# Patient Record
Sex: Female | Born: 1950 | Race: White | Hispanic: No | Smoking: Former smoker
Health system: Southern US, Community
[De-identification: ages and names within clinical notes are randomized; demographics above are authoritative.]

## PROBLEM LIST (undated history)

## (undated) DIAGNOSIS — H509 Unspecified strabismus: Secondary | ICD-10-CM

## (undated) DIAGNOSIS — M1990 Unspecified osteoarthritis, unspecified site: Secondary | ICD-10-CM

## (undated) DIAGNOSIS — N059 Unspecified nephritic syndrome with unspecified morphologic changes: Secondary | ICD-10-CM

## (undated) DIAGNOSIS — Z8719 Personal history of other diseases of the digestive system: Secondary | ICD-10-CM

## (undated) DIAGNOSIS — F32A Depression, unspecified: Secondary | ICD-10-CM

## (undated) DIAGNOSIS — F419 Anxiety disorder, unspecified: Secondary | ICD-10-CM

## (undated) DIAGNOSIS — Z973 Presence of spectacles and contact lenses: Secondary | ICD-10-CM

## (undated) DIAGNOSIS — Z803 Family history of malignant neoplasm of breast: Secondary | ICD-10-CM

## (undated) DIAGNOSIS — M545 Low back pain, unspecified: Secondary | ICD-10-CM

## (undated) DIAGNOSIS — K219 Gastro-esophageal reflux disease without esophagitis: Secondary | ICD-10-CM

## (undated) DIAGNOSIS — B009 Herpesviral infection, unspecified: Secondary | ICD-10-CM

## (undated) DIAGNOSIS — N390 Urinary tract infection, site not specified: Secondary | ICD-10-CM

## (undated) DIAGNOSIS — F329 Major depressive disorder, single episode, unspecified: Secondary | ICD-10-CM

## (undated) DIAGNOSIS — C4492 Squamous cell carcinoma of skin, unspecified: Secondary | ICD-10-CM

## (undated) DIAGNOSIS — G8929 Other chronic pain: Secondary | ICD-10-CM

## (undated) DIAGNOSIS — M810 Age-related osteoporosis without current pathological fracture: Secondary | ICD-10-CM

## (undated) DIAGNOSIS — H3530 Unspecified macular degeneration: Secondary | ICD-10-CM

## (undated) DIAGNOSIS — C50911 Malignant neoplasm of unspecified site of right female breast: Secondary | ICD-10-CM

## (undated) HISTORY — DX: Family history of malignant neoplasm of breast: Z80.3

## (undated) HISTORY — PX: DILATION AND CURETTAGE OF UTERUS: SHX78

## (undated) HISTORY — DX: Major depressive disorder, single episode, unspecified: F32.9

## (undated) HISTORY — DX: Unspecified macular degeneration: H35.30

## (undated) HISTORY — DX: Age-related osteoporosis without current pathological fracture: M81.0

## (undated) HISTORY — DX: Gastro-esophageal reflux disease without esophagitis: K21.9

## (undated) HISTORY — DX: Unspecified strabismus: H50.9

## (undated) HISTORY — PX: TONSILLECTOMY AND ADENOIDECTOMY: SUR1326

## (undated) HISTORY — DX: Anxiety disorder, unspecified: F41.9

## (undated) HISTORY — PX: UPPER GI ENDOSCOPY: SHX6162

## (undated) HISTORY — DX: Unspecified osteoarthritis, unspecified site: M19.90

## (undated) HISTORY — PX: SQUAMOUS CELL CARCINOMA EXCISION: SHX2433

## (undated) HISTORY — PX: CATARACT EXTRACTION W/ INTRAOCULAR LENS  IMPLANT, BILATERAL: SHX1307

## (undated) HISTORY — PX: WISDOM TOOTH EXTRACTION: SHX21

## (undated) HISTORY — PX: COLONOSCOPY: SHX174

## (undated) HISTORY — DX: Depression, unspecified: F32.A

## (undated) HISTORY — PX: EYE SURGERY: SHX253

---

## 1998-01-05 ENCOUNTER — Other Ambulatory Visit: Admission: RE | Admit: 1998-01-05 | Discharge: 1998-01-05 | Payer: Self-pay | Admitting: Obstetrics and Gynecology

## 1999-09-08 ENCOUNTER — Other Ambulatory Visit: Admission: RE | Admit: 1999-09-08 | Discharge: 1999-09-08 | Payer: Self-pay | Admitting: Obstetrics and Gynecology

## 2000-12-25 ENCOUNTER — Other Ambulatory Visit: Admission: RE | Admit: 2000-12-25 | Discharge: 2000-12-25 | Payer: Self-pay | Admitting: Obstetrics and Gynecology

## 2000-12-27 ENCOUNTER — Encounter: Payer: Self-pay | Admitting: Obstetrics and Gynecology

## 2000-12-27 ENCOUNTER — Encounter: Admission: RE | Admit: 2000-12-27 | Discharge: 2000-12-27 | Payer: Self-pay | Admitting: Obstetrics and Gynecology

## 2005-01-15 ENCOUNTER — Other Ambulatory Visit: Admission: RE | Admit: 2005-01-15 | Discharge: 2005-01-15 | Payer: Self-pay | Admitting: Obstetrics and Gynecology

## 2005-02-16 ENCOUNTER — Encounter: Admission: RE | Admit: 2005-02-16 | Discharge: 2005-02-16 | Payer: Self-pay | Admitting: Obstetrics and Gynecology

## 2005-02-26 ENCOUNTER — Encounter: Admission: RE | Admit: 2005-02-26 | Discharge: 2005-02-26 | Payer: Self-pay | Admitting: Obstetrics and Gynecology

## 2007-02-05 ENCOUNTER — Encounter (INDEPENDENT_AMBULATORY_CARE_PROVIDER_SITE_OTHER): Payer: Self-pay | Admitting: Gastroenterology

## 2007-02-05 ENCOUNTER — Ambulatory Visit (HOSPITAL_COMMUNITY): Admission: RE | Admit: 2007-02-05 | Discharge: 2007-02-05 | Payer: Self-pay | Admitting: Gastroenterology

## 2010-03-08 ENCOUNTER — Encounter: Admission: RE | Admit: 2010-03-08 | Discharge: 2010-03-08 | Payer: Self-pay | Admitting: Obstetrics and Gynecology

## 2010-08-06 ENCOUNTER — Encounter: Payer: Self-pay | Admitting: Obstetrics and Gynecology

## 2010-11-28 NOTE — Op Note (Signed)
NAMEANALLELY, ROSELL            ACCOUNT NO.:  [id number]   MEDICAL RECORD NO.:  [id number]          PATIENT TYPE:  AMB   LOCATION:  ENDO                         FACILITY:  [facility]   PHYSICIAN:  Anselmo Rod, M.D.  DATE OF BIRTH:  02-05-1951   DATE OF PROCEDURE:  02/05/2007  DATE OF DISCHARGE:                               OPERATIVE REPORT   PROCEDURE PERFORMED:  Colonoscopy with snare polypectomy x 2.   ENDOSCOPIST:  Anselmo Rod.   INSTRUMENT USED:  Pentax video colonoscope.   INDICATIONS FOR PROCEDURE:  A 60 year old female undergoing a screening  colonoscopy to rule out colonic polyps, masses, etc.   PREPROCEDURE PREPARATION:  Informed consent was procured from the  patient. The patient fasted for 8 hours prior to the procedure and  prepped with a bottle of magnesium citrate and gallon of NuLYTELY the  night prior to the procedure.  Risks and benefits of the procedure  including a 10% miss rate of cancer and polyps were discussed with the  patient as well.   PREPROCEDURE PHYSICAL:  The patient had stable vital signs.  NECK:  Supple.  CHEST:  Clear to auscultation.  S1/S2 regular.  ABDOMEN:  Soft with normal bowel sounds.   DESCRIPTION OF PROCEDURE:  The patient was placed in the left lateral  decubitus position and sedated with 100 mcg of Fentanyl and 8 mg of  Versed given intravenously in slow incremental doses. Once the patient  was adequately sedated and maintained on low-flow oxygen and continuous  cardiac monitoring, the Pentax video colonoscope was advanced from the  rectum to the cecum.  Two small sessile polyps were removed by hot snare  and one by cold biopsy from the rectum and rectosigmoid colon.  Small  internal hemorrhoids were seen.  The rest exam was unremarkable. The  left colon, transverse colon, right colon, cecum and terminal ileum  appeared normal.   IMPRESSION:  1. Three small sessile polyps removed by hot snare from the rectum,      one  polyp removed by cold biopsies x 2.  2. Small internal hemorrhoids.  3. Otherwise normal exam to the terminal ileum.   RECOMMENDATIONS:  1. Await pathology results.  2. Avoid all nonsteroidals including aspirin for the next 2 weeks.  3. Continue using Colace 200 mg at bedtime for constipation along with      MiraLax 17 grams mixed in water 1-2 times per day if the Colace      does not help.  4. Align 4 mg per day is advised on a daily basis.  5. Outpatient follow-up as need arises in the future.  Repeat      colonoscopy will be planned depending on pathology results.      Anselmo Rod, M.D.  Electronically Signed     JNM/MEDQ  D:  02/05/2007  T:  02/06/2007  Job:  [id number]   cc:   Marcelino Duster L. Vincente Poli, M.D.  Fax: [phone]   Tammy R. Collins [place], M.D.  Fax: [phone]

## 2012-05-09 ENCOUNTER — Other Ambulatory Visit: Payer: Self-pay | Admitting: Obstetrics and Gynecology

## 2013-02-16 ENCOUNTER — Encounter (INDEPENDENT_AMBULATORY_CARE_PROVIDER_SITE_OTHER): Payer: Self-pay | Admitting: Ophthalmology

## 2013-03-24 ENCOUNTER — Encounter (INDEPENDENT_AMBULATORY_CARE_PROVIDER_SITE_OTHER): Payer: BC Managed Care – PPO | Admitting: Ophthalmology

## 2013-03-24 DIAGNOSIS — H43819 Vitreous degeneration, unspecified eye: Secondary | ICD-10-CM

## 2013-03-24 DIAGNOSIS — H3530 Unspecified macular degeneration: Secondary | ICD-10-CM

## 2013-03-24 DIAGNOSIS — H35439 Paving stone degeneration of retina, unspecified eye: Secondary | ICD-10-CM

## 2013-04-28 ENCOUNTER — Other Ambulatory Visit: Payer: Self-pay | Admitting: Gastroenterology

## 2013-04-28 DIAGNOSIS — R1310 Dysphagia, unspecified: Secondary | ICD-10-CM

## 2013-05-13 ENCOUNTER — Ambulatory Visit
Admission: RE | Admit: 2013-05-13 | Discharge: 2013-05-13 | Disposition: A | Discharge status: Home / Self Care | Payer: BC Managed Care – PPO | Source: Ambulatory Visit | Attending: Gastroenterology | Admitting: Gastroenterology

## 2013-05-13 DIAGNOSIS — R1310 Dysphagia, unspecified: Secondary | ICD-10-CM

## 2013-05-25 ENCOUNTER — Other Ambulatory Visit: Payer: Self-pay | Admitting: Obstetrics and Gynecology

## 2013-12-22 ENCOUNTER — Ambulatory Visit (INDEPENDENT_AMBULATORY_CARE_PROVIDER_SITE_OTHER): Payer: BC Managed Care – PPO | Admitting: Surgery

## 2013-12-22 ENCOUNTER — Encounter (INDEPENDENT_AMBULATORY_CARE_PROVIDER_SITE_OTHER): Payer: Self-pay | Admitting: Surgery

## 2013-12-22 VITALS — BP 130/76 | HR 84 | Temp 97.8°F | Ht 64.0 in | Wt 120.0 lb

## 2013-12-22 DIAGNOSIS — L723 Sebaceous cyst: Secondary | ICD-10-CM

## 2013-12-22 DIAGNOSIS — L089 Local infection of the skin and subcutaneous tissue, unspecified: Secondary | ICD-10-CM | POA: Insufficient documentation

## 2013-12-22 NOTE — Progress Notes (Signed)
Chief Complaint:  Infected sebaceous cyst of the left anterior neck  History of Present Illness:  Lisa Yoder is an 63 y.o. female who has had a visible palpable sebaceous cyst of the anterior neck.  This is then attempted to be removed by dermatology on 2 occasions. It is not fixed to the underlying tissue. It is somewhat scarred and required an ellipse. I will probably do this under MAC with local.  Past Medical History  Diagnosis Date  . GERD (gastroesophageal reflux disease)   . Osteoporosis     Past Surgical History  Procedure Laterality Date  . Cesarean section    . Tonsillectomy and adenoidectomy      Current Outpatient Prescriptions  Medication Sig Dispense Refill  . PARoxetine (PAXIL) 10 MG tablet       . sulfamethoxazole-trimethoprim (BACTRIM DS) 800-160 MG per tablet       . temazepam (RESTORIL) 30 MG capsule        No current facility-administered medications for this visit.   Review of patient's allergies indicates no known allergies. Family History  Problem Relation Age of Onset  . Cancer Mother     breast   Social History:   reports that she quit smoking about 38 years ago. Her smoking use included Cigarettes. She smoked 0.00 packs per day. She does not have any smokeless tobacco history on file. She reports that she drinks alcohol. She reports that she does not use illicit drugs.   REVIEW OF SYSTEMS - PERTINENT POSITIVES ONLY: noncontributory  Physical Exam:   Blood pressure 130/76, pulse 84, temperature 97.8 F (36.6 C), height 5\' 4"  (1.626 m), weight 120 lb (54.432 kg). Body mass index is 20.59 kg/(m^2).  Gen:  WDWN white female NAD  Neurological: Alert and oriented to person, place, and time. Motor and sensory function is grossly intact  Head: Normocephalic and atraumatic.  Eyes: Conjunctivae are normal. Pupils are equal, round, and reactive to light. No scleral icterus.  Neck: Normal range of motion. Neck supple. No tracheal deviation or  thyromegaly present. there is a 1 cm nodule in the left neck along the crease that appears to be a partially drained chronically inflamed sebaceous cyst. Skin: Skin is warm and dry. No rash noted. No diaphoresis. No erythema. No pallor. Pscyh: Normal mood and affect. Behavior is normal. Judgment and thought content normal.   LABORATORY RESULTS: No results found for this or any previous visit (from the past 48 hour(s)).  RADIOLOGY RESULTS: No results found.  Problem List: Patient Active Problem List   Diagnosis Date Noted  . 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 Patient currently taking Bactrim. We'll schedule a cone day surgery under local MAC excision of sebaceous cyst.    Matt B. Hassell Done, MD, [facility] [phone] beeper [phone]  12/22/2013 3:26 PM

## 2013-12-22 NOTE — Patient Instructions (Signed)

## 2013-12-28 ENCOUNTER — Inpatient Hospital Stay (HOSPITAL_COMMUNITY)
Admission: EM | Admit: 2013-12-28 | Discharge: 2013-12-31 | DRG: 607 | Disposition: A | Discharge status: Home / Self Care | Payer: BC Managed Care – PPO | Attending: Internal Medicine | Admitting: Internal Medicine

## 2013-12-28 ENCOUNTER — Encounter (HOSPITAL_COMMUNITY): Payer: Self-pay | Admitting: Emergency Medicine

## 2013-12-28 DIAGNOSIS — R300 Dysuria: Secondary | ICD-10-CM | POA: Diagnosis present

## 2013-12-28 DIAGNOSIS — Z79899 Other long term (current) drug therapy: Secondary | ICD-10-CM | POA: Diagnosis not present

## 2013-12-28 DIAGNOSIS — R21 Rash and other nonspecific skin eruption: Secondary | ICD-10-CM | POA: Diagnosis present

## 2013-12-28 DIAGNOSIS — N179 Acute kidney failure, unspecified: Secondary | ICD-10-CM | POA: Diagnosis present

## 2013-12-28 DIAGNOSIS — K219 Gastro-esophageal reflux disease without esophagitis: Secondary | ICD-10-CM | POA: Diagnosis present

## 2013-12-28 DIAGNOSIS — Z882 Allergy status to sulfonamides status: Secondary | ICD-10-CM | POA: Diagnosis not present

## 2013-12-28 DIAGNOSIS — Z87891 Personal history of nicotine dependence: Secondary | ICD-10-CM | POA: Diagnosis not present

## 2013-12-28 DIAGNOSIS — M810 Age-related osteoporosis without current pathological fracture: Secondary | ICD-10-CM | POA: Diagnosis present

## 2013-12-28 DIAGNOSIS — Y92009 Unspecified place in unspecified non-institutional (private) residence as the place of occurrence of the external cause: Secondary | ICD-10-CM | POA: Diagnosis not present

## 2013-12-28 DIAGNOSIS — L519 Erythema multiforme, unspecified: Secondary | ICD-10-CM

## 2013-12-28 DIAGNOSIS — L270 Generalized skin eruption due to drugs and medicaments taken internally: Secondary | ICD-10-CM | POA: Diagnosis present

## 2013-12-28 DIAGNOSIS — R350 Frequency of micturition: Secondary | ICD-10-CM | POA: Diagnosis present

## 2013-12-28 DIAGNOSIS — L0211 Cutaneous abscess of neck: Secondary | ICD-10-CM | POA: Diagnosis present

## 2013-12-28 DIAGNOSIS — L723 Sebaceous cyst: Secondary | ICD-10-CM

## 2013-12-28 DIAGNOSIS — L03221 Cellulitis of neck: Secondary | ICD-10-CM

## 2013-12-28 DIAGNOSIS — T370X5A Adverse effect of sulfonamides, initial encounter: Secondary | ICD-10-CM | POA: Diagnosis present

## 2013-12-28 DIAGNOSIS — L089 Local infection of the skin and subcutaneous tissue, unspecified: Secondary | ICD-10-CM

## 2013-12-28 LAB — CBC WITH DIFFERENTIAL/PLATELET
BASOS PCT: 0 % (ref 0–1)
Basophils Absolute: 0 10*3/uL (ref 0.0–0.1)
Eosinophils Absolute: 0.2 10*3/uL (ref 0.0–0.7)
Eosinophils Relative: 2 % (ref 0–5)
HCT: 43.8 % (ref 36.0–46.0)
Hemoglobin: 14.6 g/dL (ref 12.0–15.0)
LYMPHS ABS: 0.7 10*3/uL (ref 0.7–4.0)
LYMPHS PCT: 6 % — AB (ref 12–46)
MCH: 29.8 pg (ref 26.0–34.0)
MCHC: 33.3 g/dL (ref 30.0–36.0)
MCV: 89.4 fL (ref 78.0–100.0)
Monocytes Absolute: 0.2 10*3/uL (ref 0.1–1.0)
Monocytes Relative: 2 % — ABNORMAL LOW (ref 3–12)
NEUTROS PCT: 90 % — AB (ref 43–77)
Neutro Abs: 11.4 10*3/uL — ABNORMAL HIGH (ref 1.7–7.7)
PLATELETS: 170 10*3/uL (ref 150–400)
RBC: 4.9 MIL/uL (ref 3.87–5.11)
RDW: 12.8 % (ref 11.5–15.5)
WBC: 12.6 10*3/uL — ABNORMAL HIGH (ref 4.0–10.5)

## 2013-12-28 LAB — COMPREHENSIVE METABOLIC PANEL
ALBUMIN: 3.7 g/dL (ref 3.5–5.2)
ALT: 31 U/L (ref 0–35)
AST: 32 U/L (ref 0–37)
Alkaline Phosphatase: 76 U/L (ref 39–117)
BUN: 26 mg/dL — ABNORMAL HIGH (ref 6–23)
CO2: 26 mEq/L (ref 19–32)
Calcium: 9.4 mg/dL (ref 8.4–10.5)
Chloride: 95 mEq/L — ABNORMAL LOW (ref 96–112)
Creatinine, Ser: 1.72 mg/dL — ABNORMAL HIGH (ref 0.50–1.10)
GFR calc Af Amer: 35 mL/min — ABNORMAL LOW (ref >90)
GFR calc non Af Amer: 30 mL/min — ABNORMAL LOW (ref >90)
GLUCOSE: 146 mg/dL — AB (ref 70–99)
Potassium: 4.1 mEq/L (ref 3.7–5.3)
Sodium: 135 mEq/L — ABNORMAL LOW (ref 137–147)
Total Bilirubin: 0.5 mg/dL (ref 0.3–1.2)
Total Protein: 7.6 g/dL (ref 6.0–8.3)

## 2013-12-28 LAB — APTT: aPTT: 38 seconds — ABNORMAL HIGH (ref 24–37)

## 2013-12-28 LAB — I-STAT CG4 LACTIC ACID, ED: LACTIC ACID, VENOUS: 2.69 mmol/L — AB (ref 0.5–2.2)

## 2013-12-28 LAB — PROTIME-INR
INR: 1.27 (ref 0.00–1.49)
Prothrombin Time: 15.6 seconds — ABNORMAL HIGH (ref 11.6–15.2)

## 2013-12-28 MED ORDER — ACETAMINOPHEN 325 MG PO TABS
650.0000 mg | ORAL_TABLET | Freq: Once | ORAL | Status: AC
  Administered 2013-12-28: 650 mg via ORAL
  Filled 2013-12-28: qty 2

## 2013-12-28 MED ORDER — HYDROMORPHONE HCL PF 1 MG/ML IJ SOLN: 1.0000 mg | INTRAMUSCULAR | Status: DC | PRN

## 2013-12-28 MED ORDER — FAMOTIDINE IN NACL 20-0.9 MG/50ML-% IV SOLN
20.0000 mg | Freq: Once | INTRAVENOUS | Status: AC
  Administered 2013-12-28: 20 mg via INTRAVENOUS
  Filled 2013-12-28: qty 50

## 2013-12-28 MED ORDER — OXYCODONE HCL 5 MG PO TABS
5.0000 mg | ORAL_TABLET | ORAL | Status: DC | PRN
  Administered 2013-12-28: 5 mg via ORAL
  Filled 2013-12-28: qty 1

## 2013-12-28 MED ORDER — FAMOTIDINE IN NACL 20-0.9 MG/50ML-% IV SOLN
20.0000 mg | Freq: Two times a day (BID) | INTRAVENOUS | Status: DC
  Administered 2013-12-28 – 2013-12-30 (×6): 20 mg via INTRAVENOUS
  Filled 2013-12-28 (×9): qty 50

## 2013-12-28 MED ORDER — EPINEPHRINE 0.3 MG/0.3ML IJ SOAJ
0.3000 mg | Freq: Once | INTRAMUSCULAR | Status: AC
  Administered 2013-12-28: 0.3 mg via INTRAMUSCULAR

## 2013-12-28 MED ORDER — SODIUM CHLORIDE 0.9 % IV BOLUS (SEPSIS)
1000.0000 mL | Freq: Once | INTRAVENOUS | Status: AC
  Administered 2013-12-28: 1000 mL via INTRAVENOUS

## 2013-12-28 MED ORDER — BIOTENE DRY MOUTH MT LIQD
15.0000 mL | Freq: Two times a day (BID) | OROMUCOSAL | Status: DC
  Administered 2013-12-28 – 2013-12-30 (×5): 15 mL via OROMUCOSAL

## 2013-12-28 MED ORDER — CHLORHEXIDINE GLUCONATE 0.12 % MT SOLN
15.0000 mL | Freq: Two times a day (BID) | OROMUCOSAL | Status: DC
Start: 2013-12-28 — End: 2013-12-31
  Administered 2013-12-28 – 2013-12-30 (×5): 15 mL via OROMUCOSAL
  Filled 2013-12-28 (×8): qty 15

## 2013-12-28 MED ORDER — METHYLPREDNISOLONE SODIUM SUCC 125 MG IJ SOLR
125.0000 mg | Freq: Once | INTRAMUSCULAR | Status: AC
  Administered 2013-12-28: 125 mg via INTRAVENOUS
  Filled 2013-12-28: qty 2

## 2013-12-28 MED ORDER — SODIUM CHLORIDE 0.9 % IV SOLN
Freq: Once | INTRAVENOUS | Status: AC
  Administered 2013-12-28: 11:00:00 via INTRAVENOUS

## 2013-12-28 MED ORDER — ONDANSETRON HCL 4 MG/2ML IJ SOLN: 4.0000 mg | Freq: Four times a day (QID) | INTRAMUSCULAR | Status: DC | PRN

## 2013-12-28 MED ORDER — METHYLPREDNISOLONE SODIUM SUCC 125 MG IJ SOLR: 60.0000 mg | Freq: Two times a day (BID) | INTRAMUSCULAR | Status: DC

## 2013-12-28 MED ORDER — ONDANSETRON HCL 4 MG PO TABS: 4.0000 mg | ORAL_TABLET | Freq: Four times a day (QID) | ORAL | Status: DC | PRN

## 2013-12-28 MED ORDER — SODIUM CHLORIDE 0.9 % IV SOLN
INTRAVENOUS | Status: DC
  Administered 2013-12-28: 13:00:00 via INTRAVENOUS
  Administered 2013-12-29: 1000 mL via INTRAVENOUS
  Administered 2013-12-29 (×2): via INTRAVENOUS

## 2013-12-28 MED ORDER — DIPHENHYDRAMINE HCL 50 MG/ML IJ SOLN
25.0000 mg | Freq: Once | INTRAMUSCULAR | Status: AC
  Administered 2013-12-28: 25 mg via INTRAVENOUS
  Filled 2013-12-28: qty 1

## 2013-12-28 MED ORDER — ENOXAPARIN SODIUM 30 MG/0.3ML ~~LOC~~ SOLN
30.0000 mg | SUBCUTANEOUS | Status: DC
Start: 2013-12-28 — End: 2013-12-29
  Administered 2013-12-28: 30 mg via SUBCUTANEOUS
  Filled 2013-12-28 (×2): qty 0.3

## 2013-12-28 MED ORDER — VANCOMYCIN HCL 500 MG IV SOLR
500.0000 mg | INTRAVENOUS | Status: DC
  Administered 2013-12-28: 500 mg via INTRAVENOUS
  Filled 2013-12-28: qty 500

## 2013-12-28 MED ORDER — DIPHENHYDRAMINE HCL 50 MG/ML IJ SOLN
25.0000 mg | Freq: Four times a day (QID) | INTRAMUSCULAR | Status: DC | PRN
  Administered 2013-12-28 (×2): 25 mg via INTRAVENOUS
  Filled 2013-12-28 (×2): qty 1

## 2013-12-28 MED ORDER — METHYLPREDNISOLONE SODIUM SUCC 125 MG IJ SOLR
80.0000 mg | Freq: Four times a day (QID) | INTRAMUSCULAR | Status: DC
  Administered 2013-12-28 – 2013-12-29 (×5): 80 mg via INTRAVENOUS
  Filled 2013-12-28 (×8): qty 1.28

## 2013-12-28 NOTE — Progress Notes (Signed)
ANTIBIOTIC CONSULT NOTE - INITIAL  Pharmacy Consult for vancomycin Indication: abscess/wound infection  No Known Allergies  Patient Measurements: Height: 5\' 4"  (162.6 cm) Weight: 119 lb 14.9 oz (54.4 kg) IBW/kg (Calculated) : 54.7  Vital Signs: Temp: 98.4 F (36.9 C) (06/15 1225) Temp src: Oral (06/15 1225) BP: 105/95 mmHg (06/15 1206) Pulse Rate: 92 (06/15 1206) Intake/Output from previous day:   Intake/Output from this shift: Total I/O In: 1000 [I.V.:1000] Out: -   Labs:  Recent Labs  12/28/13 1022  WBC 12.6*  HGB 14.6  PLT 170  CREATININE 1.72*   Estimated Creatinine Clearance: 28 ml/min (by C-G formula based on Cr of 1.72). No results found for this basename: VANCOTROUGH, VANCOPEAK, VANCORANDOM, GENTTROUGH, GENTPEAK, GENTRANDOM, TOBRATROUGH, TOBRAPEAK, TOBRARND, AMIKACINPEAK, AMIKACINTROU, AMIKACIN,  in the last 72 hours   Microbiology: No results found for this or any previous visit (from the past 720 hour(s)).  Medical History: Past Medical History  Diagnosis Date  . GERD (gastroesophageal reflux disease)   . Osteoporosis     Medications:  Scheduled:  . antiseptic oral rinse  15 mL Mouth Rinse q12n4p  . chlorhexidine  15 mL Mouth Rinse BID  . enoxaparin (LOVENOX) injection  30 mg Subcutaneous Q24H  . famotidine (PEPCID) IV  20 mg Intravenous Q12H  . methylPREDNISolone (SOLU-MEDROL) injection  80 mg Intravenous Q6H   Infusions:  . sodium chloride     Assessment: 63 yo with recent Bactrim (last dose 6/14 AM) use due to abscess on neck but developed rash yesterday that has worsened overnight despite Benadryl/HC cream. Patient has maculopapular rash in upper extremities and rash with target lesions on lower extremities with areas of petechiae - concern for TEN/erythema multiform or early Stevens-Johnson syndrome  Tmax 100.3  WBC 12.6  SCr 1.72 with estimated CrCl of 28 ml/min  Goal of Therapy:  Vancomycin trough level 10-15 mcg/ml  Plan:  1)  Vancomycin 500mg  IV q24 2) Will check vanc trough at steady state as necessary  Adrian Saran, PharmD, BCPS Pager [phone] 12/28/2013 1:11 PM

## 2013-12-28 NOTE — H&P (Addendum)
[facility] History and Physical  Lisa Yoder RFX:[id number] DOB: 10/09/1950 DOA: 12/28/2013  Referring physician: ED physician PCP: Florina Ou, MD   Chief Complaint: diffuse rash   HPI:  Pt is 63 yo female who presented to [facility] ED with main concern of one day duration of progressively worsening diffuse body rash, associated with itching, hives, fever, discomfort, restlessness. Pt explains she was treated with ABX Bactrim for neck abscess 4 days prior to this admission and has actually felt better but suddenly developed uncomfortable rash, which now involves face, neck, entire body. Pt denies any known allergies, no similar events in the past. She took last ABX yesterday AM.  In ED, pt noted to have severe rash with target like lesions. Physical exam findings worrisome for acute drug reaction adn [facility] asked to admit for further evaluation.   Assessment and Plan: Active Problems: Acute drug reaction, skin rash - admit to medical floor - start with providing supportive care with IVF, analgesia, antihistamines, steroids - also ensure documented allergy to bactrim, sulfa - keep meds IV for now Neck abscess - site looks good, clean with mild surrounding erythema - place on Vancomycin for now  Acute renal failure - likely pre renal - provide IVF and repeat BMP in AM Leukocytosis - from acute illness - repeat CBC in AM  Radiological Exams on Admission: No results found.  Code Status: Full Family Communication: Pt and husband at bedside Disposition Plan: Admit for further evaluation     Review of Systems:  Constitutional:  Negative for diaphoresis.  HENT: Negative for hearing loss, ear pain, nosebleeds, congestion, sore throat, neck pain, tinnitus and ear discharge.   Eyes: Negative for blurred vision, double vision, photophobia, pain, discharge and redness.  Respiratory: Negative for cough, hemoptysis, sputum production, shortness of breath, wheezing and stridor.    Cardiovascular: Negative for chest pain, palpitations, orthopnea, claudication and leg swelling.  Gastrointestinal:  Negative for heartburn, constipation, blood in stool and melena.  Genitourinary: Negative for dysuria, urgency, frequency, hematuria and flank pain.  Musculoskeletal: Negative for myalgias, back pain, joint pain and falls.  Skin: Per HPI  Neurological: Negative for sensory change, speech change, focal weakness, loss of consciousness and headaches.  Endo/Heme/Allergies: Negative for environmental allergies and polydipsia. Does not bruise/bleed easily.  Psychiatric/Behavioral: Negative for suicidal ideas. The patient is not nervous/anxious.      Past Medical History  Diagnosis Date  . GERD (gastroesophageal reflux disease)   . Osteoporosis     Past Surgical History  Procedure Laterality Date  . Cesarean section    . Tonsillectomy and adenoidectomy      Social History:  reports that she quit smoking about 38 years ago. Her smoking use included Cigarettes. She smoked 0.00 packs per day. She has never used smokeless tobacco. She reports that she drinks alcohol. She reports that she does not use illicit drugs.  No Known Allergies  Family History  Problem Relation Age of Onset  . Cancer Mother     breast    Prior to Admission medications   Medication Sig Start Date End Date Taking? Authorizing Provider  Calcium Carbonate-Vitamin D (CALCIUM PLUS VITAMIN D PO) Take by mouth.   Yes Historical Provider, MD  Cholecalciferol (VITAMIN D PO) Take by mouth.   Yes Historical Provider, MD  Digestive Enzymes (DIGESTIVE ENZYME PO) Take by mouth.   Yes Historical Provider, MD  Multiple Vitamins-Minerals (PRESERVISION AREDS) CAPS Take 1 capsule by mouth daily.   Yes Historical Provider, MD  Omega-3 Fatty Acids (OMEGA 3 PO) Take by mouth.   Yes Historical Provider, MD  OVER THE COUNTER MEDICATION "Progesterone--OTC."   Yes Historical Provider, MD  PARoxetine (PAXIL) 10 MG tablet  Take 10 mg by mouth daily.  10/14/13  Yes Historical Provider, MD  sulfamethoxazole-trimethoprim (BACTRIM DS) 800-160 MG per tablet Take 1 tablet by mouth 2 (two) times daily. For 7 days. 12/21/13  Yes Historical Provider, MD  temazepam (RESTORIL) 30 MG capsule Take 30 mg by mouth at bedtime as needed for sleep.  12/21/13  Yes Historical Provider, MD    Physical Exam: Filed Vitals:   12/28/13 1030 12/28/13 1045 12/28/13 1100 12/28/13 1115  BP: 115/89 115/66 117/85 116/66  Pulse:   96 97  Temp:      TempSrc:      Resp: 13 14 14 23   SpO2:  100% 97% 99%    Physical Exam  Constitutional: Appears well-developed and well-nourished. No distress.  HENT: Normocephalic. External right and left ear normal. Dry MM Eyes: Conjunctivae and EOM are normal. PERRLA, no scleral icterus.  Neck: Normal ROM. Neck supple. No JVD. No tracheal deviation. No thyromegaly.  CVS: RRR, S1/S2 +, no murmurs, no gallops, no carotid bruit.  Pulmonary: Effort and breath sounds normal, no stridor, rhonchi, wheezes, rales.  Abdominal: Soft. BS +,  no distension, tenderness, rebound or guarding.  Musculoskeletal: Normal range of motion. No edema and no tenderness.  Lymphadenopathy: No lymphadenopathy noted, cervical, inguinal. Neuro: Alert. Normal reflexes, muscle tone coordination. No cranial nerve deficit. Skin: Diffuse macular rash, with target like lesions in the abdominal area, no blisters, no discharge noted Psychiatric: Normal mood and affect. Behavior, judgment, thought content normal.   Labs on Admission:  Basic Metabolic Panel:  Recent Labs Lab 12/28/13 1022  NA 135*  K 4.1  CL 95*  CO2 26  GLUCOSE 146*  BUN 26*  CREATININE 1.72*  CALCIUM 9.4   Liver Function Tests:  Recent Labs Lab 12/28/13 1022  AST 32  ALT 31  ALKPHOS 76  BILITOT 0.5  PROT 7.6  ALBUMIN 3.7   CBC:  Recent Labs Lab 12/28/13 1022  WBC 12.6*  NEUTROABS 11.4*  HGB 14.6  HCT 43.8  MCV 89.4  PLT 170   EKG: Normal sinus  rhythm, no ST/T wave changes  Faye Ramsay, MD  [facility] Pager [phone]  If 7PM-7AM, please contact night-coverage [email or web address] Password [facility] 12/28/2013, 11:41 AM

## 2013-12-28 NOTE — ED Provider Notes (Addendum)
CSN: [id number]     Arrival date & time 12/28/13  1008 History   First MD Initiated Contact with Patient 12/28/13 1017     Chief Complaint  Patient presents with  . Allergic Reaction     (Consider location/radiation/quality/duration/timing/severity/associated sxs/prior Treatment) Patient is a 63 y.o. female presenting with rash. The history is provided by the patient.  Rash Location:  Full body Quality: itchiness, redness and swelling   Severity:  Severe Onset quality:  Gradual Duration:  1 day Timing:  Constant Progression:  Worsening Chronicity:  New Context comment:  On bactrim for the last 4 days for neck abscess Relieved by:  Antihistamines, anti-itch cream and topical steroids Worsened by:  Nothing tried Ineffective treatments:  Topical steroids, antihistamines and anti-itch cream Associated symptoms: fever, headaches and periorbital edema   Associated symptoms: no hoarse voice, no shortness of breath, no tongue swelling and not wheezing   Associated symptoms comment:  Nausea. Feels like a lump in the throat   Past Medical History  Diagnosis Date  . GERD (gastroesophageal reflux disease)   . Osteoporosis    Past Surgical History  Procedure Laterality Date  . Cesarean section    . Tonsillectomy and adenoidectomy     Family History  Problem Relation Age of Onset  . Cancer Mother     breast   History  Substance Use Topics  . Smoking status: Former Smoker    Types: Cigarettes    Quit date: 12/23/1975  . Smokeless tobacco: Not on file  . Alcohol Use: Yes   OB History   Grav Para Term Preterm Abortions TAB SAB Ect Mult Living                 Review of Systems  Constitutional: Positive for fever.  HENT: Negative for hoarse voice.   Respiratory: Negative for shortness of breath and wheezing.   Skin: Positive for rash.  Neurological: Positive for headaches.  All other systems reviewed and are negative.     Allergies  Review of patient's allergies  indicates no known allergies.  Home Medications   Prior to Admission medications   Medication Sig Start Date End Date Taking? Authorizing Provider  PARoxetine (PAXIL) 10 MG tablet  10/14/13   Historical Provider, MD  sulfamethoxazole-trimethoprim (BACTRIM DS) 800-160 MG per tablet  12/21/13   Historical Provider, MD  temazepam (RESTORIL) 30 MG capsule  12/21/13   Historical Provider, MD   BP 110/65  Pulse 99  Temp(Src) 100.3 F (37.9 C) (Rectal)  Resp 15  SpO2 99% Physical Exam  Nursing note and vitals reviewed. Constitutional: She is oriented to person, place, and time. She appears well-developed and well-nourished. No distress.  HENT:  Head: Normocephalic and atraumatic.  Mouth/Throat: Oropharynx is clear and moist. Mucous membranes are dry.  No lip peeling, sloughing or rashes.  No oral lesions  Eyes: EOM are normal. Pupils are equal, round, and reactive to light. Right eye exhibits no discharge. Left eye exhibits no discharge. Right conjunctiva is injected. Left conjunctiva is injected.  Mild swelling of bilateral upper eyelids  Neck: Normal range of motion. Neck supple.  Cardiovascular: Normal rate, regular rhythm and intact distal pulses.   No murmur heard. Pulmonary/Chest: Effort normal and breath sounds normal. No respiratory distress. She has no wheezes. She has no rales.  Abdominal: Soft. She exhibits no distension. There is no tenderness. There is no rebound and no guarding.  Musculoskeletal: Normal range of motion. She exhibits no edema and no tenderness.  Neurological: She is alert and oriented to person, place, and time.  Skin: Skin is warm and dry. Rash noted. Rash is maculopapular. No erythema.  Pt has maculopapular rash that blanches in the neck and upper trunk which transitions to a rash on the abd and lower legs that looks like target lesions with petechia  Psychiatric: She has a normal mood and affect. Her behavior is normal.    ED Course  Procedures (including  critical care time) Labs Review Labs Reviewed  CBC WITH DIFFERENTIAL - Abnormal; Notable for the following:    WBC 12.6 (*)    Neutrophils Relative % 90 (*)    Neutro Abs 11.4 (*)    Lymphocytes Relative 6 (*)    Monocytes Relative 2 (*)    All other components within normal limits  COMPREHENSIVE METABOLIC PANEL - Abnormal; Notable for the following:    Sodium 135 (*)    Chloride 95 (*)    Glucose, Bld 146 (*)    BUN 26 (*)    Creatinine, Ser 1.72 (*)    GFR calc non Af Amer 30 (*)    GFR calc Af Amer 35 (*)    All other components within normal limits  PROTIME-INR - Abnormal; Notable for the following:    Prothrombin Time 15.6 (*)    All other components within normal limits  APTT - Abnormal; Notable for the following:    aPTT 38 (*)    All other components within normal limits  I-STAT CG4 LACTIC ACID, ED - Abnormal; Notable for the following:    Lactic Acid, Venous 2.69 (*)    All other components within normal limits    Imaging Review No results found.   EKG Interpretation None      MDM   Final diagnoses:  Erythema multiforme   Pt with hx of recent bactrim use due to abscess on the neck.  Yesterday developed rash that has worsened overnight despite benadryl and topical hydrocortisone cream.  Has had nausea, itching and headache.  Today sx worsening and call PCP and told to come here.  Pt given epi 50min prior to me seeing the pt.  She feels jittery but does not see difference in sx.  Pt has a maculopapular rash in the upper extremities and a rash that looks more like target lesions the lower extremities with areas of petechiae.  Patient has a low-grade fever and conjunctivitis.  Concern for TEN/Erythemamultiform or early SJS.  Pt given benadryl/steroids/pepcid.  Given IVF.  CBC, lactate, CMP, coags pending.  11:18 AM Pt with mild elevated lactate, new AKI and mild elevation of PT.  Feel pt needs admission for close observation adn treatment.  Blanchie Dessert,  MD 12/28/13 [place], MD 12/28/13 1119

## 2013-12-28 NOTE — ED Notes (Addendum)
Pt reports taking sulfameth for cyst on neck since 6/08. Pt reports being in the sun Saturday and noticing some itching. Pt reports generalized rash Sunday but was going to wait to see her PCP today. Pt came here due to difficulty swallowing this am.

## 2013-12-29 DIAGNOSIS — R21 Rash and other nonspecific skin eruption: Secondary | ICD-10-CM

## 2013-12-29 LAB — BASIC METABOLIC PANEL
BUN: 21 mg/dL (ref 6–23)
CALCIUM: 8.2 mg/dL — AB (ref 8.4–10.5)
CHLORIDE: 109 meq/L (ref 96–112)
CO2: 20 meq/L (ref 19–32)
Creatinine, Ser: 0.98 mg/dL (ref 0.50–1.10)
GFR calc Af Amer: 69 mL/min — ABNORMAL LOW (ref >90)
GFR calc non Af Amer: 59 mL/min — ABNORMAL LOW (ref >90)
GLUCOSE: 152 mg/dL — AB (ref 70–99)
Potassium: 3.9 mEq/L (ref 3.7–5.3)
SODIUM: 140 meq/L (ref 137–147)

## 2013-12-29 LAB — URINALYSIS, ROUTINE W REFLEX MICROSCOPIC
BILIRUBIN URINE: NEGATIVE
Glucose, UA: NEGATIVE mg/dL
Hgb urine dipstick: NEGATIVE
Ketones, ur: NEGATIVE mg/dL
LEUKOCYTES UA: NEGATIVE
NITRITE: NEGATIVE
PH: 6.5 (ref 5.0–8.0)
Protein, ur: NEGATIVE mg/dL
SPECIFIC GRAVITY, URINE: 1.007 (ref 1.005–1.030)
Urobilinogen, UA: 0.2 mg/dL (ref 0.0–1.0)

## 2013-12-29 LAB — PROTIME-INR
INR: 1.29 (ref 0.00–1.49)
Prothrombin Time: 15.8 seconds — ABNORMAL HIGH (ref 11.6–15.2)

## 2013-12-29 LAB — CBC
HEMATOCRIT: 31.5 % — AB (ref 36.0–46.0)
Hemoglobin: 10.4 g/dL — ABNORMAL LOW (ref 12.0–15.0)
MCH: 29.9 pg (ref 26.0–34.0)
MCHC: 33 g/dL (ref 30.0–36.0)
MCV: 90.5 fL (ref 78.0–100.0)
Platelets: 121 10*3/uL — ABNORMAL LOW (ref 150–400)
RBC: 3.48 MIL/uL — ABNORMAL LOW (ref 3.87–5.11)
RDW: 13.3 % (ref 11.5–15.5)
WBC: 8.6 10*3/uL (ref 4.0–10.5)

## 2013-12-29 LAB — APTT: aPTT: 46 seconds — ABNORMAL HIGH (ref 24–37)

## 2013-12-29 MED ORDER — CYCLOSPORINE 0.05 % OP EMUL
1.0000 [drp] | Freq: Three times a day (TID) | OPHTHALMIC | Status: DC
  Administered 2013-12-29 – 2013-12-30 (×6): 1 [drp] via OPHTHALMIC
  Filled 2013-12-29 (×9): qty 1

## 2013-12-29 MED ORDER — ENOXAPARIN SODIUM 40 MG/0.4ML ~~LOC~~ SOLN
40.0000 mg | SUBCUTANEOUS | Status: DC
  Administered 2013-12-29 – 2013-12-30 (×2): 40 mg via SUBCUTANEOUS
  Filled 2013-12-29 (×3): qty 0.4

## 2013-12-29 MED ORDER — TEMAZEPAM 15 MG PO CAPS
30.0000 mg | ORAL_CAPSULE | Freq: Every evening | ORAL | Status: DC | PRN
  Administered 2013-12-29 – 2013-12-30 (×3): 30 mg via ORAL
  Filled 2013-12-29 (×3): qty 2

## 2013-12-29 MED ORDER — VANCOMYCIN HCL IN DEXTROSE 1-5 GM/200ML-% IV SOLN
1000.0000 mg | INTRAVENOUS | Status: DC
  Administered 2013-12-29 – 2013-12-30 (×2): 1000 mg via INTRAVENOUS
  Filled 2013-12-29 (×3): qty 200

## 2013-12-29 MED ORDER — PHENOL 1.4 % MT LIQD
1.0000 | OROMUCOSAL | Status: DC | PRN
  Filled 2013-12-29: qty 177

## 2013-12-29 MED ORDER — METHYLPREDNISOLONE SODIUM SUCC 40 MG IJ SOLR
40.0000 mg | Freq: Two times a day (BID) | INTRAMUSCULAR | Status: DC
  Administered 2013-12-30: 40 mg via INTRAVENOUS
  Filled 2013-12-29 (×3): qty 1

## 2013-12-29 MED ORDER — DIPHENHYDRAMINE-ZINC ACETATE 2-0.1 % EX CREA
TOPICAL_CREAM | Freq: Every day | CUTANEOUS | Status: DC | PRN
  Filled 2013-12-29: qty 28

## 2013-12-29 NOTE — Progress Notes (Signed)
Patient ID: Lisa Yoder, female   DOB: [birth year], 63 y.o.   MRN: [id number]  [facility] PROGRESS NOTE  Lisa Yoder NUU:[id number] DOB: 1950/08/15 DOA: 12/28/2013 PCP: Florina Ou, MD  Brief narrative: Pt is 63 yo female who presented to [facility] ED with main concern of one day duration of progressively worsening diffuse body rash, associated with itching, hives, fever, discomfort, restlessness. Pt explains she was treated with ABX Bactrim for neck abscess 4 days prior to this admission and has actually felt better but suddenly developed uncomfortable rash, which now involves face, neck, entire body. Pt denies any known allergies, no similar events in the past. She took last ABX yesterday AM.   In ED, pt noted to have severe rash with target like lesions. Physical exam findings worrisome for acute drug reaction adn [facility] asked to admit for further evaluation.   Assessment and Plan:  Active Problems:  Acute drug reaction, skin rash  - continue providing supportive care with IVF, analgesia, antihistamines, steroids as pt is responding well  - also ensure documented allergy to bactrim, sulfa  - keep meds IV for now and transition to PO in AM Urinary frequency and dysuria - since this AM - will ask for UA and urine culture  - WBC trending down and WNL this AM  Neck abscess  - site looks good, clean with mild surrounding erythema  - placed on Vancomycin for now  - possibly transition to oral clinda in am  Acute renal failure  - likely pre renal  - provided IVF and Cr is now WNL Leukocytosis  - from acute illness  - resolved    Consultants:  None  Procedures/Studies:  None  Antibiotics:  Vancomycin 6/16 -->  Code Status: Full Family Communication: Pt at bedside Disposition Plan: Home when medically stable  HPI/Subjective: No events overnight.   Objective: Filed Vitals:   12/28/13 1256 12/28/13 1305 12/28/13 2132 12/29/13 0521  BP:  101/46 100/43 95/46  Pulse:   88 76 66  Temp:  99.1 F (37.3 C) 98.6 F (37 C) 97.8 F (36.6 C)  TempSrc:  Oral Oral Oral  Resp:  16 18 14   Height: 5\' 4"  (1.626 m) 5\' 4"  (1.626 m)    Weight: 54.4 kg (119 lb 14.9 oz) 53.978 kg (119 lb)    SpO2:  99% 97% 96%    Intake/Output Summary (Last 24 hours) at 12/29/13 1419 Last data filed at 12/29/13 0900  Gross per 24 hour  Intake 2421.67 ml  Output      0 ml  Net 2421.67 ml    Exam:   General:  Pt is alert, follows commands appropriately, not in acute distress  Cardiovascular: Regular rate and rhythm, S1/S2, no murmurs, no rubs, no gallops  Respiratory: Clear to auscultation bilaterally, no wheezing, no crackles, no rhonchi  Abdomen: Soft, non tender, non distended, bowel sounds present, no guarding  Extremities: No edema, pulses DP and PT palpable bilaterally  Skin: Diffuse macular rash involving the entire body, better today  Data Reviewed: Basic Metabolic Panel:  Recent Labs Lab 12/28/13 1022 12/29/13 0335  NA 135* 140  K 4.1 3.9  CL 95* 109  CO2 26 20  GLUCOSE 146* 152*  BUN 26* 21  CREATININE 1.72* 0.98  CALCIUM 9.4 8.2*   Liver Function Tests:  Recent Labs Lab 12/28/13 1022  AST 32  ALT 31  ALKPHOS 76  BILITOT 0.5  PROT 7.6  ALBUMIN 3.7   CBC:  Recent Labs  Lab 12/28/13 1022 12/29/13 0335  WBC 12.6* 8.6  NEUTROABS 11.4*  --   HGB 14.6 10.4*  HCT 43.8 31.5*  MCV 89.4 90.5  PLT 170 121*    Scheduled Meds: . antiseptic oral rinse  15 mL Mouth Rinse q12n4p  . chlorhexidine  15 mL Mouth Rinse BID  . cycloSPORINE  1 drop Both Eyes TID  . enoxaparin (LOVENOX) injection  40 mg Subcutaneous Q24H  . famotidine (PEPCID) IV  20 mg Intravenous Q12H  . methylPREDNISolone (SOLU-MEDROL) injection  80 mg Intravenous Q6H  . vancomycin  1,000 mg Intravenous Q24H   Continuous Infusions: . sodium chloride 1,000 mL (12/29/13 1148)    Faye Ramsay, MD  [facility] Pager [phone]  If 7PM-7AM, please contact  night-coverage [email or web address] Password [email or web address] 12/29/2013, 2:19 PM   LOS: 1 day

## 2013-12-29 NOTE — Progress Notes (Signed)
ANTIBIOTIC CONSULT NOTE - Follow Up  Pharmacy Consult for vancomycin Indication: abscess/wound infection  Allergies  Allergen Reactions  . Bactrim [Sulfamethoxazole-Tmp Ds] Hives and Rash    Patient Measurements: Height: 5\' 4"  (162.6 cm) Weight: 119 lb (53.978 kg) IBW/kg (Calculated) : 54.7  Vital Signs: Temp: 97.8 F (36.6 C) (06/16 0521) Temp src: Oral (06/16 0521) BP: 95/46 mmHg (06/16 0521) Pulse Rate: 66 (06/16 0521) Intake/Output from previous day: 06/15 0701 - 06/16 0700 In: 3421.7 [P.O.:600; I.V.:2671.7; IV Piggyback:150] Out: -  Intake/Output from this shift:    Labs:  Recent Labs  12/28/13 1022 12/29/13 0335  WBC 12.6* 8.6  HGB 14.6 10.4*  PLT 170 121*  CREATININE 1.72* 0.98   Estimated Creatinine Clearance: 48.8 ml/min (by C-G formula based on Cr of 0.98). No results found for this basename: VANCOTROUGH, VANCOPEAK, VANCORANDOM, GENTTROUGH, GENTPEAK, GENTRANDOM, TOBRATROUGH, TOBRAPEAK, TOBRARND, AMIKACINPEAK, AMIKACINTROU, AMIKACIN,  in the last 72 hours   Microbiology: No results found for this or any previous visit (from the past 720 hour(s)).  Medical History: Past Medical History  Diagnosis Date  . GERD (gastroesophageal reflux disease)   . Osteoporosis     Medications:  Scheduled:  . antiseptic oral rinse  15 mL Mouth Rinse q12n4p  . chlorhexidine  15 mL Mouth Rinse BID  . enoxaparin (LOVENOX) injection  30 mg Subcutaneous Q24H  . famotidine (PEPCID) IV  20 mg Intravenous Q12H  . methylPREDNISolone (SOLU-MEDROL) injection  80 mg Intravenous Q6H  . vancomycin  500 mg Intravenous Q24H   Infusions:  . sodium chloride 100 mL/hr at 12/29/13 0058   Assessment: 63 yo with recent Bactrim (last dose 6/14 AM) use due to abscess on neck but developed rash yesterday that has worsened overnight despite Benadryl/HC cream. Patient has maculopapular rash in upper extremities and rash with target lesions on lower extremities with areas of petechiae -  concern for TEN/erythema multiform or early Stevens-Johnson syndrome  6/15 >> vancomycin >>   Tmax: afebrile since initial 100.3 in ER  WBC 8.6, improved  SCr 0.98, improved, with estimated CrCl now of 49 ml/min  Goal of Therapy:  Vancomycin trough level 10-15 mcg/ml  Plan:  1) Due to improvement in SCr, will change vancomycin from 500mg  IV q24 to 1g IV q24 2) Will check vanc trough at steady state as necessary  Adrian Saran, PharmD, BCPS Pager [phone] 12/29/2013 8:52 AM

## 2013-12-30 ENCOUNTER — Other Ambulatory Visit: Payer: Self-pay

## 2013-12-30 DIAGNOSIS — L519 Erythema multiforme, unspecified: Secondary | ICD-10-CM

## 2013-12-30 LAB — TSH: TSH: 1.71 u[IU]/mL (ref 0.350–4.500)

## 2013-12-30 LAB — CBC
HEMATOCRIT: 31.9 % — AB (ref 36.0–46.0)
HEMOGLOBIN: 10.5 g/dL — AB (ref 12.0–15.0)
MCH: 29.8 pg (ref 26.0–34.0)
MCHC: 32.9 g/dL (ref 30.0–36.0)
MCV: 90.6 fL (ref 78.0–100.0)
Platelets: 163 10*3/uL (ref 150–400)
RBC: 3.52 MIL/uL — ABNORMAL LOW (ref 3.87–5.11)
RDW: 13.7 % (ref 11.5–15.5)
WBC: 9.7 10*3/uL (ref 4.0–10.5)

## 2013-12-30 MED ORDER — PREDNISONE 50 MG PO TABS
50.0000 mg | ORAL_TABLET | Freq: Every day | ORAL | Status: DC
  Administered 2013-12-30: 50 mg via ORAL
  Filled 2013-12-30 (×3): qty 1

## 2013-12-30 NOTE — Progress Notes (Signed)
Reassessed pt's VS HR 52, BP 102/57, RR 20, 02 sat on RA. Pt shows no signs of cardiovascular distress. Radial pulse is strong and regular. Baltazar Najjar NP notified of updated VS. Noreene Larsson RN, BSN

## 2013-12-30 NOTE — Progress Notes (Signed)
At 2335 pt's HR was 47. Pt's HR has been trending down from the 80s at admission to 50 this afternoon. This RN assessed pt's radial pulse of 48. Baltazar Najjar NP, was notified and a STAT EKG was obtained showing ventricular rate of 47 and sinus bradycardia with sinus arrhythmia. Pt not currently on telemetry. Order received to recheck vitals signs in 1 hour and notify Baltazar Najjar of the results. This RN to continue to monitor. Noreene Larsson RN, BSN

## 2013-12-30 NOTE — Progress Notes (Signed)
12/30/13 1400  Clinical Encounter Type  Visited With Patient and family together (father Rush Landmark, daughter Denny Peon)  Visit Type Spiritual support;Social support  Referral From Family  Spiritual Encounters  Spiritual Needs Emotional   Made visit for spiritual and emotional support, providing space for Salisha to process her story of and feelings about her allergic reaction, with an eye toward helping her away from guilt and "should haves" into more productive reflection.  She appreciated affirmation and encouragement.    Jerome, [place]

## 2013-12-30 NOTE — Progress Notes (Signed)
PROGRESS NOTE  Lisa Yoder:[id number] DOB: Apr 02, 1951 DOA: 12/28/2013 PCP: Florina Ou, MD  HPI: Pt is 63 yo female who presented to [facility] ED with main concern of one day duration of progressively worsening diffuse body rash, associated with itching, hives, fever, discomfort, restlessness. Pt explains she was treated with ABX Bactrim for neck abscess 4 days prior to this admission and has actually felt better but suddenly developed uncomfortable rash, which now involves face, neck, entire body. Pt denies any known allergies, no similar events in the past. She took last ABX yesterday AM. In ED, pt noted to have severe rash with target like lesions. Physical exam findings worrisome for acute drug reaction adn [facility] asked to admit for further evaluation.   Assessment/Plan: Acute drug reaction, skin rash  - continue providing supportive care with IVF, analgesia, antihistamines, steroids as pt is responding well  - also ensure documented allergy to bactrim, sulfa  - keep meds IV for now and transition to PO in AM   Urinary frequency and dysuria  - UA without evidence of infection  Neck abscess  - site looks good, clean with mild surrounding erythema  - placed on Vancomycin for now  - possibly transition to oral clinda in am   Acute renal failure  - likely pre renal and use of Bactrim - provided IVF and Cr is now WNL   Leukocytosis  - from acute illness  - resolved   Asymptomatic bradycardia - when sleeping, patient very active exercising 5 days a week, suspect physiologic baseline low heart rate.   Diet: regular Fluids: none DVT Prophylaxis: lovenox  Code Status: Full Family Communication: d/w patient  Disposition Plan: home when ready   Consultants:  None   Procedures:  None    Antibiotics Vancomycin 6/16 >>  HPI/Subjective: No complaints, denies chest pain  Objective: Filed Vitals:   12/29/13 2235 12/29/13 2338 12/30/13 0154 12/30/13 0603  BP: 100/51   102/57 115/58  Pulse: 46 48 52 53  Temp: 96.2 F (35.7 C)   97.6 F (36.4 C)  TempSrc: Axillary   Oral  Resp: 16  20 20   Height:      Weight:      SpO2: 98%  98% 93%    Intake/Output Summary (Last 24 hours) at 12/30/13 1238 Last data filed at 12/30/13 0929  Gross per 24 hour  Intake   2930 ml  Output      0 ml  Net   2930 ml   Filed Weights   12/28/13 1256 12/28/13 1305  Weight: 54.4 kg (119 lb 14.9 oz) 53.978 kg (119 lb)   Exam:  General:  NAD  Cardiovascular: regular rate and rhythm, without MRG  Respiratory: good air movement, clear to auscultation throughout, no wheezing, ronchi or rales  Abdomen: soft, not tender to palpation, positive bowel sounds  MSK: no peripheral edema  Skin: diffuse macular rash on legs, abdomen,   Neuro: non focal  Data Reviewed: Basic Metabolic Panel:  Recent Labs Lab 12/28/13 1022 12/29/13 0335  NA 135* 140  K 4.1 3.9  CL 95* 109  CO2 26 20  GLUCOSE 146* 152*  BUN 26* 21  CREATININE 1.72* 0.98  CALCIUM 9.4 8.2*   Liver Function Tests:  Recent Labs Lab 12/28/13 1022  AST 32  ALT 31  ALKPHOS 76  BILITOT 0.5  PROT 7.6  ALBUMIN 3.7   CBC:  Recent Labs Lab 12/28/13 1022 12/29/13 0335 12/30/13 0807  WBC 12.6* 8.6 9.7  NEUTROABS 11.4*  --   --   HGB 14.6 10.4* 10.5*  HCT 43.8 31.5* 31.9*  MCV 89.4 90.5 90.6  PLT 170 121* 163   Studies: No results found.  Scheduled Meds: . antiseptic oral rinse  15 mL Mouth Rinse q12n4p  . chlorhexidine  15 mL Mouth Rinse BID  . cycloSPORINE  1 drop Both Eyes TID  . enoxaparin (LOVENOX) injection  40 mg Subcutaneous Q24H  . famotidine (PEPCID) IV  20 mg Intravenous Q12H  . predniSONE  50 mg Oral Q breakfast  . vancomycin  1,000 mg Intravenous Q24H   Continuous Infusions:   Active Problems:   Rash  Time spent: 25  This note has been created with Surveyor, quantity. Any transcriptional errors are unintentional.    Marzetta Board, MD [facility] Pager [phone]. If 7 PM - 7 AM, please contact night-coverage at [email or web address], password [facility] 12/30/2013, 12:38 PM  LOS: 2 days

## 2013-12-31 LAB — URINE CULTURE
COLONY COUNT: NO GROWTH
CULTURE: NO GROWTH

## 2013-12-31 MED ORDER — PREDNISONE 20 MG PO TABS: 40.0000 mg | ORAL_TABLET | Freq: Every day | ORAL | Status: DC

## 2013-12-31 MED ORDER — CYCLOSPORINE 0.05 % OP EMUL
1.0000 [drp] | Freq: Three times a day (TID) | OPHTHALMIC | Status: DC
Start: 2013-12-31 — End: 2014-02-15

## 2013-12-31 MED ORDER — DIPHENHYDRAMINE-ZINC ACETATE 2-0.1 % EX CREA: TOPICAL_CREAM | Freq: Every day | CUTANEOUS | Status: DC | PRN

## 2013-12-31 NOTE — Discharge Instructions (Signed)

## 2013-12-31 NOTE — Progress Notes (Signed)
Pt. Was discharged home she was given her discharge instructions, prescriptions, and all questions were answered. She is to be transported home by her husband.

## 2013-12-31 NOTE — Discharge Summary (Signed)
Physician Discharge Summary  Lisa Yoder:[id number] DOB: 05/26/1951 DOA: 12/28/2013  PCP: Florina Ou, MD  Admit date: 12/28/2013 Discharge date: 12/31/2013  Time spent: 35 minutes  Recommendations for Outpatient Follow-up:  1. Follow up with PCP in 1 week 2. Follow up with Dermatology in 1 week. Patient already has a dermatologist that she is seeing.  Recommendations for primary care physician for things to follow:  Repeat CBC, CMP  Discharge Diagnoses:  Active Problems:   Infected sebaceous cyst of skin   Rash  Discharge Condition: stable  Diet recommendation: regular  Filed Weights   12/28/13 1256 12/28/13 1305  Weight: 54.4 kg (119 lb 14.9 oz) 53.978 kg (119 lb)   History of present illness:  Pt is 63 yo female who presented to [facility] ED with main concern of one day duration of progressively worsening diffuse body rash, associated with itching, hives, fever, discomfort, restlessness. Pt explains she was treated with ABX Bactrim for neck abscess 4 days prior to this admission and has actually felt better but suddenly developed uncomfortable rash, which now involves face, neck, entire body. Pt denies any known allergies, no similar events in the past. She took last ABX yesterday AM. In ED, pt noted to have severe rash with target like lesions. Physical exam findings worrisome for acute drug reaction adn [facility] asked to admit for further evaluation.   Hospital Course:  Acute drug reaction, skin rash - continue providing supportive care with IVF, analgesia, antihistamines, steroids as pt is responding well, rash improving, still present on discharge but clearing up. Patient has a dermatologist that she is seeing on a regular basis, will call today to schedule an appointment. Advised her to avoid sun exposure. She will be discharged on a prednisone taper. We documented allergy to bactrim, sulfa  Urinary frequency and dysuria - UA without evidence of infection  Neck abscess - site  looks good, clean without surrounding erythema, she has completed a total of 7 days of antibiotics, 4 of Bactrim and 3 of vancomycin, will not continue antibiotics on discharge.  Acute renal failure - likely pre renal and use of Bactrim, provided IVF and Cr is now WNL  Leukocytosis - from acute illness, resolved  Asymptomatic bradycardia - when sleeping, patient very active exercising 5 days a week, suspect physiologic baseline low heart rate.   Procedures:  None    Consultations:  None   Discharge Exam: Filed Vitals:   12/30/13 0603 12/30/13 1358 12/30/13 2207 12/31/13 0528  BP: 115/58 157/77 125/61 123/75  Pulse: 53 60 45 51  Temp: 97.6 F (36.4 C) 97.5 F (36.4 C) 97.3 F (36.3 C) 97.8 F (36.6 C)  TempSrc: Oral Oral  Oral  Resp: 20 18 16 16   Height:      Weight:      SpO2: 93% 98% 98% 94%   General: NAD Cardiovascular: RRR Respiratory: CTA biL  Discharge Instructions     Medication List    STOP taking these medications       sulfamethoxazole-trimethoprim 800-160 MG per tablet  Commonly known as:  BACTRIM DS      TAKE these medications       CALCIUM PLUS VITAMIN D PO  Take by mouth.     cycloSPORINE 0.05 % ophthalmic emulsion  Commonly known as:  RESTASIS  Place 1 drop into both eyes 3 (three) times daily.     DIGESTIVE ENZYME PO  Take by mouth.     diphenhydrAMINE-zinc acetate cream  Commonly  known as:  BENADRYL  Apply topically daily as needed for itching.     OMEGA 3 PO  Take by mouth.     OVER THE COUNTER MEDICATION  "Progesterone--OTC."     PARoxetine 10 MG tablet  Commonly known as:  PAXIL  Take 10 mg by mouth daily.     predniSONE 20 MG tablet  Commonly known as:  DELTASONE  Take 2 tablets (40 mg total) by mouth daily with breakfast. 2 tablets daily for 3 days then 1 tablet daily for 3 days then 0.5 tablets daily for 4 days then stop.     PRESERVISION AREDS Caps  Take 1 capsule by mouth daily.     temazepam 30 MG capsule    Commonly known as:  RESTORIL  Take 30 mg by mouth at bedtime as needed for sleep.     VITAMIN D PO  Take by mouth.           Follow-up Information   Follow up with Florina Ou, MD. Schedule an appointment as soon as possible for a visit in 1 week.   Specialty:  Family Medicine   Contact information:   [address] [place] [address] [phone]       Follow up with Dermatology . Schedule an appointment as soon as possible for a visit in 1 week.      The results of significant diagnostics from this hospitalization (including imaging, microbiology, ancillary and laboratory) are listed below for reference.    Significant Diagnostic Studies: No results found.  Microbiology: Recent Results (from the past 240 hour(s))  URINE CULTURE     Status: None   Collection Time    12/29/13  3:28 PM      Result Value Ref Range Status   Specimen Description URINE, CLEAN CATCH   Final   Special Requests NONE   Final   Culture  Setup Time     Final   Value: 12/30/2013 00:29     Performed at [facility]     Final   Value: NO GROWTH     Performed at [facility]   Culture     Final   Value: NO GROWTH     Performed at [facility]   Report Status 12/31/2013 FINAL   Final     Labs: Basic Metabolic Panel:  Recent Labs Lab 12/28/13 1022 12/29/13 0335  NA 135* 140  K 4.1 3.9  CL 95* 109  CO2 26 20  GLUCOSE 146* 152*  BUN 26* 21  CREATININE 1.72* 0.98  CALCIUM 9.4 8.2*   Liver Function Tests:  Recent Labs Lab 12/28/13 1022  AST 32  ALT 31  ALKPHOS 76  BILITOT 0.5  PROT 7.6  ALBUMIN 3.7   CBC:  Recent Labs Lab 12/28/13 1022 12/29/13 0335 12/30/13 0807  WBC 12.6* 8.6 9.7  NEUTROABS 11.4*  --   --   HGB 14.6 10.4* 10.5*  HCT 43.8 31.5* 31.9*  MCV 89.4 90.5 90.6  PLT 170 121* 163   Signed:  GHERGHE, COSTIN  [facility] 12/31/2013, 3:10 PM

## 2014-02-13 DIAGNOSIS — N390 Urinary tract infection, site not specified: Secondary | ICD-10-CM

## 2014-02-13 HISTORY — DX: Urinary tract infection, site not specified: N39.0

## 2014-02-15 ENCOUNTER — Emergency Department (HOSPITAL_COMMUNITY): Payer: BC Managed Care – PPO

## 2014-02-15 ENCOUNTER — Encounter (HOSPITAL_COMMUNITY): Payer: Self-pay | Admitting: Emergency Medicine

## 2014-02-15 ENCOUNTER — Emergency Department (HOSPITAL_COMMUNITY)
Admission: EM | Admit: 2014-02-15 | Discharge: 2014-02-15 | Disposition: A | Discharge status: Home / Self Care | Payer: BC Managed Care – PPO | Attending: Emergency Medicine | Admitting: Emergency Medicine

## 2014-02-15 DIAGNOSIS — M810 Age-related osteoporosis without current pathological fracture: Secondary | ICD-10-CM | POA: Insufficient documentation

## 2014-02-15 DIAGNOSIS — R339 Retention of urine, unspecified: Secondary | ICD-10-CM

## 2014-02-15 DIAGNOSIS — K219 Gastro-esophageal reflux disease without esophagitis: Secondary | ICD-10-CM | POA: Insufficient documentation

## 2014-02-15 DIAGNOSIS — M5431 Sciatica, right side: Secondary | ICD-10-CM

## 2014-02-15 DIAGNOSIS — Z79899 Other long term (current) drug therapy: Secondary | ICD-10-CM | POA: Insufficient documentation

## 2014-02-15 DIAGNOSIS — M5430 Sciatica, unspecified side: Secondary | ICD-10-CM | POA: Insufficient documentation

## 2014-02-15 DIAGNOSIS — Z87891 Personal history of nicotine dependence: Secondary | ICD-10-CM | POA: Insufficient documentation

## 2014-02-15 LAB — CBC
HCT: 41.7 % (ref 36.0–46.0)
Hemoglobin: 13.9 g/dL (ref 12.0–15.0)
MCH: 29.7 pg (ref 26.0–34.0)
MCHC: 33.3 g/dL (ref 30.0–36.0)
MCV: 89.1 fL (ref 78.0–100.0)
Platelets: 251 10*3/uL (ref 150–400)
RBC: 4.68 MIL/uL (ref 3.87–5.11)
RDW: 13.9 % (ref 11.5–15.5)
WBC: 6.2 10*3/uL (ref 4.0–10.5)

## 2014-02-15 LAB — BASIC METABOLIC PANEL
ANION GAP: 16 — AB (ref 5–15)
BUN: 14 mg/dL (ref 6–23)
CO2: 23 mEq/L (ref 19–32)
CREATININE: 0.83 mg/dL (ref 0.50–1.10)
Calcium: 10.2 mg/dL (ref 8.4–10.5)
Chloride: 102 mEq/L (ref 96–112)
GFR calc non Af Amer: 72 mL/min — ABNORMAL LOW (ref >90)
GFR, EST AFRICAN AMERICAN: 84 mL/min — AB (ref >90)
Glucose, Bld: 94 mg/dL (ref 70–99)
Potassium: 3.6 mEq/L — ABNORMAL LOW (ref 3.7–5.3)
Sodium: 141 mEq/L (ref 137–147)

## 2014-02-15 LAB — URINALYSIS, ROUTINE W REFLEX MICROSCOPIC
BILIRUBIN URINE: NEGATIVE
Glucose, UA: NEGATIVE mg/dL
HGB URINE DIPSTICK: NEGATIVE
KETONES UR: NEGATIVE mg/dL
Leukocytes, UA: NEGATIVE
NITRITE: NEGATIVE
Protein, ur: NEGATIVE mg/dL
Specific Gravity, Urine: 1.011 (ref 1.005–1.030)
UROBILINOGEN UA: 0.2 mg/dL (ref 0.0–1.0)
pH: 7.5 (ref 5.0–8.0)

## 2014-02-15 MED ORDER — MORPHINE SULFATE 4 MG/ML IJ SOLN: 4.0000 mg | Freq: Once | INTRAMUSCULAR | Status: DC

## 2014-02-15 MED ORDER — DIAZEPAM 5 MG PO TABS
5.0000 mg | ORAL_TABLET | Freq: Once | ORAL | Status: AC
  Administered 2014-02-15: 5 mg via ORAL
  Filled 2014-02-15: qty 1

## 2014-02-15 MED ORDER — DIAZEPAM 5 MG PO TABS: 5.0000 mg | ORAL_TABLET | Freq: Three times a day (TID) | ORAL | Status: DC | PRN

## 2014-02-15 MED ORDER — MORPHINE SULFATE 4 MG/ML IJ SOLN
4.0000 mg | Freq: Once | INTRAMUSCULAR | Status: AC
  Administered 2014-02-15: 4 mg via INTRAVENOUS
  Filled 2014-02-15: qty 1

## 2014-02-15 MED ORDER — OXYCODONE-ACETAMINOPHEN 5-325 MG PO TABS: 1.0000 | ORAL_TABLET | Freq: Four times a day (QID) | ORAL | Status: DC | PRN

## 2014-02-15 MED ORDER — HYDROMORPHONE HCL PF 1 MG/ML IJ SOLN
1.0000 mg | Freq: Once | INTRAMUSCULAR | Status: AC
  Administered 2014-02-15: 1 mg via INTRAVENOUS
  Filled 2014-02-15: qty 1

## 2014-02-15 MED ORDER — OXYCODONE-ACETAMINOPHEN 5-325 MG PO TABS
2.0000 | ORAL_TABLET | Freq: Once | ORAL | Status: AC
  Administered 2014-02-15: 2 via ORAL
  Filled 2014-02-15: qty 2

## 2014-02-15 NOTE — ED Notes (Signed)
Pt to MRI

## 2014-02-15 NOTE — Discharge Instructions (Signed)
Sciatica °Sciatica is pain, weakness, numbness, or tingling along the path of the sciatic nerve. The nerve starts in the lower back and runs down the back of each leg. The nerve controls the muscles in the lower leg and in the back of the knee, while also providing sensation to the back of the thigh, lower leg, and the sole of your foot. Sciatica is a symptom of another medical condition. For instance, nerve damage or certain conditions, such as a herniated disk or bone spur on the spine, pinch or put pressure on the sciatic nerve. This causes the pain, weakness, or other sensations normally associated with sciatica. Generally, sciatica only affects one side of the body. °CAUSES  °· Herniated or slipped disc. °· Degenerative disk disease. °· A pain disorder involving the narrow muscle in the buttocks (piriformis syndrome). °· Pelvic injury or fracture. °· Pregnancy. °· Tumor (rare). °SYMPTOMS  °Symptoms can vary from mild to very severe. The symptoms usually travel from the low back to the buttocks and down the back of the leg. Symptoms can include: °· Mild tingling or dull aches in the lower back, leg, or hip. °· Numbness in the back of the calf or sole of the foot. °· Burning sensations in the lower back, leg, or hip. °· Sharp pains in the lower back, leg, or hip. °· Leg weakness. °· Severe back pain inhibiting movement. °These symptoms may get worse with coughing, sneezing, laughing, or prolonged sitting or standing. Also, being overweight may worsen symptoms. °DIAGNOSIS  °Your caregiver will perform a physical exam to look for common symptoms of sciatica. He or she may ask you to do certain movements or activities that would trigger sciatic nerve pain. Other tests may be performed to find the cause of the sciatica. These may include: °· Blood tests. °· X-rays. °· Imaging tests, such as an MRI or CT scan. °TREATMENT  °Treatment is directed at the cause of the sciatic pain. Sometimes, treatment is not necessary  and the pain and discomfort goes away on its own. If treatment is needed, your caregiver may suggest: °· Over-the-counter medicines to relieve pain. °· Prescription medicines, such as anti-inflammatory medicine, muscle relaxants, or narcotics. °· Applying heat or ice to the painful area. °· Steroid injections to lessen pain, irritation, and inflammation around the nerve. °· Reducing activity during periods of pain. °· Exercising and stretching to strengthen your abdomen and improve flexibility of your spine. Your caregiver may suggest losing weight if the extra weight makes the back pain worse. °· Physical therapy. °· Surgery to eliminate what is pressing or pinching the nerve, such as a bone spur or part of a herniated disk. °HOME CARE INSTRUCTIONS  °· Only take over-the-counter or prescription medicines for pain or discomfort as directed by your caregiver. °· Apply ice to the affected area for 20 minutes, 3-4 times a day for the first 48-72 hours. Then try heat in the same way. °· Exercise, stretch, or perform your usual activities if these do not aggravate your pain. °· Attend physical therapy sessions as directed by your caregiver. °· Keep all follow-up appointments as directed by your caregiver. °· Do not wear high heels or shoes that do not provide proper support. °· Check your mattress to see if it is too soft. A firm mattress may lessen your pain and discomfort. °SEEK IMMEDIATE MEDICAL CARE IF:  °· You lose control of your bowel or bladder (incontinence). °· You have increasing weakness in the lower back, pelvis, buttocks,   or legs.  You have redness or swelling of your back.  You have a burning sensation when you urinate.  You have pain that gets worse when you lie down or awakens you at night.  Your pain is worse than you have experienced in the past.  Your pain is lasting longer than 4 weeks.  You are suddenly losing weight without reason. MAKE SURE YOU:  Understand these  instructions.  Will watch your condition.  Will get help right away if you are not doing well or get worse. Document Released: 06/26/2001 Document Revised: 01/01/2012 Document Reviewed: 11/11/2011 [facility] Patient Information 2015 [place], [place]. This information is not intended to replace advice given to you by your health care provider. Make sure you discuss any questions you have with your health care provider.  Acute Urinary Retention Acute urinary retention is the temporary inability to urinate. This is an uncommon problem in women. It can be caused by:  Infection.  A side effect of a medicine.  A problem in a nearby organ that presses or squeezes on the bladder or the urethra (the tube that drains the bladder).  Psychological problems.   Surgery on your bladder, urethra, or pelvic organs that causes obstruction to the outflow of urine from your bladder. HOME CARE INSTRUCTIONS  If you are sent home with a Foley catheter and a drainage system, you will need to discuss the best course of action with your health care provider. While the catheter is in, maintain a good intake of fluids. Keep the drainage bag emptied and lower than your catheter. This is so that contaminated urine will not flow back into your bladder, which could lead to a urinary tract infection. There are two main types of drainage bags. One is a large bag that usually is used at night. It has a good capacity that will allow you to sleep through the night without having to empty it. The second type is called a leg bag. It has a smaller capacity so it needs to be emptied more frequently. However, the main advantage is that it can be attached by a leg strap and goes underneath your clothing, allowing you the freedom to move about or leave your home. Only take over-the-counter or prescription medicines for pain, discomfort, or fever as directed by your health care provider.  SEEK MEDICAL CARE IF:  You develop a low-grade  fever.  You experience spasms or leakage of urine with the spasms. SEEK IMMEDIATE MEDICAL CARE IF:   You develop chills or fever.  Your catheter stops draining urine.  Your catheter falls out.  You start to develop increased bleeding that does not respond to rest and increased fluid intake. MAKE SURE YOU:  Understand these instructions.  Will watch your condition.  Will get help right away if you are not doing well or get worse. Document Released: 07/01/2006 Document Revised: 04/22/2013 Document Reviewed: 12/11/2012 [facility] Patient Information 2015 [place], [place]. This information is not intended to replace advice given to you by your health care provider. Make sure you discuss any questions you have with your health care provider.

## 2014-02-15 NOTE — ED Notes (Signed)
Pt back from MRI 

## 2014-02-15 NOTE — ED Provider Notes (Signed)
CSN: [id number]     Arrival date & time 02/15/14  1037 History   First MD Initiated Contact with Patient 02/15/14 1045     Chief Complaint  Patient presents with  . Back Pain  . Urinary Retention     (Consider location/radiation/quality/duration/timing/severity/associated sxs/prior Treatment) Patient is a 63 y.o. female presenting with back pain. The history is provided by the patient.  Back Pain Location:  Lumbar spine Quality:  Aching and stabbing Radiates to:  Does not radiate Pain severity:  Severe Pain is:  Same all the time Onset quality:  Gradual Duration:  2 days Timing:  Constant Progression:  Worsening Chronicity:  New Context: lifting heavy objects (was lifting in the yard recently)   Context: not falling, not physical stress and not recent illness   Relieved by:  Being still Worsened by:  Twisting and movement Associated symptoms: no fever     Past Medical History  Diagnosis Date  . GERD (gastroesophageal reflux disease)   . Osteoporosis    Past Surgical History  Procedure Laterality Date  . Cesarean section    . Tonsillectomy and adenoidectomy     Family History  Problem Relation Age of Onset  . Cancer Mother     breast   History  Substance Use Topics  . Smoking status: Former Smoker    Types: Cigarettes    Quit date: 12/23/1975  . Smokeless tobacco: Never Used  . Alcohol Use: Yes   OB History   Grav Para Term Preterm Abortions TAB SAB Ect Mult Living                 Review of Systems  Constitutional: Negative for fever and chills.  Respiratory: Negative for cough and shortness of breath.   Musculoskeletal: Positive for back pain.  All other systems reviewed and are negative.     Allergies  Bactrim  Home Medications   Prior to Admission medications   Medication Sig Start Date End Date Taking? Authorizing Provider  Calcium Carbonate-Vitamin D (CALCIUM PLUS VITAMIN D PO) Take by mouth.   Yes Historical Provider, MD  Cholecalciferol  (VITAMIN D PO) Take by mouth.   Yes Historical Provider, MD  Coconut Oil OIL Take 5 mg by mouth daily.   Yes Historical Provider, MD  Digestive Enzymes (DIGESTIVE ENZYME PO) Take by mouth.   Yes Historical Provider, MD  fluconazole (DIFLUCAN) 100 MG tablet Take 100 mg by mouth See admin instructions. Take two tablets by mouth on day 1, then one talbet every day for 13 days.   Yes Historical Provider, MD  Multiple Vitamins-Minerals (PRESERVISION AREDS) CAPS Take 1 capsule by mouth daily.   Yes Historical Provider, MD  Omega-3 Fatty Acids (OMEGA 3 PO) Take by mouth.   Yes Historical Provider, MD  PARoxetine (PAXIL) 10 MG tablet Take 10 mg by mouth daily.  10/14/13  Yes Historical Provider, MD  polyvinyl alcohol (LIQUIFILM TEARS) 1.4 % ophthalmic solution Place 1-2 drops into both eyes as needed for dry eyes.   Yes Historical Provider, MD  temazepam (RESTORIL) 30 MG capsule Take 30 mg by mouth at bedtime as needed for sleep.  12/21/13  Yes Historical Provider, MD  diphenhydrAMINE-zinc acetate (BENADRYL) cream Apply topically daily as needed for itching. 12/31/13   Costin Karlyne Greenspan, MD   BP 137/61  Pulse 72  Temp(Src) 98.4 F (36.9 C) (Oral)  Resp 16  SpO2 95% Physical Exam  Nursing note and vitals reviewed. Constitutional: She is oriented to person, place, and  time. She appears well-developed and well-nourished. No distress.  HENT:  Head: Normocephalic and atraumatic.  Mouth/Throat: Oropharynx is clear and moist.  Eyes: EOM are normal. Pupils are equal, round, and reactive to light.  Neck: Normal range of motion. Neck supple.  Cardiovascular: Normal rate and regular rhythm.  Exam reveals no friction rub.   No murmur heard. Pulmonary/Chest: Effort normal and breath sounds normal. No respiratory distress. She has no wheezes. She has no rales.  Abdominal: Soft. She exhibits no distension. There is no tenderness. There is no rebound.  Musculoskeletal: She exhibits no edema.       Lumbar back: She  exhibits decreased range of motion and tenderness (R lower lumbar). She exhibits no bony tenderness, no swelling and no edema.  Neurological: She is alert and oriented to person, place, and time. No cranial nerve deficit or sensory deficit. GCS eye subscore is 4. GCS verbal subscore is 5. GCS motor subscore is 6.  Reflex Scores:      Patellar reflexes are 2+ on the right side and 2+ on the left side. Skin: She is not diaphoretic.    ED Course  Procedures (including critical care time) Labs Review Labs Reviewed  CBC  BASIC METABOLIC PANEL    Imaging Review Mr Thoracic Spine Wo Contrast  02/15/2014   CLINICAL DATA:  Severe back and bilateral leg pain. Evaluate for cord compression.  EXAM: MRI THORACIC SPINE WITHOUT CONTRAST  TECHNIQUE: Multiplanar, multisequence MR imaging of the thoracic spine was performed. No intravenous contrast was administered.  COMPARISON:  None.  FINDINGS: There is mild thoracic levoscoliosis. There is no listhesis. Vertebral body heights are preserved without compression fracture. Small T11 inferior endplate Schmorl's node is noted. Mild degenerative marrow changes are noted anteriorly at the T10-11 disc space. Multilevel anterior/lateral endplate osteophyte formation is noted in the mid and lower thoracic spine. The thoracic spinal cord is normal in caliber and signal. Conus medullaris terminates at L1. Paraspinal soft tissues are unremarkable.  Prominent left-sided facet arthrosis is noted at C7-T1, T1-2, and T2-3. There is a small central disc extrusion at T3-4 which may contact the left ventral spinal cord without evidence of mass effect. Tiny central disc protrusions are noted at T4-5 and T6-7. Minimal disc bulging is noted at T11-12 and T12-L1. There is no thoracic spinal canal stenosis.  IMPRESSION: Mild thoracic levoscoliosis and spondylosis, most notable for a small disc extrusion at T3-4. No spinal stenosis or cord compression.   Electronically Signed   By: Logan Bores   On: 02/15/2014 16:52   Mr Lumbar Spine Wo Contrast  02/15/2014   CLINICAL DATA:  63 year old female acute onset of intense low back pain. Initial encounter.  EXAM: MRI LUMBAR SPINE WITHOUT CONTRAST  TECHNIQUE: Multiplanar, multisequence MR imaging of the lumbar spine was performed. No intravenous contrast was administered.  COMPARISON:  None.  FINDINGS: The bladder is large in distended. Otherwise negative visualized pelvic viscera. Negative visualized abdominal viscera.  Lumbar segmentation appears to be normal and will be designated as such for this report. Vertebral height and alignment preserved. No acute osseous abnormality identified. Intermittent degenerative endplate changes, including at L2-L3 and L5-S1.  Visualized lower thoracic spinal cord is normal with conus medularis at L1.  T12-L1:  Mild disc bulge.  L1-L2:  Mild disc bulge.  L2-L3: Disc space loss. Circumferential disc bulge. Mild facet and ligament flavum hypertrophy. Small right foraminal disc protrusion best seen on sagittal series 4, image 5. Mild to moderate right L2  foraminal stenosis.  L3-L4: Negative disc. Moderate facet and ligament flavum hypertrophy. Trace right facet joint fluid. No stenosis.  L4-L5: Mild foraminal disc bulging. Mild to moderate facet and ligament flavum hypertrophy. Trace right facet joint fluid. No stenosis.  L5-S1: Disc space loss. Circumferential disc osteophyte complex. Mild facet hypertrophy. No spinal stenosis. No significant lateral recess stenosis. Moderate left and mild right L5 foraminal stenosis (series 4, image 11 on the left).  Visible sacrum intact.  IMPRESSION: 1. No acute osseous abnormality or spinal stenosis in the lumbar spine. 2. Intermittent disc, endplate, and facet degeneration. There is a small disc herniation in the right L2-L3 neural foramen resulting in moderate right L2 foraminal stenosis. There is multifactorial moderate left L5 foraminal stenosis. 3. Distended bladder.    Electronically Signed   By: Lars Pinks M.D.   On: 02/15/2014 13:58     EKG Interpretation None      MDM   Final diagnoses:  Right sided sciatica  Urinary retention    63 year old female. Here for right lower back pain. Began yesterday, gradually worsening we'll do some lawn work. No relief go to her swimming. Unable to get out of bed today. Also unable to be this morning due to pain. Here she was unable to pee for [place] either. Post for residual showed almost 1000 cc of urine the bladder. Patient having some fluctuations in pain, decreased pain when I examined her, but very severe this morning. She has normal reflexes and denies any saddle anesthesia. She's also had some difficulty with having a bowel movement. On exam, no difficulty with straight leg raise. No palpable tenderness. With urinary retention and back pain, concern for possible cord compression. Will obtain MRI of her L-spine. MRI of L-spine normal. Still retaining urine. Will MRI t-spine. UA negative. Dr. Sabra Heck to follow up MRI T-spine.  Osvaldo Shipper, MD 02/16/14 [phone]

## 2014-02-15 NOTE — ED Notes (Signed)
Pt attempted to urinate on bedpan because she reported she felt like she needed to urinate. Unable to urinate.

## 2014-02-15 NOTE — Progress Notes (Signed)
02/15/14 1700  Clinical Encounter Type  Visited With Patient and family together (husband [name])  Visit Type ED;Spiritual support;Social support  Referral From Family  Spiritual Encounters  Spiritual Needs Emotional  Stress Factors  Patient Stress Factors Health changes;Loss of control (pain, questions about cause)   Visited with Lisa Yoder and husband [name] per family request to offer spiritual and emotional support.  Pt stressed both because of presenting pain and also proximity of this event in relation to last hospitalization:  "I'm falling apart; I'm going to have to retire early if this [health concerns] keep[s] happening."  Pt very appreciative of pastoral presence and advocacy.    Pierre Part, [place]

## 2014-02-15 NOTE — ED Notes (Signed)
Bed: HE52 Expected date:  Expected time:  Means of arrival:  Comments: Back pain

## 2014-02-15 NOTE — ED Notes (Signed)
Per ems pt is from home. Pt reports she got back from a road trip from [place] on Saturday. Denies pain yesterday, but did yard work moving heavy pots. Felt a twinge in her back. Did yogo and applied heat packs. Reports unale to get up this morning out of bed. Lower lumbar right sided back pain. Initially 10/10 pain. Upon arrival to ED 2/10. Pt requesting to use the bathroom.

## 2014-02-26 ENCOUNTER — Encounter (HOSPITAL_BASED_OUTPATIENT_CLINIC_OR_DEPARTMENT_OTHER): Payer: Self-pay | Admitting: *Deleted

## 2014-02-26 NOTE — Progress Notes (Signed)
Pt has had recent uti, sciatia pain-tx for esophageal yeast from antibiotics from cyst infected-she will let dr Hassell Done know she is on antibiotic now-labs done 02/15/14-ekg-6/15

## 2014-03-03 ENCOUNTER — Encounter (HOSPITAL_BASED_OUTPATIENT_CLINIC_OR_DEPARTMENT_OTHER)
Admission: RE | Disposition: A | Discharge status: Home / Self Care | Payer: Self-pay | Source: Ambulatory Visit | Attending: Surgery

## 2014-03-03 ENCOUNTER — Ambulatory Visit (HOSPITAL_BASED_OUTPATIENT_CLINIC_OR_DEPARTMENT_OTHER): Payer: BC Managed Care – PPO | Admitting: Certified Registered"

## 2014-03-03 ENCOUNTER — Encounter (HOSPITAL_BASED_OUTPATIENT_CLINIC_OR_DEPARTMENT_OTHER): Payer: Self-pay | Admitting: Certified Registered"

## 2014-03-03 ENCOUNTER — Ambulatory Visit (HOSPITAL_BASED_OUTPATIENT_CLINIC_OR_DEPARTMENT_OTHER)
Admission: RE | Admit: 2014-03-03 | Discharge: 2014-03-03 | Disposition: A | Discharge status: Home / Self Care | Payer: BC Managed Care – PPO | Source: Ambulatory Visit | Attending: Surgery | Admitting: Surgery

## 2014-03-03 ENCOUNTER — Encounter (HOSPITAL_BASED_OUTPATIENT_CLINIC_OR_DEPARTMENT_OTHER): Payer: BC Managed Care – PPO | Admitting: Certified Registered"

## 2014-03-03 DIAGNOSIS — R229 Localized swelling, mass and lump, unspecified: Secondary | ICD-10-CM | POA: Diagnosis present

## 2014-03-03 DIAGNOSIS — K219 Gastro-esophageal reflux disease without esophagitis: Secondary | ICD-10-CM | POA: Diagnosis not present

## 2014-03-03 DIAGNOSIS — L578 Other skin changes due to chronic exposure to nonionizing radiation: Secondary | ICD-10-CM | POA: Diagnosis not present

## 2014-03-03 DIAGNOSIS — L723 Sebaceous cyst: Secondary | ICD-10-CM | POA: Insufficient documentation

## 2014-03-03 DIAGNOSIS — Z803 Family history of malignant neoplasm of breast: Secondary | ICD-10-CM | POA: Insufficient documentation

## 2014-03-03 DIAGNOSIS — M810 Age-related osteoporosis without current pathological fracture: Secondary | ICD-10-CM | POA: Insufficient documentation

## 2014-03-03 HISTORY — PX: CYST REMOVAL NECK: SHX6281

## 2014-03-03 HISTORY — DX: Presence of spectacles and contact lenses: Z97.3

## 2014-03-03 HISTORY — DX: Urinary tract infection, site not specified: N39.0

## 2014-03-03 SURGERY — EXCISION, CYST, NECK
Anesthesia: Monitor Anesthesia Care | Site: Neck | Laterality: Left

## 2014-03-03 MED ORDER — PROPOFOL INFUSION 10 MG/ML OPTIME
INTRAVENOUS | Status: DC | PRN
  Administered 2014-03-03: 50 ug/kg/min via INTRAVENOUS

## 2014-03-03 MED ORDER — ONDANSETRON HCL 4 MG/2ML IJ SOLN
INTRAMUSCULAR | Status: DC | PRN
  Administered 2014-03-03: 4 mg via INTRAVENOUS

## 2014-03-03 MED ORDER — CEFAZOLIN SODIUM-DEXTROSE 2-3 GM-% IV SOLR
2.0000 g | INTRAVENOUS | Status: AC
  Administered 2014-03-03: 2 g via INTRAVENOUS

## 2014-03-03 MED ORDER — HEPARIN SODIUM (PORCINE) 5000 UNIT/ML IJ SOLN
5000.0000 [IU] | Freq: Once | INTRAMUSCULAR | Status: AC
  Administered 2014-03-03: 5000 [IU] via SUBCUTANEOUS

## 2014-03-03 MED ORDER — MIDAZOLAM HCL 2 MG/2ML IJ SOLN
INTRAMUSCULAR | Status: AC
  Filled 2014-03-03: qty 2

## 2014-03-03 MED ORDER — FENTANYL CITRATE 0.05 MG/ML IJ SOLN
INTRAMUSCULAR | Status: DC | PRN
  Administered 2014-03-03: 25 ug via INTRAVENOUS

## 2014-03-03 MED ORDER — CEFAZOLIN SODIUM-DEXTROSE 2-3 GM-% IV SOLR
INTRAVENOUS | Status: AC
  Filled 2014-03-03: qty 50

## 2014-03-03 MED ORDER — LIDOCAINE HCL (CARDIAC) 20 MG/ML IV SOLN
INTRAVENOUS | Status: DC | PRN
  Administered 2014-03-03: 60 mg via INTRAVENOUS

## 2014-03-03 MED ORDER — FENTANYL CITRATE 0.05 MG/ML IJ SOLN
INTRAMUSCULAR | Status: AC
  Filled 2014-03-03: qty 2

## 2014-03-03 MED ORDER — LIDOCAINE-EPINEPHRINE (PF) 1 %-1:200000 IJ SOLN
INTRAMUSCULAR | Status: DC | PRN
  Administered 2014-03-03: 3 mL

## 2014-03-03 MED ORDER — FENTANYL CITRATE 0.05 MG/ML IJ SOLN: 50.0000 ug | INTRAMUSCULAR | Status: DC | PRN

## 2014-03-03 MED ORDER — LIDOCAINE-EPINEPHRINE (PF) 1 %-1:200000 IJ SOLN
INTRAMUSCULAR | Status: AC
  Filled 2014-03-03: qty 10

## 2014-03-03 MED ORDER — MIDAZOLAM HCL 2 MG/2ML IJ SOLN: 1.0000 mg | INTRAMUSCULAR | Status: DC | PRN

## 2014-03-03 MED ORDER — LACTATED RINGERS IV SOLN
INTRAVENOUS | Status: DC
  Administered 2014-03-03: 08:00:00 via INTRAVENOUS

## 2014-03-03 MED ORDER — SODIUM BICARBONATE 4 % IV SOLN
INTRAVENOUS | Status: DC | PRN
  Administered 2014-03-03: 5 mL via INTRAVENOUS

## 2014-03-03 MED ORDER — PROPOFOL 10 MG/ML IV EMUL
INTRAVENOUS | Status: AC
  Filled 2014-03-03: qty 50

## 2014-03-03 MED ORDER — SODIUM BICARBONATE 4 % IV SOLN
INTRAVENOUS | Status: AC
  Filled 2014-03-03: qty 5

## 2014-03-03 MED ORDER — HYDROMORPHONE HCL PF 1 MG/ML IJ SOLN: 0.2500 mg | INTRAMUSCULAR | Status: DC | PRN

## 2014-03-03 MED ORDER — BUPIVACAINE HCL (PF) 0.5 % IJ SOLN: INTRAMUSCULAR | Status: DC | PRN

## 2014-03-03 MED ORDER — BUPIVACAINE HCL (PF) 0.5 % IJ SOLN
INTRAMUSCULAR | Status: AC
  Filled 2014-03-03: qty 30

## 2014-03-03 MED ORDER — CHLORHEXIDINE GLUCONATE 4 % EX LIQD: 1.0000 "application " | Freq: Once | CUTANEOUS | Status: DC

## 2014-03-03 MED ORDER — HEPARIN SODIUM (PORCINE) 5000 UNIT/ML IJ SOLN
INTRAMUSCULAR | Status: AC
  Filled 2014-03-03: qty 1

## 2014-03-03 SURGICAL SUPPLY — 47 items
ADH SKN CLS APL DERMABOND .7 (GAUZE/BANDAGES/DRESSINGS)
APL SKNCLS STERI-STRIP NONHPOA (GAUZE/BANDAGES/DRESSINGS)
BENZOIN TINCTURE PRP APPL 2/3 (GAUZE/BANDAGES/DRESSINGS) IMPLANT
BLADE CLIPPER SURG (BLADE) IMPLANT
BLADE SURG 15 STRL LF DISP TIS (BLADE) ×1 IMPLANT
BLADE SURG 15 STRL SS (BLADE) ×2
CANISTER SUCT 1200ML W/VALVE (MISCELLANEOUS) IMPLANT
CLEANER CAUTERY TIP 5X5 PAD (MISCELLANEOUS) ×1 IMPLANT
COVER MAYO STAND STRL (DRAPES) ×2 IMPLANT
COVER TABLE BACK 60X90 (DRAPES) ×2 IMPLANT
DECANTER SPIKE VIAL GLASS SM (MISCELLANEOUS) ×1 IMPLANT
DERMABOND ADVANCED (GAUZE/BANDAGES/DRESSINGS)
DERMABOND ADVANCED .7 DNX12 (GAUZE/BANDAGES/DRESSINGS) IMPLANT
DRAPE PED LAPAROTOMY (DRAPES) IMPLANT
DRAPE U-SHAPE 76X120 STRL (DRAPES) IMPLANT
ELECT REM PT RETURN 9FT ADLT (ELECTROSURGICAL) ×2
ELECTRODE REM PT RTRN 9FT ADLT (ELECTROSURGICAL) ×1 IMPLANT
GLOVE BIO SURGEON STRL SZ8 (GLOVE) ×2 IMPLANT
GLOVE BIOGEL PI IND STRL 7.0 (GLOVE) IMPLANT
GLOVE BIOGEL PI INDICATOR 7.0 (GLOVE) ×1
GLOVE ECLIPSE 6.5 STRL STRAW (GLOVE) ×1 IMPLANT
GOWN STRL REUS W/ TWL LRG LVL3 (GOWN DISPOSABLE) ×1 IMPLANT
GOWN STRL REUS W/ TWL XL LVL3 (GOWN DISPOSABLE) ×1 IMPLANT
GOWN STRL REUS W/TWL LRG LVL3 (GOWN DISPOSABLE) ×2
GOWN STRL REUS W/TWL XL LVL3 (GOWN DISPOSABLE) ×2
NDL HYPO 25X1 1.5 SAFETY (NEEDLE) ×1 IMPLANT
NEEDLE 27GAX1X1/2 (NEEDLE) IMPLANT
NEEDLE HYPO 25X1 1.5 SAFETY (NEEDLE) ×2 IMPLANT
NS IRRIG 1000ML POUR BTL (IV SOLUTION) IMPLANT
PACK BASIN DAY SURGERY FS (CUSTOM PROCEDURE TRAY) ×2 IMPLANT
PAD CLEANER CAUTERY TIP 5X5 (MISCELLANEOUS) ×1
PENCIL BUTTON HOLSTER BLD 10FT (ELECTRODE) ×2 IMPLANT
SPONGE GAUZE 4X4 12PLY STER LF (GAUZE/BANDAGES/DRESSINGS) ×2 IMPLANT
STRIP CLOSURE SKIN 1/2X4 (GAUZE/BANDAGES/DRESSINGS) IMPLANT
SUT ETHILON 3 0 FSL (SUTURE) IMPLANT
SUT ETHILON 5 0 PS 2 18 (SUTURE) IMPLANT
SUT MON AB 5-0 P3 18 (SUTURE) ×1 IMPLANT
SUT VIC AB 4-0 SH 18 (SUTURE) ×2 IMPLANT
SUT VIC AB 5-0 PS2 18 (SUTURE) IMPLANT
SUT VICRYL 3-0 CR8 SH (SUTURE) IMPLANT
SYR BULB 3OZ (MISCELLANEOUS) ×2 IMPLANT
SYR CONTROL 10ML LL (SYRINGE) ×2 IMPLANT
TOWEL OR 17X24 6PK STRL BLUE (TOWEL DISPOSABLE) ×2 IMPLANT
TRAY DSU PREP LF (CUSTOM PROCEDURE TRAY) ×2 IMPLANT
TUBE CONNECTING 20X1/4 (TUBING) IMPLANT
UNDERPAD 30X30 INCONTINENT (UNDERPADS AND DIAPERS) IMPLANT
YANKAUER SUCT BULB TIP NO VENT (SUCTIONS) IMPLANT

## 2014-03-03 NOTE — Discharge Instructions (Signed)
°  Post Anesthesia Home Care Instructions  Activity: Get plenty of rest for the remainder of the day. A responsible adult should stay with you for 24 hours following the procedure.  For the next 24 hours, DO NOT: -Drive a car -Paediatric nurse -Drink alcoholic beverages -Take any medication unless instructed by your physician -Make any legal decisions or sign important papers.  Meals: Start with liquid foods such as gelatin or soup. Progress to regular foods as tolerated. Avoid greasy, spicy, heavy foods. If nausea and/or vomiting occur, drink only clear liquids until the nausea and/or vomiting subsides. Call your physician if vomiting continues.  Special Instructions/Symptoms: Your throat may feel dry or sore from the anesthesia or the breathing tube placed in your throat during surgery. If this causes discomfort, gargle with warm salt water. The discomfort should disappear within 24 hours.  Call your surgeon if you experience:   1.  Fever over 101.0. 2.  Inability to urinate. 3.  Nausea and/or vomiting. 4.  Extreme swelling or bruising at the surgical site. 5.  Continued bleeding from the incision. 6.  Increased pain, redness or drainage from the incision. 7.  Problems related to your pain medication. 8. Any change in color, movement and/or sensation 9. Any problems and/or concerns  May shower and get wet.   Tylenol for pain Resume medications

## 2014-03-03 NOTE — Anesthesia Preprocedure Evaluation (Addendum)
Anesthesia Evaluation  Patient identified by MRN, date of birth, ID band Patient awake    Reviewed: Allergy & Precautions, NPO status   Airway Mallampati: II TM Distance: >3 FB Neck ROM: Full    Dental  (+) Teeth Intact, Dental Advisory Given   Pulmonary former smoker,  breath sounds clear to auscultation        Cardiovascular Rhythm:Regular Rate:Normal     Neuro/Psych    GI/Hepatic GERD-  Medicated and Controlled,  Endo/Other    Renal/GU      Musculoskeletal   Abdominal   Peds  Hematology   Anesthesia Other Findings   Reproductive/Obstetrics                         Anesthesia Physical Anesthesia Plan  ASA: II  Anesthesia Plan: MAC   Post-op Pain Management:    Induction: Intravenous  Airway Management Planned: Simple Face Mask  Additional Equipment:   Intra-op Plan:   Post-operative Plan:   Informed Consent: I have reviewed the patients History and Physical, chart, labs and discussed the procedure including the risks, benefits and alternatives for the proposed anesthesia with the patient or authorized representative who has indicated his/her understanding and acceptance.   Dental advisory given  Plan Discussed with: CRNA  Anesthesia Plan Comments:         Anesthesia Quick Evaluation

## 2014-03-03 NOTE — Transfer of Care (Signed)
Immediate Anesthesia Transfer of Care Note  Patient: Lisa Yoder  Procedure(s) Performed: Procedure(s): LEFT NECK CYST EXCISION (Left)  Patient Location: PACU  Anesthesia Type:MAC  Level of Consciousness: awake, alert , oriented and patient cooperative  Airway & Oxygen Therapy: Patient Spontanous Breathing and Patient connected to face mask oxygen  Post-op Assessment: Report given to PACU RN and Post -op Vital signs reviewed and stable  Post vital signs: Reviewed and stable  Complications: No apparent anesthesia complications

## 2014-03-03 NOTE — Anesthesia Procedure Notes (Signed)
Procedure Name: MAC Date/Time: 03/03/2014 8:30 AM Performed by: Jlee Harkless Pre-anesthesia Checklist: Patient identified, Emergency Drugs available, Suction available, Patient being monitored and Timeout performed Patient Re-evaluated:Patient Re-evaluated prior to inductionOxygen Delivery Method: Simple face mask

## 2014-03-03 NOTE — Op Note (Signed)
Surgeon: Kaylyn Lim, MD, FACS  Asst:  nonel  Anes:  MAC with local  Procedure: Excision of mass of left anterior neck  (1 cm)  Diagnosis: Recurrent infected sebaceous cyst of neck-path pending  Complications: none  EBL:   minimal cc  Drains: none  Description of Procedure:  The patient was taken to OR 2 at [facility].  After anesthesia was administered and the patient was prepped a timeout was performed.  The previously marked area was infiltrated with a mixture of lido with epi and neut.  An ellipse of skin was taken alligned with the skin lines and was sent to path.  The defect was closed with 4-0 vicryl and 5-0 monocryl.  Dermabond was used on the skin.    The patient tolerated the procedure well and was taken to the PACU in stable condition.     Matt B. Hassell Done, [place], [facility], [place]

## 2014-03-03 NOTE — H&P (Signed)
Chief Complaint: Infected sebaceous cyst of the left anterior neck  History of Present Illness: Lisa Yoder is an 63 y.o. female who has had a visible palpable sebaceous cyst of the anterior neck. This is then attempted to be removed by dermatology on 2 occasions. It is not fixed to the underlying tissue. It is somewhat scarred and required an ellipse. I will probably do this under MAC with local.  Past Medical History   Diagnosis  Date   .  GERD (gastroesophageal reflux disease)    .  Osteoporosis     Past Surgical History   Procedure  Laterality  Date   .  Cesarean section     .  Tonsillectomy and adenoidectomy      Current Outpatient Prescriptions   Medication  Sig  Dispense  Refill   .  PARoxetine (PAXIL) 10 MG tablet      .  sulfamethoxazole-trimethoprim (BACTRIM DS) 800-160 MG per tablet      .  temazepam (RESTORIL) 30 MG capsule       No current facility-administered medications for this visit.   Review of patient's allergies indicates no known allergies.  Family History   Problem  Relation  Age of Onset   .  Cancer  Mother      breast   Social History: reports that she quit smoking about 38 years ago. Her smoking use included Cigarettes. She smoked 0.00 packs per day. She does not have any smokeless tobacco history on file. She reports that she drinks alcohol. She reports that she does not use illicit drugs.  REVIEW OF SYSTEMS - PERTINENT POSITIVES ONLY:  noncontributory  Physical Exam:  Blood pressure 130/76, pulse 84, temperature 97.8 F (36.6 C), height 5\' 4"  (1.626 m), weight 120 lb (54.432 kg).  Body mass index is 20.59 kg/(m^2).  Gen: WDWN white female NAD  Neurological: Alert and oriented to person, place, and time. Motor and sensory function is grossly intact  Head: Normocephalic and atraumatic.  Eyes: Conjunctivae are normal. Pupils are equal, round, and reactive to light. No scleral icterus.  Neck: Normal range of motion. Neck supple. No tracheal deviation  or thyromegaly present. there is a 1 cm nodule in the left neck along the crease that appears to be a partially drained chronically inflamed sebaceous cyst.  Skin: Skin is warm and dry. No rash noted. No diaphoresis. No erythema. No pallor. Pscyh: Normal mood and affect. Behavior is normal. Judgment and thought content normal.  LABORATORY RESULTS:  No results found for this or any previous visit (from the past 48 hour(s)).  RADIOLOGY RESULTS:  No results found.  Problem List:  Patient Active Problem List    Diagnosis  Date Noted   .  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 Patient currently taking Bactrim. We'll schedule a cone day surgery under local MAC excision of sebaceous cyst.  Matt B. Hassell Done, MD, [facility]  [phone] beeper  [phone]

## 2014-03-03 NOTE — Interval H&P Note (Signed)
History and Physical Interval Note:  03/03/2014 8:30 AM  Lisa Yoder  has presented today for surgery, with the diagnosis of left neck cyst  The various methods of treatment have been discussed with the patient and family. After consideration of risks, benefits and other options for treatment, the patient has consented to  Procedure(s): LEFT NECK CYST EXCISION (Left) as a surgical intervention .  The patient's history has been reviewed, patient examined, no change in status, stable for surgery.  I have reviewed the patient's chart and labs.  Questions were answered to the patient's satisfaction.     [name]

## 2014-03-03 NOTE — Anesthesia Postprocedure Evaluation (Signed)
  Anesthesia Post-op Note  Patient: Lisa Yoder  Procedure(s) Performed: Procedure(s): LEFT NECK CYST EXCISION (Left)  Patient Location: PACU  Anesthesia Type: MAC  Level of Consciousness: awake and alert   Airway and Oxygen Therapy: Patient Spontanous Breathing  Post-op Pain: none  Post-op Assessment: Post-op Vital signs reviewed, Patient's Cardiovascular Status Stable and Respiratory Function Stable  Post-op Vital Signs: Reviewed  Filed Vitals:   03/03/14 0951  BP: 148/72  Pulse: 74  Temp: 36.6 C  Resp: 18    Complications: No apparent anesthesia complications

## 2014-03-04 ENCOUNTER — Encounter (HOSPITAL_BASED_OUTPATIENT_CLINIC_OR_DEPARTMENT_OTHER): Payer: Self-pay | Admitting: Surgery

## 2014-03-26 ENCOUNTER — Encounter (INDEPENDENT_AMBULATORY_CARE_PROVIDER_SITE_OTHER): Payer: BC Managed Care – PPO | Admitting: Surgery

## 2014-04-30 ENCOUNTER — Other Ambulatory Visit: Payer: Self-pay

## 2014-08-05 ENCOUNTER — Telehealth: Payer: Self-pay | Admitting: *Deleted

## 2014-08-05 NOTE — Telephone Encounter (Signed)
Phoned patient and rescheduled tomorrow's OV to March 25th.

## 2014-08-06 ENCOUNTER — Ambulatory Visit: Payer: BC Managed Care – PPO | Admitting: Family Medicine

## 2014-10-08 ENCOUNTER — Encounter: Payer: Self-pay | Admitting: Family Medicine

## 2014-10-08 ENCOUNTER — Ambulatory Visit (INDEPENDENT_AMBULATORY_CARE_PROVIDER_SITE_OTHER): Payer: BC Managed Care – PPO | Admitting: Family Medicine

## 2014-10-08 VITALS — BP 128/78 | HR 70 | Temp 97.5°F | Resp 16 | Ht 63.5 in | Wt 117.2 lb

## 2014-10-08 DIAGNOSIS — K589 Irritable bowel syndrome without diarrhea: Secondary | ICD-10-CM | POA: Diagnosis not present

## 2014-10-08 DIAGNOSIS — R1011 Right upper quadrant pain: Secondary | ICD-10-CM | POA: Diagnosis not present

## 2014-10-08 DIAGNOSIS — F39 Unspecified mood [affective] disorder: Secondary | ICD-10-CM

## 2014-10-08 DIAGNOSIS — J310 Chronic rhinitis: Secondary | ICD-10-CM

## 2014-10-08 DIAGNOSIS — H3530 Unspecified macular degeneration: Secondary | ICD-10-CM | POA: Diagnosis not present

## 2014-10-08 DIAGNOSIS — M810 Age-related osteoporosis without current pathological fracture: Secondary | ICD-10-CM | POA: Diagnosis not present

## 2014-10-08 DIAGNOSIS — G4700 Insomnia, unspecified: Secondary | ICD-10-CM

## 2014-10-08 DIAGNOSIS — R413 Other amnesia: Secondary | ICD-10-CM | POA: Diagnosis not present

## 2014-10-08 DIAGNOSIS — G8929 Other chronic pain: Secondary | ICD-10-CM | POA: Diagnosis not present

## 2014-10-08 DIAGNOSIS — R109 Unspecified abdominal pain: Secondary | ICD-10-CM

## 2014-10-08 DIAGNOSIS — K581 Irritable bowel syndrome with constipation: Secondary | ICD-10-CM

## 2014-10-08 DIAGNOSIS — Z79899 Other long term (current) drug therapy: Secondary | ICD-10-CM

## 2014-10-08 DIAGNOSIS — R10A1 Other chronic pain: Secondary | ICD-10-CM

## 2014-10-08 LAB — POCT CBC
GRANULOCYTE PERCENT: 60.1 % (ref 37–80)
HEMATOCRIT: 42.4 % (ref 37.7–47.9)
Hemoglobin: 13.1 g/dL (ref 12.2–16.2)
Lymph, poc: 1.9 (ref 0.6–3.4)
MCH, POC: 28.4 pg (ref 27–31.2)
MCHC: 30.9 g/dL — AB (ref 31.8–35.4)
MCV: 91.8 fL (ref 80–97)
MID (CBC): 0.4 (ref 0–0.9)
MPV: 8.8 fL (ref 0–99.8)
PLATELET COUNT, POC: 222 10*3/uL (ref 142–424)
POC GRANULOCYTE: 3.4 (ref 2–6.9)
POC LYMPH %: 33.4 % (ref 10–50)
POC MID %: 6.5 % (ref 0–12)
RBC: 4.61 M/uL (ref 4.04–5.48)
RDW, POC: 13.6 %
WBC: 5.6 10*3/uL (ref 4.6–10.2)

## 2014-10-08 LAB — POCT URINALYSIS DIPSTICK
Bilirubin, UA: NEGATIVE
Blood, UA: NEGATIVE
GLUCOSE UA: NEGATIVE
KETONES UA: NEGATIVE
Nitrite, UA: NEGATIVE
Protein, UA: NEGATIVE
SPEC GRAV UA: 1.015
Urobilinogen, UA: 0.2
pH, UA: 7.5

## 2014-10-08 LAB — COMPREHENSIVE METABOLIC PANEL
ALT: 12 U/L (ref 0–35)
AST: 14 U/L (ref 0–37)
Albumin: 4.5 g/dL (ref 3.5–5.2)
Alkaline Phosphatase: 80 U/L (ref 39–117)
BUN: 16 mg/dL (ref 6–23)
CALCIUM: 9.9 mg/dL (ref 8.4–10.5)
CO2: 25 mEq/L (ref 19–32)
CREATININE: 0.82 mg/dL (ref 0.50–1.10)
Chloride: 102 mEq/L (ref 96–112)
GLUCOSE: 80 mg/dL (ref 70–99)
Potassium: 4.2 mEq/L (ref 3.5–5.3)
Sodium: 140 mEq/L (ref 135–145)
Total Bilirubin: 0.5 mg/dL (ref 0.2–1.2)
Total Protein: 6.9 g/dL (ref 6.0–8.3)

## 2014-10-08 LAB — POCT UA - MICROSCOPIC ONLY
CASTS, UR, LPF, POC: NEGATIVE
Crystals, Ur, HPF, POC: NEGATIVE
Mucus, UA: NEGATIVE
Yeast, UA: NEGATIVE

## 2014-10-08 LAB — MAGNESIUM: Magnesium: 2.1 mg/dL (ref 1.5–2.5)

## 2014-10-08 LAB — POCT SEDIMENTATION RATE: POCT SED RATE: 10 mm/hr (ref 0–22)

## 2014-10-08 LAB — TSH: TSH: 2.471 u[IU]/mL (ref 0.350–4.500)

## 2014-10-08 LAB — CK: Total CK: 47 U/L (ref 7–177)

## 2014-10-08 MED ORDER — FLUTICASONE PROPIONATE 50 MCG/ACT NA SUSP: 2.0000 | Freq: Every day | NASAL | Status: DC

## 2014-10-08 NOTE — Patient Instructions (Addendum)
Consider trying fluticasone nasal spray every night to control your runny nose from the cold exposure if you are concerned about the possible memory loss side effects of allegra - I will try to look into this more. Memory problems are FAR more likely to come from the benzodiazepine class (temezapam, alprozolam, diazepam) all of which have been on your list as well as possible with the paxil. There are MANY other sleep meds that are better tolerated as we age than the temazepam and alprozalam so I would encourage you trying some of these later this year - if they don't work then you can always restart the alprazolam However, I do not think you should focus on making to many if any changes prior to your retirement since your life may change significantly. Continue with integrative therapies - they are wonderful. I agree that at some point you should try weaning off your paxil - can take 1/2 tab or every other day for several weeks then stop.  If your symptoms worsen/recur then you can always stop and then you would knoww.  It would be good if you could get off of the omeprazole if that is ok with Dr. Collene Mares - check out the [facility] website to see their excellent article about proton pump inhibitor addiction/withdrawl which is this type of medicine.

## 2014-10-08 NOTE — Progress Notes (Signed)
Subjective:    Patient ID: Lisa Yoder, female    DOB: 14-May-1951, 64 y.o.   MRN: [id number] This chart was scribed for Delman Cheadle, MD by Zola Button, Medical Scribe. This patient was seen in Room 28 and the patient's care was started at 11:37 AM.   Chief Complaint  Patient presents with  . establish care  . Gastrophageal Reflux  . Osteoporosis  . Constipation  . lack of sleep    HPI HPI Comments: Lisa Yoder is a 64 y.o. female with a hx of macular degeneration, osteoporosis, back pain, sleep disturbance, constipation, hiatal hernia, and hemorrhoids who presents to the [facility] to establish care.  No significant history available in chart. 9 months ago, she did have an episode of acute renal failure and has had several elevated glucose readings. She was hospitalized at that time for erythema multiforme as well as infected sebaceous skin. She did have a dermatologist to follow up with, and it was suspected it was an allergic reaction to Bactrim.  Patient has been with Dr. 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 She has been seeing her gynecologist, Dr. Helane Rima, for primary care since then. Her gynecologist recommended she get an internal medicine doctor. Patient wants to get off all of her medications eventually if possible.  Back Pain: Patient has been having back pain. She has been going to [facility] for [facility] therapy. She reports having spasms in the morning across her back. The back pain is non-radiating. Patient has used ibuprofen for the past few years for back pain, but she was been trying to stop it. Now she normally takes curcumin (natural ibuprofen alternative) with ibuprofen for more severe flare-ups of back pain.  Constipation: Patient has been on Linzess for constipation. She would like to get off of it eventually if possible.  Gastroesophageal Reflux: She states she can generally  control her reflux as long as she stays away from gluten and dairy. She does not eat red meat because her husband does not eat red meat.  Sleep Disturbance: She states her biggest problem is difficulty sleeping that started after menopause. She had been on alprazolam 1 mg for several years, but she was changed to temazepam 30 mg. Patient notes the temazepam does help her sleep, but she wakes up with the hangover effect. This has been prescribed by Dr. Helane Rima. She had initially been prescribed Paxil about 5-6 years ago for depression related to lack of sleep. She has still been taking it for sleep. Patient denies any bouts of depression before then.  Seasonal Allergies: Patient has had runny nose due to allergies. She has been taking half tablets of Allegra with relief to her allergies. Patient notes she has been having some memory recall issues and is concerned that it may be a side effect of Allegra.  Osteoporosis: Patient has osteoporosis with patient reported t-score of -3. Dr. Helane Rima had recommended that she have Reclast. Patient has been exercising.   Gynecologist: Dr. Helane Rima on [address]; she will see her again in May GI: Dr. Collene Mares  Patient will retire in July.  Past Medical History  Diagnosis Date  . GERD (gastroesophageal reflux disease)   . Osteoporosis   . Wears glasses   . Sciatica   . UTI (urinary tract infection) 8/15  . Anxiety   . Arthritis   . Cancer   . Cataract   . Chronic  kidney disease   . Depression    Past Surgical History  Procedure Laterality Date  . Cesarean section    . Tonsillectomy and adenoidectomy    . Upper gi endoscopy    . Colonoscopy    . Dilation and curettage of uterus    . Cyst removal neck Left 03/03/2014    Procedure: LEFT NECK CYST EXCISION;  Surgeon: Pedro Earls, MD;  Location: [facility];  Service: General;  Laterality: Left;  Marland Kitchen Eye surgery     Current Outpatient Prescriptions on File Prior to Visit  Medication Sig  Dispense Refill  . Calcium Carbonate-Vitamin D (CALCIUM PLUS VITAMIN D PO) Take by mouth.    . Cholecalciferol (VITAMIN D PO) Take by mouth.    . Coconut Oil OIL Take 5 mg by mouth daily.    . Digestive Enzymes (DIGESTIVE ENZYME PO) Take by mouth.    . diphenhydrAMINE-zinc acetate (BENADRYL) cream Apply topically daily as needed for itching. 28.4 g 0  . Linaclotide (LINZESS) 145 MCG CAPS capsule Take 145 mcg by mouth daily.    . Magnesium Hydroxide (MAGNESIA PO) Take by mouth.    . Multiple Vitamins-Minerals (PRESERVISION AREDS) CAPS Take 1 capsule by mouth daily.    . Omega-3 Fatty Acids (OMEGA 3 PO) Take by mouth.    Marland Kitchen omeprazole (PRILOSEC) 40 MG capsule Take 40 mg by mouth daily.    Marland Kitchen PARoxetine (PAXIL) 10 MG tablet Take 10 mg by mouth daily.     . polyvinyl alcohol (LIQUIFILM TEARS) 1.4 % ophthalmic solution Place 1-2 drops into both eyes as needed for dry eyes.    Marland Kitchen temazepam (RESTORIL) 30 MG capsule Take 30 mg by mouth at bedtime as needed for sleep.      No current facility-administered medications on file prior to visit.   Allergies  Allergen Reactions  . Bactrim [Sulfamethoxazole-Trimethoprim] Anaphylaxis, Hives and Rash   Family History  Problem Relation Age of Onset  . Cancer Mother     breast  . Hypertension Father   . Mental illness Sister   . Cancer Maternal Grandfather    History   Social History  . Marital Status: Married    Spouse Name: N/A  . Number of Children: N/A  . Years of Education: N/A   Social History Main Topics  . Smoking status: Former Smoker    Types: Cigarettes    Quit date: 12/23/1975  . Smokeless tobacco: Never Used  . Alcohol Use: Yes  . Drug Use: No  . Sexual Activity: No   Other Topics Concern  . None   Social History Narrative    Review of Systems  HENT: Positive for rhinorrhea.   Gastrointestinal: Positive for constipation.  Musculoskeletal: Positive for back pain.  Allergic/Immunologic: Positive for environmental  allergies.  Psychiatric/Behavioral: Positive for sleep disturbance.  All other systems reviewed and are negative.      Objective:  BP 128/78 mmHg  Pulse 70  Temp(Src) 97.5 F (36.4 C) (Oral)  Resp 16  Ht 5' 3.5" (1.613 m)  Wt 117 lb 3.2 oz (53.162 kg)  BMI 20.43 kg/m2  SpO2 97%  Physical Exam  Constitutional: She is oriented to person, place, and time. She appears well-developed and well-nourished. No distress.  HENT:  Head: Normocephalic and atraumatic.  Mouth/Throat: Oropharynx is clear and moist. No oropharyngeal exudate.  Eyes: Pupils are equal, round, and reactive to light.  Neck: Neck supple.  Mild, mobile, non-tender, non-fluctuant anterior cervical adenopathy.  Cardiovascular: Normal  rate, regular rhythm, S1 normal, S2 normal and normal heart sounds.   No murmur heard. Pulmonary/Chest: Effort normal and breath sounds normal. No respiratory distress. She has no wheezes. She has no rales.  Good air movement.  Abdominal: There is CVA tenderness.  Musculoskeletal: She exhibits no edema.  Lymphadenopathy:    She has cervical adenopathy.  Neurological: She is alert and oriented to person, place, and time. No cranial nerve deficit.  Skin: Skin is warm and dry. No rash noted.  Psychiatric: She has a normal mood and affect. Her behavior is normal.  Vitals reviewed.         Assessment & Plan:   Osteoporosis - Plan: Vit D  25 hydroxy (rtn osteoporosis monitoring), Comprehensive metabolic panel, TSH, CK, Magnesium, Ambulatory referral to Endocrinology - rec trying to stop ppi as may be inhibiting as much calcium absorption  Chronic right flank pain - Plan: POCT CBC, POCT UA - Microscopic Only, POCT urinalysis dipstick, POCT SEDIMENTATION RATE, Urine culture  Irritable bowel syndrome with constipation - good response to [facility]. - followed by Dr. Collene Mares  Insomnia - Plan: TSH, Magnesium - best controlled on prn alprazolam but has also responded to prn temezapam  and diazepam - rec trial of non-bzd/sedative-hypnotic meds  All of which pt has not tried any  Chronic nonallergic rhinitis - start flonase- unlikely that allergra is the causing of pt's c/o subtle memory loss but try stopping to see if improves.  Macular degeneration  Mood disorder - at future, rec trial off of paxil - decrease to 1/2 tab qd or qod when pt feels able to start taper after her retirement this yr.  Polypharmacy  Poss memory problem - doubt allegra but try stopping to see if sxs improve - more likely to be pt's bzd or paxil. Could be secondary to stress - pt going to retire soon so hopefully will improve.  Meds ordered this encounter  Medications  . fluticasone (FLONASE) 50 MCG/ACT nasal spray    Sig: Place 2 sprays into both nostrils at bedtime.    Dispense:  16 g    Refill:  2   Over 40 min spent in face-to-face evaluation of and consultation with patient and coordination of care.  Over 50% of this time was spent counseling this patient.  I personally performed the services described in this documentation, which was scribed in my presence. The recorded information has been reviewed and considered, and addended by me as needed.  Delman Cheadle, MD MPH   Results for orders placed or performed in visit on 10/08/14  Urine culture  Result Value Ref Range   Colony Count NO GROWTH    Organism ID, Bacteria NO GROWTH   Vit D  25 hydroxy (rtn osteoporosis monitoring)  Result Value Ref Range   Vit D, 25-Hydroxy 42 30 - 100 ng/mL  Comprehensive metabolic panel  Result Value Ref Range   Sodium 140 135 - 145 mEq/L   Potassium 4.2 3.5 - 5.3 mEq/L   Chloride 102 96 - 112 mEq/L   CO2 25 19 - 32 mEq/L   Glucose, Bld 80 70 - 99 mg/dL   BUN 16 6 - 23 mg/dL   Creat 0.82 0.50 - 1.10 mg/dL   Total Bilirubin 0.5 0.2 - 1.2 mg/dL   Alkaline Phosphatase 80 39 - 117 U/L   AST 14 0 - 37 U/L   ALT 12 0 - 35 U/L   Total Protein 6.9 6.0 - 8.3 g/dL  Albumin 4.5 3.5 - 5.2 g/dL   Calcium 9.9  8.4 - 10.5 mg/dL  TSH  Result Value Ref Range   TSH 2.471 0.350 - 4.500 uIU/mL  CK  Result Value Ref Range   Total CK 47 7 - 177 U/L  Magnesium  Result Value Ref Range   Magnesium 2.1 1.5 - 2.5 mg/dL  POCT CBC  Result Value Ref Range   WBC 5.6 4.6 - 10.2 K/uL   Lymph, poc 1.9 0.6 - 3.4   POC LYMPH PERCENT 33.4 10 - 50 %L   MID (cbc) 0.4 0 - 0.9   POC MID % 6.5 0 - 12 %M   POC Granulocyte 3.4 2 - 6.9   Granulocyte percent 60.1 37 - 80 %G   RBC 4.61 4.04 - 5.48 M/uL   Hemoglobin 13.1 12.2 - 16.2 g/dL   HCT, POC 42.4 37.7 - 47.9 %   MCV 91.8 80 - 97 fL   MCH, POC 28.4 27 - 31.2 pg   MCHC 30.9 (A) 31.8 - 35.4 g/dL   RDW, POC 13.6 %   Platelet Count, POC 222 142 - 424 K/uL   MPV 8.8 0 - 99.8 fL  POCT UA - Microscopic Only  Result Value Ref Range   WBC, Ur, HPF, POC 0*1    RBC, urine, microscopic 4-6    Bacteria, U Microscopic trace    Mucus, UA neg    Epithelial cells, urine per micros 0-1    Crystals, Ur, HPF, POC neg    Casts, Ur, LPF, POC neg    Yeast, UA neg   POCT urinalysis dipstick  Result Value Ref Range   Color, UA yellow    Clarity, UA clear    Glucose, UA neg    Bilirubin, UA neg    Ketones, UA neg    Spec Grav, UA 1.015    Blood, UA neg    pH, UA 7.5    Protein, UA neg    Urobilinogen, UA 0.2    Nitrite, UA neg    Leukocytes, UA Trace   POCT SEDIMENTATION RATE  Result Value Ref Range   POCT SED RATE 10 0 - 22 mm/hr

## 2014-10-09 LAB — VITAMIN D 25 HYDROXY (VIT D DEFICIENCY, FRACTURES): Vit D, 25-Hydroxy: 42 ng/mL (ref 30–100)

## 2014-10-10 LAB — URINE CULTURE
COLONY COUNT: NO GROWTH
ORGANISM ID, BACTERIA: NO GROWTH

## 2014-12-14 ENCOUNTER — Encounter: Payer: Self-pay | Admitting: Internal Medicine

## 2014-12-14 ENCOUNTER — Ambulatory Visit (INDEPENDENT_AMBULATORY_CARE_PROVIDER_SITE_OTHER): Payer: BC Managed Care – PPO | Admitting: Internal Medicine

## 2014-12-14 VITALS — BP 120/70 | HR 76 | Temp 97.8°F | Resp 12 | Ht 63.5 in | Wt 117.8 lb

## 2014-12-14 DIAGNOSIS — M810 Age-related osteoporosis without current pathological fracture: Secondary | ICD-10-CM | POA: Insufficient documentation

## 2014-12-14 NOTE — Progress Notes (Signed)
Patient ID: Lisa Yoder, female   DOB: 12/27/1950, 64 y.o.   MRN: [id number]   HPI  Lisa Yoder is a 64 y.o.-year-old female, referred by her PCP, Dr. Brigitte Pulse, for management of osteoporosis.  ObGyn: Dr. Dian Queen.  Pt was dx with OP in 2006. She denies fractures or falls. No dizziness/vertigo/orthostasis.  I reviewed pt's DEXA scans: Date L1-L4 T score FN T score  05/25/2013 -3.5 (-10.4%* c/w 03/14/2009) LFN: -3.4 RFN: -3.5  03/14/2009 -2.8   03/03/2007 -2.7   04/13/2005 -2.5    She has been on the following OP treatments:  - calcium + vit D - currently - Fosamax in 2006 >> for 1-2 years - Actonel >> for 2 more years.  No SEs.  Pt is on calcium and vitamin D. She also eats dairy and green, leafy, vegetables.   No h/o vitamin D deficiency. Last vit D level was: Component     Latest Ref Rng 10/08/2014  Vit D, 25-Hydroxy     30 - 100 ng/mL 42   No weight bearing exercises. + yoga, + water aerobics  She does not take high vitamin A doses.  No h/o hyper/hypocalcemia. No h/o hyperparathyroidism. No h/o kidney stones. Lab Results  Component Value Date   CALCIUM 9.9 10/08/2014   CALCIUM 10.2 02/15/2014   CALCIUM 8.2* 12/29/2013   CALCIUM 9.4 12/28/2013   No h/o thyrotoxicosis. Reviewed TSH recent levels:  Lab Results  Component Value Date   TSH 2.471 10/08/2014   TSH 1.710 12/30/2013   No h/o CKD. Last BUN/Cr: Lab Results  Component Value Date   BUN 16 10/08/2014   CREATININE 0.82 10/08/2014   Menopause was at 64 y/o.   Pt does have a FH of osteoporosis. Her sister is on Forteo.   I reviewed her chart and she also has a history of small hiatal hernia and dysphagia.  ROS: Constitutional: no weight gain/loss, + fatigue, no subjective hyperthermia/hypothermia Eyes: no blurry vision, no xerophthalmia ENT: no sore throat, no nodules palpated in throat, no dysphagia/odynophagia, no hoarseness Cardiovascular: no CP/SOB/palpitations/leg  swelling Respiratory: no cough/SOB Gastrointestinal: no N/V/D/+ C/ acid reflux+  Musculoskeletal: no muscle/joint aches Skin: no rashes Neurological: +head tremors/no numbness/tingling/dizziness Psychiatric: no depression/anxiety  Past Medical History  Diagnosis Date  . GERD (gastroesophageal reflux disease)   . Osteoporosis   . Wears glasses   . Sciatica   . UTI (urinary tract infection) 8/15  . Anxiety   . Arthritis   . Cancer   . Cataract   . Chronic kidney disease   . Depression    Past Surgical History  Procedure Laterality Date  . Cesarean section    . Tonsillectomy and adenoidectomy    . Upper gi endoscopy    . Colonoscopy    . Dilation and curettage of uterus    . Cyst removal neck Left 03/03/2014    Procedure: LEFT NECK CYST EXCISION;  Surgeon: Pedro Earls, MD;  Location: [facility];  Service: General;  Laterality: Left;  Marland Kitchen Eye surgery     History   Social History  . Marital Status: Married    Spouse Name: N/A  . Number of Children: 2   Occupational History  .  speech language pathologist   Social History Main Topics  . Smoking status: Former Smoker    Types: Cigarettes    Quit date:  42   . Smokeless tobacco: Never Used  . Alcohol Use: Yes  . Drug Use: No  Current Outpatient Prescriptions on File Prior to Visit  Medication Sig Dispense Refill  . Calcium Carbonate-Vitamin D (CALCIUM PLUS VITAMIN D PO) Take by mouth.    . Cholecalciferol (VITAMIN D PO) Take by mouth.    . Coconut Oil OIL Take 5 mg by mouth daily.    . Digestive Enzymes (DIGESTIVE ENZYME PO) Take by mouth.    . Linaclotide (LINZESS) 145 MCG CAPS capsule Take 145 mcg by mouth daily.    . Magnesium Hydroxide (MAGNESIA PO) Take by mouth.    . Multiple Vitamins-Minerals (PRESERVISION AREDS) CAPS Take 1 capsule by mouth daily.    . Omega-3 Fatty Acids (OMEGA 3 PO) Take by mouth.    . polyvinyl alcohol (LIQUIFILM TEARS) 1.4 % ophthalmic solution Place 1-2 drops into  both eyes as needed for dry eyes.    Marland Kitchen temazepam (RESTORIL) 30 MG capsule Take 30 mg by mouth at bedtime as needed for sleep.     . diphenhydrAMINE-zinc acetate (BENADRYL) cream Apply topically daily as needed for itching. (Patient not taking: Reported on 12/14/2014) 28.4 g 0  . fluticasone (FLONASE) 50 MCG/ACT nasal spray Place 2 sprays into both nostrils at bedtime. (Patient not taking: Reported on 12/14/2014) 16 g 2  . omeprazole (PRILOSEC) 40 MG capsule Take 40 mg by mouth daily.    Marland Kitchen PARoxetine (PAXIL) 10 MG tablet Take 10 mg by mouth daily.      No current facility-administered medications on file prior to visit.   Allergies  Allergen Reactions  . Bactrim [Sulfamethoxazole-Trimethoprim] Anaphylaxis, Hives and Rash  . Sulfamethoxazole-Trimethoprim Rash and Hives   Family History  Problem Relation Age of Onset  . Cancer Mother     breast  . Hypertension Father   . Mental illness Sister   . Cancer Maternal Grandfather    PE: BP 120/70 mmHg  Pulse 76  Temp(Src) 97.8 F (36.6 C) (Oral)  Resp 12  Ht 5' 3.5" (1.613 m)  Wt 117 lb 12.8 oz (53.434 kg)  BMI 20.54 kg/m2  SpO2 98% Wt Readings from Last 3 Encounters:  12/14/14 117 lb 12.8 oz (53.434 kg)  10/08/14 117 lb 3.2 oz (53.162 kg)  03/03/14 116 lb (52.617 kg)   Constitutional: Normal weight, in NAD. No kyphosis. Eyes: PERRLA, EOMI, no exophthalmos ENT: moist mucous membranes, no thyromegaly, no cervical lymphadenopathy Cardiovascular: RRR, No MRG Respiratory: CTA B Gastrointestinal: abdomen soft, NT, ND, BS+ Musculoskeletal: no deformities, strength intact in all 4; no tenderness at percussion of the spine Skin: moist, warm, no rashes Neurological: no tremor with outstretched hands, DTR normal in all 4  Assessment: 1. Osteoporosis  Plan: 1. Osteoporosis - likely postmenopausal, worse after stopping po Bisphosphonates, which she has tolerated well - Discussed about increased risk of fracture, depending on the T  score, greatly increased when the T score is lower than -2.5, but it is actually a continuum and -2.5 should not be regarded as an absolute threshold. We reviewed her latest DEXA scan together, and I explained that based on the T scores, she has an increased risk for fractures. We also compares her last T scores for the L spine with the ones from previous years >> decreased significantly - We discussed about the different medication classes, benefits and side effects (including atypical fractures and ONJ - no dental workup in progress or planned).  - I explained that, since she has hiatal hernia, she is not a good candidate for oral bisphosphonates anymore, so my first choice would be IV bisphosphonate,  zoledronic acid (iv Reclast) or sq denosumab (Prolia). I don't think I would use for Teriparatide at the moment, but she may need to use this in the future. No current contraindication for teriparatide. I explained the mechanism of action and expected benefits of the medication classes available.  - After a thorough discussion and answering her questions, patient decided to start Prolia. I explained that we would plan to continue this for 3 years, with a possible extension of another 3 years. I also advised her that after she finishes the course of Prolia, she should not stop osteoporosis medication completely, but we may need 1-2 years of Reclast at least. She agrees with the plan. - we reviewed her dietary and supplemental calcium and vitamin D intake, which I believe are adequate.I advised her to continue this - given her specific instructions about food sources for these - see pt instructions  - We discussed fall precautions   - I discussed then also I gave her a handout from [facility] Re: weight bearing exercises - advised to do this every day or at least 5/7 days - She is not smoking or > using 2 drinks of alcohol a day. - We reviewed the following tests:  Vitamin  D  BMP  TSH - will also try to check a new DEXA scan this year, before we start osteoporosis medication - I advised her to stay well hydrated when she comes for Prolia injection - will see pt back in a year  - time spent with the patient: 1 hour, of which >50% was spent in obtaining information about her symptoms, reviewing her previous labs, evaluations, and treatments, counseling her about her condition (please see the discussed topics above), and developing a plan to further investigate it; she had a number of questions which I addressed.  Orders Placed This Encounter  Procedures  . DG Bone Density

## 2014-12-14 NOTE — Patient Instructions (Addendum)
We will arrange for starting Prolia - you will have the injections in our office.  We will also try to get a new DEXA scan. Will let you know about the schedule.  How Can I Prevent Falls? Men and women with osteoporosis need to take care not to fall down. Falls can break bones. Some reasons people fall are: Poor vision  Poor balance  Certain diseases that affect how you walk  Some types of medicine, such as sleeping pills.  Some tips to help prevent falls outdoors are: Use a cane or walker  Wear rubber-soled shoes so you don't slip  Walk on grass when sidewalks are slippery  In winter, put salt or kitty litter on icy sidewalks.  Some ways to help prevent falls indoors are: Keep rooms free of clutter, especially on floors  Use plastic or carpet runners on slippery floors  Wear low-heeled shoes that provide good support  Do not walk in socks, stockings, or slippers  Be sure carpets and area rugs have skid-proof backs or are tacked to the floor  Be sure stairs are well lit and have rails on both sides  Put grab bars on bathroom walls near tub, shower, and toilet  Use a rubber bath mat in the shower or tub  Keep a flashlight next to your bed  Use a sturdy step stool with a handrail and wide steps  Add more lights in rooms (and night lights) Buy a cordless phone to keep with you so that you don't have to rush to the phone       when it rings and so that you can call for help if you fall.   (adapted from http://www.niams.NightlifePreviews.se)  Dietary sources of calcium and vitamin D:  Calcium content (mg) - http://www.niams.MoviePins.co.za  Fortified oatmeal, 1 packet 350  Sardines, canned in oil, with edible bones, 3 oz. 324  Cheddar cheese, 1 oz. shredded 306  Milk, nonfat, 1 cup 302  Milkshake, 1 cup 300  Yogurt, plain, low-fat, 1 cup 300  Soybeans, cooked, 1 cup 261  Tofu, firm, with calcium,  cup 204  Orange juice,  fortified with calcium, 6 oz. 200-260 (varies)  Salmon, canned, with edible bones, 3 oz. 181  Pudding, instant, made with 2% milk,  cup 153  Baked beans, 1 cup [place], 1% milk fat, 1 cup 138  Spaghetti, lasagna, 1 cup 125  Frozen yogurt, vanilla, soft-serve,  cup 103  Ready-to-eat cereal, fortified with calcium, 1 cup 100-1,000 (varies)  Cheese pizza, 1 slice 536  Fortified waffles, 2 100  Turnip greens, boiled,  cup 99  Broccoli, raw, 1 cup 90  Ice cream, vanilla,  cup 85  Soy or rice milk, fortified with calcium, 1 cup 80-500 (varies)   Vitamin D content (International Units, IU) - https://www.ars.usda.gov Cod liver oil, 1 tablespoon 1,360  Swordfish, cooked, 3 oz 566  Salmon (sockeye), cooked, 3 oz 447  Tuna fish, canned in water, drained, 3 oz 154  Orange juice fortified with vitamin D, 1 cup (check product labels, as amount of added vitamin D varies) 137  Milk, nonfat, reduced fat, and whole, vitamin D-fortified, 1 cup 115-124  Yogurt, fortified with 20% of the daily value for vitamin D, 6 oz 80  Margarine, fortified, 1 tablespoon 60  Sardines, canned in oil, drained, 2 sardines 46  Liver, beef, cooked, 3 oz 42  Egg, 1 large (vitamin D is found in yolk) 41  Ready-to-eat cereal, fortified with 10% of the  daily value for vitamin D, 0.75-1 cup  40  Cheese, Swiss, 1 oz 6   Exercise for Strong Bones (from [place]) There are two types of exercises that are important for building and maintaining bone density:  weight-bearing and muscle-strengthening exercises. Weight-bearing Exercises These exercises include activities that make you move against gravity while staying upright. Weight-bearing exercises can be high-impact or low-impact. High-impact weight-bearing exercises help build bones and keep them strong. If you have broken a bone due to osteoporosis or are at risk of breaking a bone, you may need to avoid high-impact exercises. If you're  not sure, you should check with your healthcare provider. Examples of high-impact weight-bearing exercises are: . Dancing . Doing high-impact aerobics . Hiking . Jogging/running . Jumping Rope . Stair climbing . Tennis Low-impact weight-bearing exercises can also help keep bones strong and are a safe alternative if you cannot do high-impact exercises. Examples of low-impact weight-bearing exercises are: . Using elliptical training machines . Doing low-impact aerobics . Using stair-step machines . Fast walking on a treadmill or outside Muscle-Strengthening Exercises These exercises include activities where you move your body, a weight or some other resistance against gravity. They are also known as resistance exercises and include: . Lifting weights . Using elastic exercise bands . Using weight machines . Lifting your own body weight . Functional movements, such as standing and rising up on your toes Yoga and Pilates can also improve strength, balance and flexibility. However, certain positions may not be safe for people with osteoporosis or those at increased risk of broken bones. For example, exercises that have you bend forward may increase the chance of breaking a bone in the spine. A physical therapist should be able to help you learn which exercises are safe and appropriate for you. Non-Impact Exercises Non-impact exercises can help you to improve balance, posture and how well you move in everyday activities. These exercises can also help to increase muscle strength and decrease the risk of falls and broken bones. Some of these exercises include: . Balance exercises that strengthen your legs and test your balance, such as Tai Chi, can decrease your risk of falls. . Posture exercises that improve your posture and reduce rounded or "sloping" shoulders can help you decrease the chance of breaking a bone, especially in the spine. . Functional exercises that improve how well you move can  help you with everyday activities and decrease your chance of falling and breaking a bone. For example, if you have trouble getting up from a chair or climbing stairs, you should do these activities as exercises. A physical therapist can teach you balance, posture and functional exercises. Starting a New Exercise Program If you haven't exercised regularly for a while, check with your healthcare provider before beginning a new exercise program-particularly if you have health problems such as heart disease, diabetes or high blood pressure. If you're at high risk of breaking a bone, you should work with a physical therapist to develop a safe exercise program. Once you have your healthcare provider's approval, start slowly. If you've already broken bones in the spine because of osteoporosis, be very careful to avoid activities that require reaching down, bending forward, rapid twisting motions, heavy lifting and those that increase your chance of a fall. As you get started, your muscles may feel sore for a day or two after you exercise. If soreness lasts longer, you may be working too hard and need to ease up. Exercises should be done in a  pain-free range of motion. How Much Exercise Do You Need? Weight-bearing exercises 30 minutes on most days of the week. Do a 30-minutesession or multiple sessions spread out throughout the day. The benefits to your bones are the same.   Muscle-strengthening exercises Two to three days per week. If you don't have much time for strengthening/resistance training, do small amounts at a time. You can do just one body part each day. For example do arms one day, legs the next and trunk the next. You can also spread these exercises out during your normal day.  Balance, posture and functional exercises Every day or as often as needed. You may want to focus on one area more than the others. If you have fallen or lose your balance, spend time doing balance exercises. If you are getting  rounded shoulders, work more on posture exercises. If you have trouble climbing stairs or getting up from the couch, do more functional exercises. You can also perform these exercises at one time or spread them during your day. Work with a phyiscal therapist to learn the right exercises for you.

## 2014-12-16 ENCOUNTER — Telehealth: Payer: Self-pay | Admitting: *Deleted

## 2014-12-16 NOTE — Telephone Encounter (Signed)
Rose, will you initiate a PA for Prolia for this pt? Thank you for all you do for our patients!

## 2014-12-20 NOTE — Telephone Encounter (Signed)
I have electronically submitted pt's info for [place] verification.  And thank you, you do so much!

## 2014-12-22 ENCOUNTER — Telehealth: Payer: Self-pay | Admitting: *Deleted

## 2014-12-22 NOTE — Telephone Encounter (Signed)
I would have loved to have one before starting Prolia, but it is up to her - I am not sure of the cost. It is not mandatory, though.

## 2014-12-22 NOTE — Telephone Encounter (Signed)
Called pt schedule pt's DEXA and they said she is not due until November. Do you want me to go ahead and schedule one anyway?

## 2014-12-23 NOTE — Telephone Encounter (Signed)
I can contact them and check on the cost of the DEXA first.

## 2015-01-04 NOTE — Telephone Encounter (Signed)
I have rec'd pt's insurance verification for Prolia, however, her [employer] is requiring a prior authorization.  I have completed most of the necessary form and faxed to your attention so you can request Dr. Cruzita Lederer to complete/sign the rest of the form. Once complete you can return to me via fax, [phone]. Thank you.

## 2015-01-10 ENCOUNTER — Other Ambulatory Visit: Payer: Self-pay

## 2015-01-18 NOTE — Telephone Encounter (Signed)
I know Dr. Cruzita Lederer has been out of the office, but I just wanted to make sure you did receive the [employer] p/a form for her to complete/sign. Thank you.

## 2015-01-19 NOTE — Telephone Encounter (Signed)
Form complete, signed and faxed back to [employer].

## 2015-01-23 ENCOUNTER — Other Ambulatory Visit: Payer: Self-pay | Admitting: Family Medicine

## 2015-01-23 ENCOUNTER — Ambulatory Visit (INDEPENDENT_AMBULATORY_CARE_PROVIDER_SITE_OTHER): Payer: BC Managed Care – PPO | Admitting: Family Medicine

## 2015-01-23 VITALS — BP 134/78 | HR 85 | Temp 98.0°F | Resp 18 | Ht 64.0 in | Wt 111.0 lb

## 2015-01-23 DIAGNOSIS — F4321 Adjustment disorder with depressed mood: Secondary | ICD-10-CM | POA: Diagnosis not present

## 2015-01-23 MED ORDER — SERTRALINE HCL 50 MG PO TABS: 25.0000 mg | ORAL_TABLET | Freq: Every day | ORAL | Status: DC

## 2015-01-23 NOTE — Patient Instructions (Addendum)
Please look up below people and groups - I would recommend making an appointment w/ a therapist/counseler there and you can also schedule with the psychiatrist - a lot of times I will  prescribe medicines when pt's are seeing one of the therapists or can help monitor medications after you have established on a regimen with a psychiatrist - I am happy to contribute in whatever way is most useful and helpful to you. Please call me and leave a message on Thursday about who you have decided to see and when your appointment is.  Please call me in about 2 weeks to let me know how you are doing on the zoloft so we can decide if we want to continue or adjust or do something different.  I would recommend a follow-up office visit in about a month - you can call the week before to figure out when I am going to be in the office that works for you and walk in at a time that is convenient or you call [phone] to schedule an office visit with me on a Friday.  Please write on a calender - what you are going to do for exercise that day (walk vs yoga) and one other small task you are going to accomplish so you can check or mark these off when you are done so that you can hold yourself accountable and have your husband support you in this too.  Dr. Yehuda Budd at the [facility] - they have a new location in [place] with Nunzio Cobbs [address] [place], [place] [address]  [phone]  Address: [address] [place],  [address]  [phone]  Dr. Letta Moynahan   [facility] - They are amazing group of psychologists are very talented but they do not have an MD in their group to prescribe medicine - usually work with [place] closely as the primary MD to select and monitor medications  Adjustment Disorder Most changes in life can cause stress. Getting used to changes may take a few months or  longer. If feelings of stress, hopelessness, or worry continue, you may have an adjustment disorder. This stress-related mental health problem may affect your feelings, thinking and how you act. It occurs in both sexes and happens at any age. SYMPTOMS  Some of the following problems may be seen and vary from person to person:  Sadness or depression.  Loss of enjoyment.  Thoughts of suicide.  Fighting.  Avoiding family and friends.  Poor school performance.  Hopelessness, sense of loss.  Trouble sleeping.  Vandalism.  Worry, weight loss or gain.  Crying spells.  Anxiety  Reckless driving.  Skipping school.  Poor work Systems analyst.  Nervousness.  Ignoring bills.  Poor attitude. DIAGNOSIS  Your caregiver will ask what has happened in your life and do a physical exam. They will make a diagnosis of an adjustment disorder when they are sure another problem or medical illness causing your feelings does not exist. TREATMENT  When problems caused by stress interfere with you daily life or last longer than a few months, you may need counseling for an adjustment disorder. Early treatment may diminish problems and help you to better cope with the stressful events in your life. Sometimes medication is necessary. Individual counseling and or support groups can be very helpful. PROGNOSIS  Adjustment disorders usually last less than 3 to 6 months. The condition may persist  if there is long lasting stress. This could include health problems, relationship problems, or job difficulties where you can not easily escape from what is causing the problem. PREVENTION  Even the most mentally healthy, highly functioning people can suffer from an adjustment disorder given a significant blow from a life-changing event. There is no way to prevent pain and loss. Most people need help from time to time. You are not alone. SEEK MEDICAL CARE IF:  Your feelings or symptoms listed above do not improve or  worsen. Document Released: 03/06/2006 Document Revised: 09/24/2011 Document Reviewed: 05/28/2007 [facility] Patient Information 2015 [place], [place]. This information is not intended to replace advice given to you by your health care provider. Make sure you discuss any questions you have with your health care provider. Major Depressive Disorder Major depressive disorder is a mental illness. It also may be called clinical depression or unipolar depression. Major depressive disorder usually causes feelings of sadness, hopelessness, or helplessness. Some people with this disorder do not feel particularly sad but lose interest in doing things they used to enjoy (anhedonia). Major depressive disorder also can cause physical symptoms. It can interfere with work, school, relationships, and other normal everyday activities. The disorder varies in severity but is longer lasting and more serious than the sadness we all feel from time to time in our lives. Major depressive disorder often is triggered by stressful life events or major life changes. Examples of these triggers include divorce, loss of your job or home, a move, and the death of a family member or close friend. Sometimes this disorder occurs for no obvious reason at all. People who have family members with major depressive disorder or bipolar disorder are at higher risk for developing this disorder, with or without life stressors. Major depressive disorder can occur at any age. It may occur just once in your life (single episode major depressive disorder). It may occur multiple times (recurrent major depressive disorder). SYMPTOMS People with major depressive disorder have either anhedonia or depressed mood on nearly a daily basis for at least 2 weeks or longer. Symptoms of depressed mood include:  Feelings of sadness (blue or down in the dumps) or emptiness.  Feelings of hopelessness or helplessness.  Tearfulness or episodes of crying (may be observed  by others).  Irritability (children and adolescents). In addition to depressed mood or anhedonia or both, people with this disorder have at least four of the following symptoms:  Difficulty sleeping or sleeping too much.   Significant change (increase or decrease) in appetite or weight.   Lack of energy or motivation.  Feelings of guilt and worthlessness.   Difficulty concentrating, remembering, or making decisions.  Unusually slow movement (psychomotor retardation) or restlessness (as observed by others).   Recurrent wishes for death, recurrent thoughts of self-harm (suicide), or a suicide attempt. People with major depressive disorder commonly have persistent negative thoughts about themselves, other people, and the world. People with severe major depressive disorder may experiencedistorted beliefs or perceptions about the world (psychotic delusions). They also may see or hear things that are not real (psychotic hallucinations). DIAGNOSIS Major depressive disorder is diagnosed through an assessment by your health care provider. Your health care provider will ask aboutaspects of your daily life, such as mood,sleep, and appetite, to see if you have the diagnostic symptoms of major depressive disorder. Your health care provider may ask about your medical history and use of alcohol or drugs, including prescription medicines. Your health care provider also may do a  physical exam and blood work. This is because certain medical conditions and the use of certain substances can cause major depressive disorder-like symptoms (secondary depression). Your health care provider also may refer you to a mental health specialist for further evaluation and treatment. TREATMENT It is important to recognize the symptoms of major depressive disorder and seek treatment. The following treatments can be prescribed for this disorder:   Medicine. Antidepressant medicines usually are prescribed. Antidepressant  medicines are thought to correct chemical imbalances in the brain that are commonly associated with major depressive disorder. Other types of medicine may be added if the symptoms do not respond to antidepressant medicines alone or if psychotic delusions or hallucinations occur.  Talk therapy. Talk therapy can be helpful in treating major depressive disorder by providing support, education, and guidance. Certain types of talk therapy also can help with negative thinking (cognitive behavioral therapy) and with relationship issues that trigger this disorder (interpersonal therapy). A mental health specialist can help determine which treatment is best for you. Most people with major depressive disorder do well with a combination of medicine and talk therapy. Treatments involving electrical stimulation of the brain can be used in situations with extremely severe symptoms or when medicine and talk therapy do not work over time. These treatments include electroconvulsive therapy, transcranial magnetic stimulation, and vagal nerve stimulation. Document Released: 10/27/2012 Document Revised: 11/16/2013 Document Reviewed: 10/27/2012 [facility] Patient Information 2015 [place], [place]. This information is not intended to replace advice given to you by your health care provider. Make sure you discuss any questions you have with your health care provider. Suicidal Feelings, How to Help Yourself Everyone feels sad or unhappy at times, but depressing thoughts and feelings of hopelessness can lead to thoughts of suicide. It can seem as if life is too tough to handle. If you feel as though you have reached the point where suicide is the only answer, it is time to let someone know immediately.  HOW TO COPE AND PREVENT SUICIDE  Let family, friends, teachers, or counselors know. Get help. Try not to isolate yourself from those who care about you. Even though you may not feel sociable, talk with someone every day. It is best if  it is face-to-face. Remember, they will want to help you.  Eat a regularly spaced and well-balanced diet.  Get plenty of rest.  Avoid alcohol and drugs because they will only make you feel worse and may also lower your inhibitions. Remove them from the home. If you are thinking of taking an overdose of your prescribed medicines, give your medicines to someone who can give them to you one day at a time. If you are on antidepressants, let your caregiver know of your feelings so he or she can provide a safer medicine, if that is a concern.  Remove weapons or poisons from your home.  Try to stick to routines. Follow a schedule and remind yourself that you have to keep that schedule every day.  Set some realistic goals and achieve them. Make a list and cross things off as you go. Accomplishments give a sense of worth. Wait until you are feeling better before doing things you find difficult or unpleasant to do.  If you are able, try to start exercising. Even half-hour periods of exercise each day will make you feel better. Getting out in the sun or into nature helps you recover from depression faster. If you have a favorite place to walk, take advantage of that.  Increase safe  activities that have always given you pleasure. This may include playing your favorite music, reading a good book, painting a picture, or playing your favorite instrument. Do whatever takes your mind off your depression.  Keep your living space well-lighted. GET HELP Contact a suicide hotline, crisis center, or local suicide prevention center for help right away. Local centers may include a hospital, clinic, community service organization, social service provider, or health department.  Call your local emergency services (911 in the [place]).  Call a suicide hotline:  1-800-273-TALK (1-217-029-0647) in the [place].  1-800-SUICIDE (434)643-5090) in the [place].  (512)224-4123 in the [place] for  Spanish-speaking counselors.  0-938-182-9HBZ 4378131637) in the [place] for TTY users.  Visit the following websites for information and help:  National Suicide Prevention Lifeline: www.suicidepreventionlifeline.org  Hopeline: www.hopeline.[place] for Suicide Prevention: PromotionalLoans.co.za  For lesbian, gay, bisexual, transgender, or questioning youth, contact [employer]:  1-751-0-C-HENIDP (734)631-0654) in the [place].  www.thetrevorproject.org  In [place], treatment resources are listed in each [place] with listings available under USAA for [employer] or similar titles. Another source for Crisis Centres by [place] is located at http://www.suicideprevention.ca/in-crisis-now/find-a-crisis-centre-now/crisis-centres Document Released: 01/06/2003 Document Revised: 09/24/2011 Document Reviewed: 10/27/2013 [facility] Patient Information 2015 [place], [place]. This information is not intended to replace advice given to you by your health care provider. Make sure you discuss any questions you have with your health care provider.

## 2015-01-23 NOTE — Progress Notes (Addendum)
Subjective:  This chart was scribed for Delman Cheadle, MD by Moises Blood, Medical Scribe. This patient was seen in Room 3 and the patient's care was started 12:50 PM.    Patient ID: Lisa Yoder, female    DOB: 02-17-1951, 64 y.o.   MRN: [id number] Chief Complaint  Patient presents with  . Depression    see screen     HPI Lisa Yoder is a 63 y.o. female who presents to [facility] complaining of depression. She retired 6-8 weeks ago after working for 30 years. She mentions that she stopped taking her paxil when she retired 6-8 weeks ago too. She was instructed to take it a night but she couldn't sleep well. She started to take it in the morning. She stopped taking it because she felt sedated by it. She says she can't make decisions well, lost interest in activities she used to enjoy, feels disorganized, and is also scared to drive now. She also notes that she fell yesterday. She says she can do an activity but after a short time, she just wants to lay in bed. She used to exercise and enjoyed it, but now, she doesn't find interest in it either. She has been taking temazepam. She has never been on any other medication for mood. She denies panic attacks and anxiety. She has some appetite loss and has to force herself to eat. She still goes to water aerobics and yoga, but doesn't get the same enjoyment like she used to.   She states that she sleeps around 10:30 PM and wakes up around 7:00 AM. She would get up and wants to go back to bed to just lay there. She doesn't sleep during the day.   She has seen multiple therapists throughout her life. She has an appointment on Wednesday with Dr. Helane Rima. She used to see Dr. Florina Ou once a year for PCP in the past.  She had 1 tearful episode since her retirement.   She also notes bowel movements being looser and about twice a day. She is still on the linzess. She was initially on it for constipation. Her husband says that her appetite has correlation to  her mood.   She had a hiatal hernia.    Past Medical History  Diagnosis Date  . GERD (gastroesophageal reflux disease)   . Osteoporosis   . Wears glasses   . Sciatica   . UTI (urinary tract infection) 8/15  . Anxiety   . Arthritis   . Cancer   . Cataract   . Chronic kidney disease   . Depression    Current Outpatient Prescriptions on File Prior to Visit  Medication Sig Dispense Refill  . ALOE VERA JUICE LIQD Take by mouth. 2 T. 2 x daily    . Calcium Carbonate-Vitamin D (CALCIUM PLUS VITAMIN D PO) Take by mouth.    . Cholecalciferol (VITAMIN D PO) Take by mouth.    . Coconut Oil OIL Take 5 mg by mouth daily.    . Cyanocobalamin (VITAMIN B-12 CR PO) Take by mouth.    . Digestive Enzymes (DIGESTIVE ENZYME PO) Take by mouth.    . LECITHIN PO Take by mouth.    . Linaclotide (LINZESS) 145 MCG CAPS capsule Take 145 mcg by mouth daily.    . Magnesium Hydroxide (MAGNESIA PO) Take by mouth.    . Multiple Vitamins-Minerals (PRESERVISION AREDS) CAPS Take 1 capsule by mouth daily.    . Omega-3 Fatty Acids (OMEGA 3 PO) Take  by mouth.    Marland Kitchen omeprazole (PRILOSEC) 40 MG capsule Take 40 mg by mouth daily.    . polyvinyl alcohol (LIQUIFILM TEARS) 1.4 % ophthalmic solution Place 1-2 drops into both eyes as needed for dry eyes.    . Probiotic Product (ADVANCED PROBIOTIC 10 PO) Take 2 tablets by mouth daily. 20 billion    . Specialty Vitamins Products (ONE-A-DAY BONE STRENGTH PO) Take by mouth.    . temazepam (RESTORIL) 30 MG capsule Take 30 mg by mouth at bedtime as needed for sleep.     Marland Kitchen PARoxetine (PAXIL) 10 MG tablet Take 10 mg by mouth daily.      No current facility-administered medications on file prior to visit.   Allergies  Allergen Reactions  . Bactrim [Sulfamethoxazole-Trimethoprim] Anaphylaxis, Hives and Rash  . Sulfamethoxazole-Trimethoprim Rash and Hives      Review of Systems  Constitutional: Positive for appetite change and fatigue. Negative for fever.  Respiratory:  Negative for shortness of breath.   Gastrointestinal: Negative for nausea, vomiting, diarrhea and constipation.  Skin: Negative for wound.  Psychiatric/Behavioral: Positive for sleep disturbance and dysphoric mood. Negative for suicidal ideas and self-injury. The patient is not nervous/anxious.        Objective:   Physical Exam  Constitutional: She is oriented to person, place, and time. She appears well-developed and well-nourished. No distress.  HENT:  Head: Normocephalic and atraumatic.  Eyes: EOM are normal. Pupils are equal, round, and reactive to light.  Neck: Neck supple.  Cardiovascular: Normal rate.   Pulmonary/Chest: Effort normal. No respiratory distress.  Musculoskeletal: Normal range of motion.  Neurological: She is alert and oriented to person, place, and time.  Skin: Skin is warm and dry.  Psychiatric: Her speech is normal. She is slowed. Cognition and memory are impaired. She does not express impulsivity. She exhibits a depressed mood. She expresses no homicidal and no suicidal ideation. She expresses no suicidal plans and no homicidal plans. She exhibits abnormal recent memory. She exhibits normal remote memory.  Tearful.  Nursing note and vitals reviewed.   BP 134/78 mmHg  Pulse 85  Temp(Src) 98 F (36.7 C) (Oral)  Resp 18  Ht 5\' 4"  (1.626 m)  Wt 111 lb (50.349 kg)  BMI 19.04 kg/m2  SpO2 98%       Assessment & Plan:   1. Adjustment disorder with depressed mood   Pt was on paxil 10mg  x 6 yrs - to slow her down - and she stopped in 6 wks ago when she retired - has intermittently started and stopped it again since. Still taking temazepam to sleep - couldn't fall asleep whenever she skips this.  Start zoloft - encouraged to pick a therapist (and psychiatrist if pt prefers) but Thurs and call to let me know.  Call to check in after 1-2 wks on zoloft to ensure improving, stop paxil.  Rec forcing self to maintain reg schedule, no napping, exercise daily, rec get 1  thing accomplished daily - record and use list or husband for accountability. Encouraged journaling.  Pt contracts for safety.  Meds ordered this encounter  Medications  . Potassium 75 MG TABS    Sig: Take 99 mg by mouth.  . sertraline (ZOLOFT) 50 MG tablet    Sig: Take 0.5 tablets (25 mg total) by mouth daily. X 1 week, then increase to 1 tab po qd if no side effects    Dispense:  30 tablet    Refill:  1   Over 50  min spent in face-to-face evaluation of and consultation with patient and her husband and coordination of care.  Over 80% of this time was spent counseling this patient.  I personally performed the services described in this documentation, which was scribed in my presence. The recorded information has been reviewed and considered, and addended by me as needed.  Delman Cheadle, MD MPH

## 2015-01-26 ENCOUNTER — Other Ambulatory Visit: Payer: Self-pay | Admitting: Obstetrics and Gynecology

## 2015-01-27 ENCOUNTER — Telehealth: Payer: Self-pay

## 2015-01-27 NOTE — Telephone Encounter (Signed)
PATIENT STATES SHE SAW DR. SHAW A FEW DAYS AGO FOR DEPRESSION. DR. Brigitte Pulse SUGGESTED THAT SHE GO TO SEE A COUNSELOR (DR. Izora Gala BALL) WHICH SHE DID ON Tuesday. SHE WANTS DR. SHAW TO KNOW THAT THE ZOLOFT IS HELPING, BUT IT MAKES HER A LITTLE SHAKY. ALSO, DR. Helane Rima WANTS TO CONTINUE TO MONITOR HER MEDICATIONS HERSELF. BEST PHONE [phone] (CELL) PHARMACY CHOICE IS [facility] ON [address] [place]

## 2015-01-28 LAB — CYTOLOGY - PAP

## 2015-01-29 ENCOUNTER — Encounter: Payer: Self-pay | Admitting: Family Medicine

## 2015-01-31 MED ORDER — ESCITALOPRAM OXALATE 5 MG PO TABS: 2.5000 mg | ORAL_TABLET | Freq: Every day | ORAL | Status: DC

## 2015-01-31 NOTE — Telephone Encounter (Signed)
Faxed completed P/A along w/clinicals from [facility] 12/14/2014 to [facility]

## 2015-01-31 NOTE — Telephone Encounter (Signed)
Paroxetine 10 wasn't working - causing to much sedation and zoloft 25 causes to much shakiness so will alternatively put pt on lexapro 5 - 1/2 tab a day. lexapro tends to have less side effects than the others. If pt wants her gynecologist to follow her anti-depressants than that is fine but then she should probably call her gynecologist for medication choices/changes/concerns and let her decide if she should restart paroxetine or try something else.  We don't want to many cooks in the kitchen so she should try to stick with just 1 doctor for mood meds especially as [facility] is not on epic so it is difficult to coordinate care.  My understanding was that her husband wanted her to see a psychiatrist - MD - but whatever works best for Lisa Yoder is fine.  I'm very proud of her with following through with the psychologist. Called pt and LVM about med changes.

## 2015-02-15 NOTE — Telephone Encounter (Signed)
[employer] had stated pt's plan has been terminated.  Does she have another plan?  Also, please find out if she has Medicare as primary or secondary.  I need insurance for all plans she has.  Thank you.

## 2015-03-07 NOTE — Telephone Encounter (Signed)
Have we checked w/pt about her insurance yet?  [employer]BS is terminated.  Thank you.

## 2015-03-18 ENCOUNTER — Encounter: Payer: Self-pay | Admitting: *Deleted

## 2015-03-18 NOTE — Telephone Encounter (Signed)
Unable to reach pt. Sent a letter to her. Be advised.

## 2015-04-08 ENCOUNTER — Telehealth: Payer: Self-pay | Admitting: *Deleted

## 2015-04-08 NOTE — Telephone Encounter (Signed)
Spoke with pt about her insurance and she wants to have her bone density test done before she does the Prolia inj. Pt to come by the office and bring her new ins card for her supplemental ins next week. Be advised.

## 2015-04-18 ENCOUNTER — Telehealth: Payer: Self-pay | Admitting: Internal Medicine

## 2015-04-18 NOTE — Telephone Encounter (Signed)
04/26/2015 9:30 appt is at [place]

## 2015-04-18 NOTE — Telephone Encounter (Signed)
Have we ever heard anything else from Ms. Sutcliffe? Thank you.

## 2015-04-18 NOTE — Telephone Encounter (Signed)
Pt would like [place] to schedule her bone density when we can

## 2015-04-19 NOTE — Telephone Encounter (Signed)
I apologize that I did not tell you that I finally was able to speak with her and she said that she would discuss more with Dr Cruzita Lederer at her appt. She has not decided if she wants to do the Prolia yet.

## 2015-04-26 ENCOUNTER — Other Ambulatory Visit: Payer: Medicare Other

## 2015-05-27 ENCOUNTER — Ambulatory Visit (INDEPENDENT_AMBULATORY_CARE_PROVIDER_SITE_OTHER)
Admission: RE | Admit: 2015-05-27 | Discharge: 2015-05-27 | Disposition: A | Discharge status: Home / Self Care | Payer: Medicare Other | Source: Ambulatory Visit | Attending: Internal Medicine | Admitting: Internal Medicine

## 2015-05-27 DIAGNOSIS — M810 Age-related osteoporosis without current pathological fracture: Secondary | ICD-10-CM | POA: Diagnosis not present

## 2015-06-20 ENCOUNTER — Telehealth: Payer: Self-pay | Admitting: *Deleted

## 2015-06-20 NOTE — Telephone Encounter (Signed)
[place], [name]'s insurance is not in the system correctly.  I was going to try to correct it, but there's so much info in there I thought someone should probably contact the patient to be sure the correct info is entered.  I think she has [employer] as primary.  I sent a staff mssg to [place] to correct it and asked her to let me know once it's correct.  I will run the verification once I know it's correct.  Thank you.

## 2015-06-20 NOTE — Telephone Encounter (Signed)
Please initiate a PA renewal for Prolia for pt. Thank you!

## 2015-09-02 ENCOUNTER — Ambulatory Visit (INDEPENDENT_AMBULATORY_CARE_PROVIDER_SITE_OTHER): Payer: Medicare Other | Admitting: Internal Medicine

## 2015-09-02 ENCOUNTER — Encounter: Payer: Self-pay | Admitting: Internal Medicine

## 2015-09-02 VITALS — BP 102/64 | HR 71 | Temp 97.8°F | Resp 12 | Wt 118.0 lb

## 2015-09-02 DIAGNOSIS — M810 Age-related osteoporosis without current pathological fracture: Secondary | ICD-10-CM

## 2015-09-02 LAB — VITAMIN D 25 HYDROXY (VIT D DEFICIENCY, FRACTURES): VITD: 43.57 ng/mL (ref 30.00–100.00)

## 2015-09-02 NOTE — Progress Notes (Signed)
Patient ID: Lisa Yoder, female   DOB: 1951/01/23, 65 y.o.   MRN: [id number]   HPI  Lisa Yoder is a 65 y.o.-year-old female, returning for f/u for osteoporosis. Last visit 9 mo ago. ObGyn: Dr. Dian Queen.  She had 2 falls >> did not have any fractures.   She is seeing a naturopath >> takes  Bone Strength supplement (has been on it for 6 years). She was reassured that this is bone-building, stimulating the osteoblasts. Pt will send me the name of the supplement.  Therefore, for now, patient would like to defer Prolia and wait 2 more years to recheck her bone density before deciding for it.  Reviewed and addended hx:  Pt was dx with OP in 2006. No dizziness/vertigo/orthostasis.  I reviewed pt's DEXA scans: Date L1-L4 T score FN T score  05/27/2015 () -3.0 LFN: -2.7 RFN: -3.1  05/25/2013 ([facility]) -3.5 (-10.4%* c/w 03/14/2009) LFN: -3.4 RFN: -3.5  03/14/2009 -2.8   03/03/2007 -2.7   04/13/2005 -2.5    She has been on the following OP treatments:  - calcium + vit D - currently. Takes 5000 units of vitamin D. - Fosamax in 2006 >> for 1-2 years - Actonel >> for 2 more years.  I suggested Prolia at last visit. No SEs.  Pt is on calcium and vitamin D. She also eats dairy and green, leafy, vegetables.   No h/o vitamin D deficiency. Last vit D level was: Component     Latest Ref Rng 10/08/2014  Vit D, 25-Hydroxy     30 - 100 ng/mL 42  She had another level checked by her naturopath >> will send me the result.  No weight bearing exercises. + yoga, + water aerobics  She does not take high vitamin A doses.  No h/o hyper/hypocalcemia. No h/o hyperparathyroidism. No h/o kidney stones. Lab Results  Component Value Date   CALCIUM 9.9 10/08/2014   CALCIUM 10.2 02/15/2014   CALCIUM 8.2* 12/29/2013   CALCIUM 9.4 12/28/2013   No h/o thyrotoxicosis. Reviewed TSH recent levels:  Lab Results  Component Value Date   TSH 2.471 10/08/2014   TSH  1.710 12/30/2013   No h/o CKD. Last BUN/Cr: Lab Results  Component Value Date   BUN 16 10/08/2014   CREATININE 0.82 10/08/2014   Menopause was at 65 y/o.   Pt does have a FH of osteoporosis. Her sister is on Forteo.   I reviewed her chart and she also has a history of small hiatal hernia and dysphagia.  ROS: Constitutional: no weight gain/loss, + fatigue, no subjective hyperthermia/hypothermia Eyes: no blurry vision, no xerophthalmia ENT: no sore throat, no nodules palpated in throat, no dysphagia/odynophagia, no hoarseness Cardiovascular: no CP/SOB/palpitations/leg swelling Respiratory: no cough/SOB Gastrointestinal: no N/V/D/C/ acid reflux Musculoskeletal: no muscle/joint aches Skin: no rashes Neurological: + head tremors/no numbness/tingling/dizziness  I reviewed pt's medications, allergies, PMH, social hx, family hx, and changes were documented in the history of present illness. Otherwise, unchanged from my initial visit note.  Past Medical History  Diagnosis Date  . GERD (gastroesophageal reflux disease)   . Osteoporosis   . Wears glasses   . Sciatica   . UTI (urinary tract infection) 8/15  . Anxiety   . Arthritis   . Cancer   . Cataract   . Chronic kidney disease   . Depression    Past Surgical History  Procedure Laterality Date  . Cesarean section    . Tonsillectomy and adenoidectomy    .  Upper gi endoscopy    . Colonoscopy    . Dilation and curettage of uterus    . Cyst removal neck Left 03/03/2014    Procedure: LEFT NECK CYST EXCISION;  Surgeon: Pedro Earls, MD;  Location: [facility];  Service: General;  Laterality: Left;  Marland Kitchen Eye surgery     History   Social History  . Marital Status: Married    Spouse Name: N/A  . Number of Children: 2   Occupational History  .  speech language pathologist   Social History Main Topics  . Smoking status: Former Smoker    Types: Cigarettes    Quit date:  15   . Smokeless tobacco: Never Used   . Alcohol Use: Yes  . Drug Use: No   Current Outpatient Prescriptions on File Prior to Visit  Medication Sig Dispense Refill  . ALOE VERA JUICE LIQD Take by mouth. 2 T. 2 x daily    . Calcium Carbonate-Vitamin D (CALCIUM PLUS VITAMIN D PO) Take by mouth.    . Cholecalciferol (VITAMIN D PO) Take by mouth.    . Coconut Oil OIL Take 5 mg by mouth daily.    . Cyanocobalamin (VITAMIN B-12 CR PO) Take by mouth.    . Digestive Enzymes (DIGESTIVE ENZYME PO) Take by mouth.    . escitalopram (LEXAPRO) 5 MG tablet Take 0.5 tablets (2.5 mg total) by mouth at bedtime. 30 tablet 1  . LECITHIN PO Take by mouth.    . Linaclotide (LINZESS) 145 MCG CAPS capsule Take 145 mcg by mouth daily.    . Magnesium Hydroxide (MAGNESIA PO) Take by mouth.    . Multiple Vitamins-Minerals (PRESERVISION AREDS) CAPS Take 1 capsule by mouth daily.    . Omega-3 Fatty Acids (OMEGA 3 PO) Take by mouth.    Marland Kitchen omeprazole (PRILOSEC) 40 MG capsule Take 40 mg by mouth daily.    . polyvinyl alcohol (LIQUIFILM TEARS) 1.4 % ophthalmic solution Place 1-2 drops into both eyes as needed for dry eyes.    . Potassium 75 MG TABS Take 99 mg by mouth.    . Probiotic Product (ADVANCED PROBIOTIC 10 PO) Take 2 tablets by mouth daily. 20 billion    . Specialty Vitamins Products (ONE-A-DAY BONE STRENGTH PO) Take by mouth.    . temazepam (RESTORIL) 30 MG capsule Take 30 mg by mouth at bedtime as needed for sleep.      No current facility-administered medications on file prior to visit.   Allergies  Allergen Reactions  . Bactrim [Sulfamethoxazole-Trimethoprim] Anaphylaxis, Hives and Rash  . Sulfamethoxazole-Trimethoprim Rash and Hives   Family History  Problem Relation Age of Onset  . Cancer Mother     breast  . Hypertension Father   . Mental illness Sister   . Cancer Maternal Grandfather    PE: BP 102/64 mmHg  Pulse 71  Temp(Src) 97.8 F (36.6 C) (Oral)  Resp 12  Wt 118 lb (53.524 kg)  SpO2 98% Body mass index is 20.24  kg/(m^2). Wt Readings from Last 3 Encounters:  09/02/15 118 lb (53.524 kg)  01/23/15 111 lb (50.349 kg)  12/14/14 117 lb 12.8 oz (53.434 kg)   Constitutional: Normal weight, in NAD. No kyphosis. Eyes: PERRLA, EOMI, no exophthalmos ENT: moist mucous membranes, no thyromegaly, no cervical lymphadenopathy Cardiovascular: RRR, No MRG Respiratory: CTA B Gastrointestinal: abdomen soft, NT, ND, BS+ Musculoskeletal: no deformities, strength intact in all 4; no tenderness at percussion of the spine Skin: moist, warm, no rashes  Neurological: no tremor with outstretched hands, DTR normal in all 4  Assessment: 1. Osteoporosis  Plan: 1. Osteoporosis - likely postmenopausal, worse after stopping po Bisphosphonates, which she has tolerated well - We again discussed about increased risk of fracture, depending on the T score, greatly increased when the T score is lower than -2.5, but it is actually a continuum and -2.5 should not be regarded as an absolute threshold. We reviewed her latest DEXA scan together, and I explained that based on the T scores, she has an increased risk for fractures, although, the scores are better compared to the ones obtained in 2014. I did advise the patient that the scores are not directly comparable since they were obtained on different machines, but they are more consistent with the ones obtaining 2010, which is reassuring. -  At this point, patient would prefer to continue without Prolia , only on her bone supplement.  She understands that she is at higher  Fracture risk due to her low scores , even if they improve at next check in 2 years. I advised her to send me the exact name of her bone supplements I can look it up , but will plan to check another DEXA scan at the end of 2018 and we will then discuss the results. She agrees to start Prolia if the  T-scores are not stable or improved - we reviewed her dietary and supplemental calcium and vitamin D intake, which I believe  are adequate, but will check vit D today - I advised her for balance exercises - will see pt back after the DEXA scan so we can discuss the results  Orders Placed This Encounter  Procedures  . Vitamin D, 25-hydroxy   Office Visit on 09/02/2015  Component Date Value Ref Range Status  . VITD 09/02/2015 43.57  30.00 - 100.00 ng/mL Final   Normal vitamin D.

## 2015-09-02 NOTE — Patient Instructions (Addendum)
Please schedule an appt with me in 06/2017. Call me beginning of November 2018 to order the new DEXA scan.  Please stop at the lab.  Please send me a message about the bone supplement.

## 2015-12-14 ENCOUNTER — Ambulatory Visit: Payer: Medicare Other | Admitting: Internal Medicine

## 2016-08-28 ENCOUNTER — Ambulatory Visit (INDEPENDENT_AMBULATORY_CARE_PROVIDER_SITE_OTHER): Payer: Medicare Other | Admitting: Internal Medicine

## 2016-08-28 ENCOUNTER — Encounter: Payer: Self-pay | Admitting: Internal Medicine

## 2016-08-28 VITALS — BP 122/82 | HR 82 | Wt 124.0 lb

## 2016-08-28 DIAGNOSIS — M810 Age-related osteoporosis without current pathological fracture: Secondary | ICD-10-CM | POA: Diagnosis not present

## 2016-08-28 LAB — BASIC METABOLIC PANEL WITH GFR
BUN: 19 mg/dL (ref 7–25)
CO2: 27 mmol/L (ref 20–31)
Calcium: 10.1 mg/dL (ref 8.6–10.4)
Chloride: 105 mmol/L (ref 98–110)
Creat: 0.94 mg/dL (ref 0.50–0.99)
GFR, EST AFRICAN AMERICAN: 73 mL/min (ref >=60)
GFR, Est Non African American: 63 mL/min (ref >=60)
GLUCOSE: 107 mg/dL — AB (ref 65–99)
POTASSIUM: 4.1 mmol/L (ref 3.5–5.3)
SODIUM: 141 mmol/L (ref 135–146)

## 2016-08-28 LAB — VITAMIN D 25 HYDROXY (VIT D DEFICIENCY, FRACTURES): VITD: 31.59 ng/mL (ref 30.00–100.00)

## 2016-08-28 NOTE — Patient Instructions (Signed)
Please stop at the lab.  Please call me in 05/2017 to order the new DEXA scan.  Please return in 1 year.  Exercise for Strong Bones (from [employer]) There are two types of exercises that are important for building and maintaining bone density:  weight-bearing and muscle-strengthening exercises. Weight-bearing Exercises These exercises include activities that make you move against gravity while staying upright. Weight-bearing exercises can be high-impact or low-impact. High-impact weight-bearing exercises help build bones and keep them strong. If you have broken a bone due to osteoporosis or are at risk of breaking a bone, you may need to avoid high-impact exercises. If you're not sure, you should check with your healthcare provider. Examples of high-impact weight-bearing exercises are: . Dancing . Doing high-impact aerobics . Hiking . Jogging/running . Jumping Rope . Stair climbing . Tennis Low-impact weight-bearing exercises can also help keep bones strong and are a safe alternative if you cannot do high-impact exercises. Examples of low-impact weight-bearing exercises are: . Using elliptical training machines . Doing low-impact aerobics . Using stair-step machines . Fast walking on a treadmill or outside Muscle-Strengthening Exercises These exercises include activities where you move your body, a weight or some other resistance against gravity. They are also known as resistance exercises and include: . Lifting weights . Using elastic exercise bands . Using weight machines . Lifting your own body weight . Functional movements, such as standing and rising up on your toes Yoga and Pilates can also improve strength, balance and flexibility. However, certain positions may not be safe for people with osteoporosis or those at increased risk of broken bones. For example, exercises that have you bend forward may increase the chance of breaking a bone in the spine. A  physical therapist should be able to help you learn which exercises are safe and appropriate for you. Non-Impact Exercises Non-impact exercises can help you to improve balance, posture and how well you move in everyday activities. These exercises can also help to increase muscle strength and decrease the risk of falls and broken bones. Some of these exercises include: . Balance exercises that strengthen your legs and test your balance, such as Tai Chi, can decrease your risk of falls. . Posture exercises that improve your posture and reduce rounded or "sloping" shoulders can help you decrease the chance of breaking a bone, especially in the spine. . Functional exercises that improve how well you move can help you with everyday activities and decrease your chance of falling and breaking a bone. For example, if you have trouble getting up from a chair or climbing stairs, you should do these activities as exercises. A physical therapist can teach you balance, posture and functional exercises. Starting a New Exercise Program If you haven't exercised regularly for a while, check with your healthcare provider before beginning a new exercise program-particularly if you have health problems such as heart disease, diabetes or high blood pressure. If you're at high risk of breaking a bone, you should work with a physical therapist to develop a safe exercise program. Once you have your healthcare provider's approval, start slowly. If you've already broken bones in the spine because of osteoporosis, be very careful to avoid activities that require reaching down, bending forward, rapid twisting motions, heavy lifting and those that increase your chance of a fall. As you get started, your muscles may feel sore for a day or two after you exercise. If soreness lasts longer, you may be working too hard and need to ease up.  Exercises should be done in a pain-free range of motion. How Much Exercise Do You  Need? Weight-bearing exercises 30 minutes on most days of the week. Do a 30-minutesession or multiple sessions spread out throughout the day. The benefits to your bones are the same.   Muscle-strengthening exercises Two to three days per week. If you don't have much time for strengthening/resistance training, do small amounts at a time. You can do just one body part each day. For example do arms one day, legs the next and trunk the next. You can also spread these exercises out during your normal day.  Balance, posture and functional exercises Every day or as often as needed. You may want to focus on one area more than the others. If you have fallen or lose your balance, spend time doing balance exercises. If you are getting rounded shoulders, work more on posture exercises. If you have trouble climbing stairs or getting up from the couch, do more functional exercises. You can also perform these exercises at one time or spread them during your day. Work with a phyiscal therapist to learn the right exercises for you.

## 2016-08-28 NOTE — Progress Notes (Signed)
Patient ID: Lisa Yoder, female   DOB: [birth year], 66 y.o.   MRN: [id number]   HPI  Lisa Yoder is a 66 y.o.-year-old female, returning for f/u for osteoporosis. Last visit 1 year ago. ObGyn: Dr. Dian Queen.  She had 2 falls >> did not have any fractures.   She is seeing a naturopath >> takes  Bone Strength supplement >> vitamin D3 1000, vitamin K1, K2, calcium 770 mg, Strontium 5 mg - 2 tab with b'fast and 1x dinner  At last visit, she told me she would like to defer Prolia and wait 2 more years to recheck her bone density before deciding for it.  Reviewed and addended hx:  Pt was dx with OP in 2006. No dizziness/vertigo/orthostasis.  I reviewed pt's DEXA scans: Date L1-L4 T score FN T score  05/27/2015 ([place]) -3.0 LFN: -2.7 RFN: -3.1  05/25/2013 ([facility]) -3.5 (-10.4%* c/w 03/14/2009) LFN: -3.4 RFN: -3.5  03/14/2009 -2.8   03/03/2007 -2.7   04/13/2005 -2.5    She has been on the following OP treatments:  - calcium + vit D - currently. Takes 4000 + 1000 units of vitamin D. - Fosamax in 2006 >> for 1-2 years - Actonel >> for 2 more years.  Refused Prolia. No SEs.  Pt is on calcium and vitamin D. She also eats dairy and green, leafy, vegetables.   No h/o vitamin D deficiency. Last vit D level was: Lab Results  Component Value Date   VD25OH 43.57 09/02/2015   VD25OH 42 10/08/2014   No weight bearing exercises. + yoga, + water aerobics - less than before.  She does not take high vitamin A doses.  No h/o hyper/hypocalcemia. No h/o hyperparathyroidism. No h/o kidney stones. Lab Results  Component Value Date   CALCIUM 9.9 10/08/2014   CALCIUM 10.2 02/15/2014   CALCIUM 8.2 (L) 12/29/2013   CALCIUM 9.4 12/28/2013   No h/o thyrotoxicosis. Reviewed TSH recent levels:  Lab Results  Component Value Date   TSH 2.471 10/08/2014   TSH 1.710 12/30/2013   No h/o CKD. Last BUN/Cr: Lab Results  Component Value Date   BUN 16 10/08/2014   CREATININE 0.82 10/08/2014   Menopause was at 66 y/o.   Pt does have a FH of osteoporosis. Her sister is on Forteo.   I reviewed her chart and she also has a history of small hiatal hernia and dysphagia.  ROS: Constitutional: no weight gain/loss, no fatigue, no subjective hyperthermia/hypothermia, + poor sleep Eyes: no blurry vision, no xerophthalmia ENT: no sore throat, no nodules palpated in throat, no dysphagia/odynophagia, no hoarseness Cardiovascular: no CP/SOB/palpitations/leg swelling Respiratory: no cough/SOB Gastrointestinal: no N/V/D/C/ acid reflux Musculoskeletal: +muscle aches/no joint aches Skin: no rashes Neurological: + head tremors/no numbness/tingling/dizziness  I reviewed pt's medications, allergies, PMH, social hx, family hx, and changes were documented in the history of present illness. Otherwise, unchanged from my initial visit note.  Past Medical History:  Diagnosis Date  . Anxiety   . Arthritis   . Cancer ([place])   . Cataract   . Chronic kidney disease   . Depression   . GERD (gastroesophageal reflux disease)   . Osteoporosis   . Sciatica   . UTI (urinary tract infection) 8/15  . Wears glasses    Past Surgical History:  Procedure Laterality Date  . CESAREAN SECTION    . COLONOSCOPY    . CYST REMOVAL NECK Left 03/03/2014   Procedure: LEFT NECK CYST EXCISION;  Surgeon: Isabel Caprice  Hassell Done, MD;  Location: [place];  Service: General;  Laterality: Left;  . DILATION AND CURETTAGE OF UTERUS    . EYE SURGERY    . TONSILLECTOMY AND ADENOIDECTOMY    . UPPER GI ENDOSCOPY     History   Social History  . Marital Status: Married    Spouse Name: N/A  . Number of Children: 2   Occupational History  .  speech language pathologist   Social History Main Topics  . Smoking status: Former Smoker    Types: Cigarettes    Quit date:  13   . Smokeless tobacco: Never Used  . Alcohol Use: Yes  . Drug Use: No   Current Outpatient Prescriptions  on File Prior to Visit  Medication Sig Dispense Refill  . ALOE VERA JUICE LIQD Take by mouth. 2 T. 2 x daily    . Calcium Carbonate-Vitamin D (CALCIUM PLUS VITAMIN D PO) Take by mouth.    . Cholecalciferol (VITAMIN D PO) Take by mouth.    . Cyanocobalamin (VITAMIN B-12 CR PO) Take by mouth.    . Digestive Enzymes (DIGESTIVE ENZYME PO) Take by mouth.    . LECITHIN PO Take by mouth.    . Magnesium Hydroxide (MAGNESIA PO) Take by mouth.    . Omega-3 Fatty Acids (OMEGA 3 PO) Take by mouth.    . polyvinyl alcohol (LIQUIFILM TEARS) 1.4 % ophthalmic solution Place 1-2 drops into both eyes as needed for dry eyes.    . Probiotic Product (ADVANCED PROBIOTIC 10 PO) Take 2 tablets by mouth daily. 20 billion    . Specialty Vitamins Products (ONE-A-DAY BONE STRENGTH PO) Take by mouth.    . Coconut Oil OIL Take 5 mg by mouth daily.    Marland Kitchen escitalopram (LEXAPRO) 5 MG tablet Take 0.5 tablets (2.5 mg total) by mouth at bedtime. (Patient not taking: Reported on 08/28/2016) 30 tablet 1  . Linaclotide (LINZESS) 145 MCG CAPS capsule Take 145 mcg by mouth daily.    . Multiple Vitamins-Minerals (PRESERVISION AREDS) CAPS Take 1 capsule by mouth daily.    Marland Kitchen omeprazole (PRILOSEC) 40 MG capsule Take 40 mg by mouth daily.    . Potassium 75 MG TABS Take 99 mg by mouth.    . temazepam (RESTORIL) 30 MG capsule Take 30 mg by mouth at bedtime as needed for sleep.      No current facility-administered medications on file prior to visit.    Allergies  Allergen Reactions  . Bactrim [Sulfamethoxazole-Trimethoprim] Anaphylaxis, Hives and Rash  . Sulfamethoxazole-Trimethoprim Rash and Hives   Family History  Problem Relation Age of Onset  . Cancer Mother     breast  . Hypertension Father   . Mental illness Sister   . Cancer Maternal Grandfather    PE: BP 122/82 (BP Location: Left Arm, Patient Position: Sitting)   Pulse 82   Wt 124 lb (56.2 kg)   SpO2 98%   BMI 21.28 kg/m  Body mass index is 21.28 kg/m. Wt Readings  from Last 3 Encounters:  08/28/16 124 lb (56.2 kg)  09/02/15 118 lb (53.5 kg)  01/23/15 111 lb (50.3 kg)   Constitutional: Normal weight, in NAD. No kyphosis. Eyes: PERRLA, EOMI, no exophthalmos ENT: moist mucous membranes, no thyromegaly, no cervical lymphadenopathy Cardiovascular: RRR, No MRG Respiratory: CTA B Gastrointestinal: abdomen soft, NT, ND, BS+ Musculoskeletal: no deformities, strength intact in all 4 Skin: moist, warm, no rashes Neurological: no tremor with outstretched hands, DTR normal in all 4  Assessment: 1. Osteoporosis  Plan: 1. Osteoporosis - likely postmenopausal, worse after stopping po Bisphosphonates, which she has tolerated well - We again discussed about increased risk of fracture, depending on the T score, greatly increased when the T score is lower than -2.5.  We reviewed her latest 2 DEXA scan together, and I explained that based on the T scores, she has an increased risk for fractures, although, the scores are better compared to the ones obtained in 2014.The scores are not directly comparable since they were obtained on different machines, but they are more consistent with the ones obtaining 2010.  - I recommended Prolia in the past >> she refused and she again tells me she would like to continue w/o OP medication, only on her bone supplement.  - She would like to start an OP exercise Pgm >> discussed about these and also given a list of recommended exercises - will plan to check another DEXA scan in 11/ 2018  - we reviewed her dietary and supplemental calcium and vitamin D intake, which I believe are adequate, but will check vit D today along with a BMP - will see pt back in 1 year  Office Visit on 08/28/2016  Component Date Value Ref Range Status  . VITD 08/28/2016 31.59  30.00 - 100.00 ng/mL Final   Msg sent: Dear Lisa Yoder, Your vitamin D level has decreased to the lower limit of normal. I would suggest to increase the vitamin D dose from 5000  units a day to 7000 units a day (add another 2000 units). Sincerely, Philemon Kingdom MD    Chemistry      Component Value Date/Time   NA 141 08/28/2016 1539   K 4.1 08/28/2016 1539   CL 105 08/28/2016 1539   CO2 27 08/28/2016 1539   BUN 19 08/28/2016 1539   CREATININE 0.94 08/28/2016 1539      Component Value Date/Time   CALCIUM 10.1 08/28/2016 1539   ALKPHOS 80 10/08/2014 1236   AST 14 10/08/2014 1236   ALT 12 10/08/2014 1236   BILITOT 0.5 10/08/2014 1236     BMP normal.  Philemon Kingdom, MD PhD [facility] Endocrinology

## 2016-09-07 ENCOUNTER — Telehealth: Payer: Self-pay | Admitting: Internal Medicine

## 2016-09-07 ENCOUNTER — Other Ambulatory Visit: Payer: Self-pay

## 2016-09-07 ENCOUNTER — Telehealth: Payer: Self-pay

## 2016-09-07 NOTE — Telephone Encounter (Signed)
Called patient to discuss medication list. Deleted medications that she is not taking anymore.

## 2016-09-07 NOTE — Telephone Encounter (Signed)
Patient ask you to give her a call her med list is wrong, please call to correct it .

## 2017-03-20 ENCOUNTER — Other Ambulatory Visit: Payer: Self-pay | Admitting: Obstetrics and Gynecology

## 2017-03-20 DIAGNOSIS — R928 Other abnormal and inconclusive findings on diagnostic imaging of breast: Secondary | ICD-10-CM

## 2017-03-27 ENCOUNTER — Ambulatory Visit
Admission: RE | Admit: 2017-03-27 | Discharge: 2017-03-27 | Disposition: A | Discharge status: Home / Self Care | Payer: Medicare Other | Source: Ambulatory Visit | Attending: Obstetrics and Gynecology | Admitting: Obstetrics and Gynecology

## 2017-03-27 ENCOUNTER — Ambulatory Visit: Payer: Medicare Other

## 2017-03-27 DIAGNOSIS — R928 Other abnormal and inconclusive findings on diagnostic imaging of breast: Secondary | ICD-10-CM

## 2017-04-17 DIAGNOSIS — F329 Major depressive disorder, single episode, unspecified: Secondary | ICD-10-CM | POA: Insufficient documentation

## 2017-04-17 DIAGNOSIS — M545 Low back pain, unspecified: Secondary | ICD-10-CM | POA: Insufficient documentation

## 2017-04-17 DIAGNOSIS — K648 Other hemorrhoids: Secondary | ICD-10-CM | POA: Insufficient documentation

## 2017-04-17 DIAGNOSIS — K219 Gastro-esophageal reflux disease without esophagitis: Secondary | ICD-10-CM | POA: Insufficient documentation

## 2017-05-20 ENCOUNTER — Telehealth: Payer: Self-pay | Admitting: *Deleted

## 2017-05-20 ENCOUNTER — Telehealth: Payer: Self-pay

## 2017-05-20 ENCOUNTER — Other Ambulatory Visit: Payer: Self-pay | Admitting: Internal Medicine

## 2017-05-20 DIAGNOSIS — M810 Age-related osteoporosis without current pathological fracture: Secondary | ICD-10-CM

## 2017-05-20 NOTE — Telephone Encounter (Signed)
Called, LVM to let patient know we had scheduled her for her bone density, She is scheduled for November 12th at 1:30. Advised patient to call back if questions.

## 2017-05-20 NOTE — Telephone Encounter (Signed)
Lisa Yoder, I placed the order >> can you please schedule at [facility] - need to be after 05/26/17

## 2017-05-20 NOTE — Telephone Encounter (Signed)
Please advise. Thank you

## 2017-05-20 NOTE — Telephone Encounter (Signed)
Patient called and states the last time she seen Dr. Cruzita Lederer she was told to call and ask for a order to be placed for a bone density test. Please advise. Thank you

## 2017-05-27 ENCOUNTER — Telehealth: Payer: Self-pay

## 2017-05-27 ENCOUNTER — Other Ambulatory Visit: Payer: Medicare Other

## 2017-05-27 NOTE — Telephone Encounter (Signed)
Yes, no pb, we need to reschedule. Ty!

## 2017-05-27 NOTE — Telephone Encounter (Signed)
Per review of chart:  Future Appointments    Provider [place]  05/27/2017 1:30 PM [facility] DEXA [address] [facility] [address] [facility]

## 2017-05-27 NOTE — Telephone Encounter (Signed)
Patient called back and repeated what she stated in the note below- she said that the [place] office say she does not have an appointment at their facility- I am going to call back and reschedule at [facility] office- thank you for the information

## 2017-05-27 NOTE — Telephone Encounter (Signed)
Patient called today and she needs to reschedule her bone density scan because she accidentally took her calcium- we have looked into this and can not see what facility this patient is scheduled at patient has called [place] office and they stated she is not scheduled with them can you please advise on what facility this patient is scheduled to get Dexa scan

## 2017-05-28 ENCOUNTER — Ambulatory Visit (INDEPENDENT_AMBULATORY_CARE_PROVIDER_SITE_OTHER)
Admission: RE | Admit: 2017-05-28 | Discharge: 2017-05-28 | Disposition: A | Discharge status: Home / Self Care | Payer: Medicare Other | Source: Ambulatory Visit | Attending: Internal Medicine | Admitting: Internal Medicine

## 2017-05-28 DIAGNOSIS — M810 Age-related osteoporosis without current pathological fracture: Secondary | ICD-10-CM

## 2017-05-28 NOTE — Telephone Encounter (Signed)
I scheduled the patient for today at 1:30 and I notified the patient of this

## 2017-08-02 ENCOUNTER — Other Ambulatory Visit: Payer: Self-pay | Admitting: Ophthalmology

## 2017-08-04 ENCOUNTER — Other Ambulatory Visit: Payer: Self-pay | Admitting: Ophthalmology

## 2017-08-04 DIAGNOSIS — H5000 Unspecified esotropia: Secondary | ICD-10-CM

## 2017-08-04 DIAGNOSIS — H532 Diplopia: Secondary | ICD-10-CM

## 2017-08-04 DIAGNOSIS — H5021 Vertical strabismus, right eye: Secondary | ICD-10-CM

## 2017-08-13 ENCOUNTER — Ambulatory Visit
Admission: RE | Admit: 2017-08-13 | Discharge: 2017-08-13 | Disposition: A | Discharge status: Home / Self Care | Payer: Medicare Other | Source: Ambulatory Visit | Attending: Ophthalmology | Admitting: Ophthalmology

## 2017-08-13 DIAGNOSIS — H5000 Unspecified esotropia: Secondary | ICD-10-CM

## 2017-08-13 DIAGNOSIS — H532 Diplopia: Secondary | ICD-10-CM

## 2017-08-13 DIAGNOSIS — H5021 Vertical strabismus, right eye: Secondary | ICD-10-CM

## 2017-08-13 MED ORDER — GADOBENATE DIMEGLUMINE 529 MG/ML IV SOLN
12.0000 mL | Freq: Once | INTRAVENOUS | Status: AC | PRN
  Administered 2017-08-13: 12 mL via INTRAVENOUS

## 2017-08-16 ENCOUNTER — Other Ambulatory Visit: Payer: Self-pay | Admitting: Orthopedic Surgery

## 2017-08-16 DIAGNOSIS — M545 Low back pain, unspecified: Secondary | ICD-10-CM

## 2017-08-23 ENCOUNTER — Ambulatory Visit
Admission: RE | Admit: 2017-08-23 | Discharge: 2017-08-23 | Disposition: A | Discharge status: Home / Self Care | Payer: Medicare Other | Source: Ambulatory Visit | Attending: Orthopedic Surgery | Admitting: Orthopedic Surgery

## 2017-08-23 DIAGNOSIS — M545 Low back pain, unspecified: Secondary | ICD-10-CM

## 2017-08-29 ENCOUNTER — Ambulatory Visit: Payer: Medicare Other | Admitting: Internal Medicine

## 2017-10-21 ENCOUNTER — Ambulatory Visit: Payer: Medicare Other | Admitting: Internal Medicine

## 2017-10-21 NOTE — Progress Notes (Deleted)
Patient ID: KILLIAN Yoder, female   DOB: January 20, 1951, 67 y.o.   MRN: [id number]   HPI  Lisa Yoder is a 67 y.o.-year-old female, returning for f/u for osteoporosis. Last visit 1 year ago. ObGyn: Dr. Dian Queen.  No fracture since last visit.  She continues to see a naturopath ropath - takes  Bone Strength supplement >> vitamin D3 1000, vitamin K1, K2, calcium 770 mg, Strontium 5 mg - 2 tab with b'fast and 1x dinner  Reviewed and addended history:  Pt was dx with OP in 2006.   No history of falls.  No dizziness/vertigo/orthostasis.  I reviewed pt's DEXA scans: Date L1-L4 T score FN T score  05/28/2017 ([place]) (L3): -3.3 (-0.9%) LFN: -2.8 (-2.2%* RFN: -3.1  05/27/2015 ([place]) -3.0 LFN: -2.7 RFN: -3.1  05/25/2013 ([facility]) -3.5 (-10.4%* c/w 03/14/2009) LFN: -3.4 RFN: -3.5  03/14/2009 -2.8   03/03/2007 -2.7   04/13/2005 -2.5    She has been on the following OP treatments:  - Fosamax in 2006 >> for 1-2 years - Actonel >> for 2 more years.  She refused Prolia.  She does not have any skeletal pain.  No history of vitamin D deficiency.  Reviewed latest vitamin D levels: Lab Results  Component Value Date   VD25OH 31.59 08/28/2016   VD25OH 43.57 09/02/2015   VD25OH 42 10/08/2014  At last visit, she was on 5000 units vitamin D daily, but as vitamin D level was close to the lower limit of normal, I advised her to increase this to 7000 units daily.  No weight bearing exercises.  She does yoga and water aerobics.  She is not on high vitamin A doses.  No hyper/hypocalcemia.  No hyperparathyroidism.  No kidney stones. Lab Results  Component Value Date   CALCIUM 10.1 08/28/2016   CALCIUM 9.9 10/08/2014   CALCIUM 10.2 02/15/2014   CALCIUM 8.2 (L) 12/29/2013   CALCIUM 9.4 12/28/2013   No thyrotoxicosis.  Reviewed TSH recent levels:  Lab Results  Component Value Date   TSH 2.471 10/08/2014   TSH 1.710 12/30/2013   No h/o CKD.  Reviewed latest  BUN/Cr: Lab Results  Component Value Date   BUN 19 08/28/2016   CREATININE 0.94 08/28/2016   Menopause was at 67 years old.  She does have a family history of osteoporosis in her sister, who is on Forteo.  She also has a history of small hiatal hernia and dysphagia.  ROS: Constitutional: no weight gain/no weight loss, no fatigue, no subjective hyperthermia, no subjective hypothermia Eyes: no blurry vision, no xerophthalmia ENT: no sore throat, no nodules palpated in throat, no dysphagia, no odynophagia, no hoarseness Cardiovascular: no CP/no SOB/no palpitations/no leg swelling Respiratory: no cough/no SOB/no wheezing Gastrointestinal: no N/no V/no D/no C/no acid reflux Musculoskeletal: no muscle aches/no joint aches Skin: no rashes, no hair loss Neurological: no tremors/no numbness/no tingling/no dizziness  I reviewed pt's medications, allergies, PMH, social hx, family hx, and changes were documented in the history of present illness. Otherwise, unchanged from my initial visit note.  Past Medical History:  Diagnosis Date  . Anxiety   . Arthritis   . Cancer ([place])   . Cataract   . Chronic kidney disease   . Depression   . GERD (gastroesophageal reflux disease)   . Osteoporosis   . Sciatica   . UTI (urinary tract infection) 8/15  . Wears glasses    Past Surgical History:  Procedure Laterality Date  . CESAREAN SECTION    .  COLONOSCOPY    . CYST REMOVAL NECK Left 03/03/2014   Procedure: LEFT NECK CYST EXCISION;  Surgeon: Pedro Earls, MD;  Location: [place];  Service: General;  Laterality: Left;  . DILATION AND CURETTAGE OF UTERUS    . EYE SURGERY    . TONSILLECTOMY AND ADENOIDECTOMY    . UPPER GI ENDOSCOPY     History   Social History  . Marital Status: Married    Spouse Name: N/A  . Number of Children: 2   Occupational History  .  speech language pathologist   Social History Main Topics  . Smoking status: Former Smoker    Types:  Cigarettes    Quit date:  67   . Smokeless tobacco: Never Used  . Alcohol Use: Yes  . Drug Use: No   Current Outpatient Medications on File Prior to Visit  Medication Sig Dispense Refill  . Acetaminophen (TYLENOL ARTHRITIS PAIN PO) Take 650 mg by mouth 2 (two) times daily.    Marland Kitchen ALFALFA PO Take by mouth.    . ALOE VERA JUICE LIQD Take by mouth. 2 T. 2 x daily    . Calcium-Magnesium 500-250 MG TABS Take by mouth.    . Cholecalciferol (VITAMIN D PO) Take by mouth.    . Digestive Enzymes (DIGESTIVE ENZYME PO) Take by mouth.    . LECITHIN PO Take by mouth.    Marland Kitchen LORazepam (ATIVAN) 1 MG tablet TK 1/2 T PO Q 8 HOURS PRF ANXIETY  0  . Magnesium Hydroxide (MAGNESIA PO) Take by mouth.    . Multiple Vitamins-Minerals (PRESERVISION AREDS) CAPS Take 1 capsule by mouth daily.    . Omega-3 Fatty Acids (OMEGA 3 PO) Take by mouth.    Marland Kitchen omeprazole (PRILOSEC) 40 MG capsule Take 40 mg by mouth daily.    Marland Kitchen OVER THE COUNTER MEDICATION Gabatone PO    . OVER THE COUNTER MEDICATION Catecholacalm    . OVER THE COUNTER MEDICATION Areds; taking two tabs.    Marland Kitchen OVER THE COUNTER MEDICATION ALJ medication.    . polyvinyl alcohol (LIQUIFILM TEARS) 1.4 % ophthalmic solution Place 1-2 drops into both eyes as needed for dry eyes.    . progesterone (PROMETRIUM) 200 MG capsule TK 1 C PO QHS  12  . Specialty Vitamins Products (ONE-A-DAY BONE STRENGTH PO) Take by mouth.     No current facility-administered medications on file prior to visit.    Allergies  Allergen Reactions  . Bactrim [Sulfamethoxazole-Trimethoprim] Anaphylaxis, Hives and Rash  . Sulfamethoxazole-Trimethoprim Rash and Hives   Family History  Problem Relation Age of Onset  . Cancer Mother        breast  . Breast cancer Mother   . Hypertension Father   . Mental illness Sister   . Cancer Maternal Grandfather    PE: There were no vitals taken for this visit. There is no height or weight on file to calculate BMI. Wt Readings from Last 3  Encounters:  08/28/16 124 lb (56.2 kg)  09/02/15 118 lb (53.5 kg)  01/23/15 111 lb (50.3 kg)   Constitutional: normal weight, in NAD Eyes: PERRLA, EOMI, no exophthalmos ENT: moist mucous membranes, no thyromegaly, no cervical lymphadenopathy Cardiovascular: RRR, No MRG Respiratory: CTA B Gastrointestinal: abdomen soft, NT, ND, BS+ Musculoskeletal: no deformities, no kyphosis, strength intact in all 4 Skin: moist, warm, no rashes Neurological: no tremor with outstretched hands, DTR normal in all 4  Assessment: 1. Osteoporosis  Plan: 1. Osteoporosis -Likely postmenopausal, worse  after stopping p.o. bisphosphonate, which she has tolerated well -Reviewed together latest bone density scan from 05/2017: T-scores fairly stable -She deferred starting Prolia at that time. -She continues with exercise and also supplementing calcium and vitamin D.  At last visit, her vitamin D was in the lower limit of normal so I advised her to increase from 5000 units daily to 7000 units daily.  She is taking this dose now. -We again discussed that she continues to be at an increased risk for fracture based on the absolute values of her T-scores on the last bone density scan.  She would like to continue the bone supplement for now along with a vitamin D, but stay off antiresorptive's for now. -We will check her BMP and vitamin D level for now -We will plan to check another bone density in 05/2019 - will see pt back in 1 year.   Philemon Kingdom, MD PhD [facility] Endocrinology

## 2017-10-22 ENCOUNTER — Ambulatory Visit: Payer: Medicare Other | Admitting: Internal Medicine

## 2017-10-22 ENCOUNTER — Encounter: Payer: Self-pay | Admitting: Internal Medicine

## 2017-10-22 VITALS — BP 134/88 | HR 82 | Ht 64.0 in | Wt 124.2 lb

## 2017-10-22 DIAGNOSIS — M810 Age-related osteoporosis without current pathological fracture: Secondary | ICD-10-CM | POA: Diagnosis not present

## 2017-10-22 LAB — BASIC METABOLIC PANEL WITH GFR
BUN: 15 mg/dL (ref 7–25)
CALCIUM: 9.6 mg/dL (ref 8.6–10.4)
CO2: 31 mmol/L (ref 20–32)
CREATININE: 0.99 mg/dL (ref 0.50–0.99)
Chloride: 106 mmol/L (ref 98–110)
GFR, Est African American: 67 mL/min/{1.73_m2} (ref >=60)
GFR, Est Non African American: 58 mL/min/{1.73_m2} — ABNORMAL LOW (ref >=60)
GLUCOSE: 83 mg/dL (ref 65–99)
Potassium: 5 mmol/L (ref 3.5–5.3)
Sodium: 141 mmol/L (ref 135–146)

## 2017-10-22 LAB — VITAMIN D 25 HYDROXY (VIT D DEFICIENCY, FRACTURES): VITD: 37.68 ng/mL (ref 30.00–100.00)

## 2017-10-22 NOTE — Progress Notes (Signed)
Patient ID: Lisa Yoder, female   DOB: 1951-03-02, 67 y.o.   MRN: [id number]  HPI  Lisa Yoder is a 67 y.o.-year-old female, returning for f/u for osteoporosis. Last visit 1 year ago. ObGyn: Dr. Dian Queen.  No fracture since last visit.  Reviewed and addended history:  Pt was dx with OP in 2006.   + history of falls 1 year ago >> fell on deck >> no fractures.  No dizziness, vertigo, orthostasis.   I reviewed pt's DEXA scans: Date L1-L4 T score FN T score  05/28/2017 ([facility]) (L3): -3.3 (-0.9%) LFN: -2.8 (-2.2%*) RFN: -3.1  05/27/2015 ([place]) -3.0 LFN: -2.7 RFN: -3.1  05/25/2013 ([facility]) -3.5 (-10.4%* c/w 03/14/2009) LFN: -3.4 RFN: -3.5  03/14/2009 -2.8   03/03/2007 -2.7   04/13/2005 -2.5    She has been on the following OP treatments:  - Fosamax in 2006 >> for 1-2 years - Actonel >> for 2 more years.  She refused Prolia.  Denies any skeletal pain.  No history of vitamin D deficiency.  Reviewed latest vitamin D levels: Lab Results  Component Value Date   VD25OH 31.59 08/28/2016   VD25OH 43.57 09/02/2015   VD25OH 42 10/08/2014  At last visit, she was on 5000 units vitamin D daily, but as vitamin D level was close to the lower limit of normal, I advised her to increase to 7000 units daily >> actually taking <5000.  No weight bearing exercises - except 1x a week.  She does yoga 3-4x a week and water aerobics 1x.  She is not on high vitamin A doses.  No hyper/hypocalcemia.  No hyperparathyroidism.  No kidney stones. Lab Results  Component Value Date   CALCIUM 10.1 08/28/2016   CALCIUM 9.9 10/08/2014   CALCIUM 10.2 02/15/2014   CALCIUM 8.2 (L) 12/29/2013   CALCIUM 9.4 12/28/2013   No thyrotoxicosis.  Reviewed TSH levels levels:  Lab Results  Component Value Date   TSH 2.471 10/08/2014   TSH 1.710 12/30/2013   No h/o CKD.  Reviewed latest BUN/creatinine: Cr: Lab Results  Component Value Date   BUN 19 08/28/2016   CREATININE  0.94 08/28/2016   Menopause was at 67 years old.  She does have a family history of osteoporosis in her sister, who is on Forteo.  She also has a history of small hiatal hernia and dysphagia.  ROS: Constitutional: no weight gain/no weight loss, no fatigue, no subjective hyperthermia, no subjective hypothermia Eyes: no blurry vision, no xerophthalmia ENT: no sore throat, no nodules palpated in throat, + dysphagia, no odynophagia, no hoarseness Cardiovascular: no CP/no SOB/no palpitations/no leg swelling Respiratory: no cough/no SOB/no wheezing Gastrointestinal: no N/no V/no D/+ C/no acid reflux Musculoskeletal: no muscle aches/no joint aches Skin: no rashes, no hair loss Neurological: no tremors/no numbness/no tingling/no dizziness  I reviewed pt's medications, allergies, PMH, social hx, family hx, and changes were documented in the history of present illness. Otherwise, unchanged from my initial visit note. She started Pristiq, Meloxicam.  Past Medical History:  Diagnosis Date  . Anxiety   . Arthritis   . Cancer ([place])   . Cataract   . Chronic kidney disease   . Depression   . GERD (gastroesophageal reflux disease)   . Osteoporosis   . Sciatica   . UTI (urinary tract infection) 8/15  . Wears glasses    Past Surgical History:  Procedure Laterality Date  . CESAREAN SECTION    . COLONOSCOPY    . CYST REMOVAL NECK  Left 03/03/2014   Procedure: LEFT NECK CYST EXCISION;  Surgeon: Pedro Earls, MD;  Location: [facility];  Service: General;  Laterality: Left;  . DILATION AND CURETTAGE OF UTERUS    . EYE SURGERY    . TONSILLECTOMY AND ADENOIDECTOMY    . UPPER GI ENDOSCOPY     History   Social History  . Marital Status: Married    Spouse Name: N/A  . Number of Children: 2   Occupational History  .  speech language pathologist   Social History Main Topics  . Smoking status: Former Smoker    Types: Cigarettes    Quit date:  29   . Smokeless tobacco:  Never Used  . Alcohol Use: Yes  . Drug Use: No   Current Outpatient Medications on File Prior to Visit  Medication Sig Dispense Refill  . Acetaminophen (TYLENOL ARTHRITIS PAIN PO) Take 650 mg by mouth 2 (two) times daily.    Marland Kitchen ALFALFA PO Take by mouth.    . ALOE VERA JUICE LIQD Take by mouth. 2 T. 2 x daily    . Calcium-Magnesium 500-250 MG TABS Take by mouth.    . Cholecalciferol (VITAMIN D PO) Take by mouth.    . Digestive Enzymes (DIGESTIVE ENZYME PO) Take by mouth.    . LECITHIN PO Take by mouth.    Marland Kitchen LORazepam (ATIVAN) 1 MG tablet TK 1/2 T PO Q 8 HOURS PRF ANXIETY  0  . Magnesium Hydroxide (MAGNESIA PO) Take by mouth.    . Multiple Vitamins-Minerals (PRESERVISION AREDS) CAPS Take 1 capsule by mouth daily.    . Omega-3 Fatty Acids (OMEGA 3 PO) Take by mouth.    Marland Kitchen omeprazole (PRILOSEC) 40 MG capsule Take 40 mg by mouth daily.    Marland Kitchen OVER THE COUNTER MEDICATION Gabatone PO    . OVER THE COUNTER MEDICATION Catecholacalm    . OVER THE COUNTER MEDICATION Areds; taking two tabs.    Marland Kitchen OVER THE COUNTER MEDICATION ALJ medication.    . polyvinyl alcohol (LIQUIFILM TEARS) 1.4 % ophthalmic solution Place 1-2 drops into both eyes as needed for dry eyes.    . progesterone (PROMETRIUM) 200 MG capsule TK 1 C PO QHS  12  . Specialty Vitamins Products (ONE-A-DAY BONE STRENGTH PO) Take by mouth.     No current facility-administered medications on file prior to visit.    Allergies  Allergen Reactions  . Bactrim [Sulfamethoxazole-Trimethoprim] Anaphylaxis, Hives and Rash  . Sulfamethoxazole-Trimethoprim Rash and Hives   Family History  Problem Relation Age of Onset  . Cancer Mother        breast  . Breast cancer Mother   . Hypertension Father   . Mental illness Sister   . Cancer Maternal Grandfather    PE: BP 134/88   Pulse 82   Ht 5\' 4"  (1.626 m)   Wt 124 lb 3.2 oz (56.3 kg)   SpO2 97%   BMI 21.32 kg/m  Body mass index is 21.32 kg/m. Wt Readings from Last 3 Encounters:  10/22/17 124  lb 3.2 oz (56.3 kg)  08/28/16 124 lb (56.2 kg)  09/02/15 118 lb (53.5 kg)   Constitutional: Normal weight, in NAD Eyes: PERRLA, EOMI, no exophthalmos ENT: moist mucous membranes, no thyromegaly, no cervical lymphadenopathy Cardiovascular: RRR, No MRG Respiratory: CTA B Gastrointestinal: abdomen soft, NT, ND, BS+ Musculoskeletal: no deformities - no kyphosis, strength intact in all 4 Skin: moist, warm, no rashes Neurological: no tremor with outstretched hands, + mild  head tremor, DTR normal in all 4  Assessment: 1. Osteoporosis  Plan: 1. Osteoporosis  Likely menopausal, worse after stopping p.o. bisphosphonates, which she tolerated well  We reviewed together her latest bone density scan from 05/2017: T-scores were fairly stable  She deferred starting Prolia at that time  She continues to exercise and also supplementing calcium and vitamin D. As she is not doing enough weight bearing exercise, we discussed about increasing this  At last visit, vitamin D was at the lower limit of normal, so we increased her supplement from 5000 units to 7000 units daily.  However, she decreased the dose to 2000 units since (?)  We again discussed that she continues to be at increased risk for fractures, based on the absolute values of her T-scores on the last bone density scan.  She would like to continue the supplements for now, along with her vitamin D, but stay off antiresorptives  We will check her BMP and vitamin D level now  Plan to check another bone density in 05/2019  We will see her back in 1 year.  - time spent with the patient: 25 min, of which >50% was spent in obtaining information about her symptoms, interval hx since last visit, reviewing her previous labs, evaluations, and treatments, counseling her about her condition (please see the discussed topics above), and developing a plan to further investigate and treat it; she had a number of questions which I addressed.  Component      Latest Ref Rng & Units 10/22/2017  Glucose     65 - 99 mg/dL 83  BUN     7 - 25 mg/dL 15  Creatinine     0.50 - 0.99 mg/dL 0.99  GFR, Est Non African American     > OR = 60 mL/min/1.70m2 58 (L)  GFR, Est African American     > OR = 60 mL/min/1.53m2 67  BUN/Creatinine Ratio     6 - 22 (calc) NOT APPLICABLE  Sodium     938 - 146 mmol/L 141  Potassium     3.5 - 5.3 mmol/L 5.0  Chloride     98 - 110 mmol/L 106  CO2     20 - 32 mmol/L 31  Calcium     8.6 - 10.4 mg/dL 9.6  VITD     30.00 - 100.00 ng/mL 37.68   Stable labs. Vit D better. Will increase vitamin d dose to 4000 units daily.  Philemon Kingdom, MD PhD [facility]

## 2017-10-22 NOTE — Patient Instructions (Signed)
Please continue the current vitamin D dose.  Please stop at the lab.  Please come back for a follow-up appointment in 1 year.

## 2018-03-16 DIAGNOSIS — C50911 Malignant neoplasm of unspecified site of right female breast: Secondary | ICD-10-CM

## 2018-03-16 HISTORY — DX: Malignant neoplasm of unspecified site of right female breast: C50.911

## 2018-03-26 ENCOUNTER — Other Ambulatory Visit: Payer: Self-pay | Admitting: Obstetrics and Gynecology

## 2018-03-26 DIAGNOSIS — R928 Other abnormal and inconclusive findings on diagnostic imaging of breast: Secondary | ICD-10-CM

## 2018-04-01 ENCOUNTER — Ambulatory Visit
Admission: RE | Admit: 2018-04-01 | Discharge: 2018-04-01 | Disposition: A | Discharge status: Home / Self Care | Payer: Medicare Other | Source: Ambulatory Visit | Attending: Obstetrics and Gynecology | Admitting: Obstetrics and Gynecology

## 2018-04-01 ENCOUNTER — Other Ambulatory Visit: Payer: Self-pay | Admitting: Obstetrics and Gynecology

## 2018-04-01 DIAGNOSIS — N6310 Unspecified lump in the right breast, unspecified quadrant: Secondary | ICD-10-CM

## 2018-04-01 DIAGNOSIS — R928 Other abnormal and inconclusive findings on diagnostic imaging of breast: Secondary | ICD-10-CM

## 2018-04-11 ENCOUNTER — Ambulatory Visit
Admission: RE | Admit: 2018-04-11 | Discharge: 2018-04-11 | Disposition: A | Discharge status: Home / Self Care | Payer: Medicare Other | Source: Ambulatory Visit | Attending: Obstetrics and Gynecology | Admitting: Obstetrics and Gynecology

## 2018-04-11 DIAGNOSIS — N6310 Unspecified lump in the right breast, unspecified quadrant: Secondary | ICD-10-CM

## 2018-04-15 HISTORY — PX: BREAST BIOPSY: SHX20

## 2018-04-21 ENCOUNTER — Other Ambulatory Visit: Payer: Self-pay | Admitting: Surgery

## 2018-04-21 DIAGNOSIS — C50911 Malignant neoplasm of unspecified site of right female breast: Secondary | ICD-10-CM

## 2018-04-23 ENCOUNTER — Telehealth: Payer: Self-pay

## 2018-04-23 NOTE — Telephone Encounter (Signed)
Notes on file, referral sent to scheduling.  

## 2018-04-26 ENCOUNTER — Ambulatory Visit
Admission: RE | Admit: 2018-04-26 | Discharge: 2018-04-26 | Disposition: A | Discharge status: Home / Self Care | Payer: Medicare Other | Source: Ambulatory Visit | Attending: Surgery | Admitting: Surgery

## 2018-04-26 DIAGNOSIS — C50911 Malignant neoplasm of unspecified site of right female breast: Secondary | ICD-10-CM

## 2018-04-26 MED ORDER — GADOBENATE DIMEGLUMINE 529 MG/ML IV SOLN
11.0000 mL | Freq: Once | INTRAVENOUS | Status: AC | PRN
  Administered 2018-04-26: 11 mL via INTRAVENOUS

## 2018-04-28 ENCOUNTER — Encounter (HOSPITAL_BASED_OUTPATIENT_CLINIC_OR_DEPARTMENT_OTHER): Payer: Self-pay | Admitting: *Deleted

## 2018-04-28 ENCOUNTER — Other Ambulatory Visit: Payer: Self-pay

## 2018-04-28 NOTE — Progress Notes (Signed)
Patient states that she has a "cold sore" on her nose that she has been treating with Abreva. States sore is drying up some. I advised her to call Dr Serita Grit office and let them know since this is so close to her eyes.

## 2018-04-29 ENCOUNTER — Other Ambulatory Visit: Payer: Self-pay | Admitting: Surgery

## 2018-04-29 ENCOUNTER — Ambulatory Visit: Payer: Self-pay | Admitting: Ophthalmology

## 2018-04-29 DIAGNOSIS — C50911 Malignant neoplasm of unspecified site of right female breast: Secondary | ICD-10-CM

## 2018-04-29 NOTE — H&P (Signed)
  Date of examination:  04/29/18  Indication for surgery: Intractable diplopia affecting daily activities  Pertinent past medical history:  Past Medical History:  Diagnosis Date  . Anxiety   . Arthritis   . Cancer ([place]) 03/2018   right breast cancer  . Cataract   . Chronic kidney disease   . Depression   . GERD (gastroesophageal reflux disease)   . 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 Osteoporosis   . Sciatica   . UTI (urinary tract infection) 8/15  . Wears glasses     Pertinent ocular history:  Persistent esotropia with diplopia   Pertinent family history:  Family History  Problem Relation Age of Onset  . Cancer Mother        breast  . Breast cancer Mother   . Hypertension Father   . Mental illness Sister   . Cancer Maternal Grandfather     General:  Healthy appearing patient in no distress.  Recent lobular breast carcinoma diagnosis. No contraindication to surgery per surgeon or internist, have advised she proceed if she and I agree reasonable.  Eyes:    Acuity OD 20/20  OS 20/25  cc   External: Within normal limits     Anterior segment: Within normal limits; PCIOLs in place OU  Motility:   20pd alternating poorly-controlled intermittent esotropia  Impression: 67yo female with persistent, stable diplopia signficantly affecting daily life and desiring repair  Plan: Strabismus surgery both eyes  Lamonte Sakai

## 2018-04-29 NOTE — H&P (Deleted)
  The note originally documented on this encounter has been moved the the encounter in which it belongs.  

## 2018-05-02 ENCOUNTER — Ambulatory Visit (HOSPITAL_BASED_OUTPATIENT_CLINIC_OR_DEPARTMENT_OTHER)
Admission: RE | Admit: 2018-05-02 | Discharge: 2018-05-02 | Disposition: A | Discharge status: Home / Self Care | Payer: Medicare Other | Source: Ambulatory Visit | Attending: Ophthalmology | Admitting: Ophthalmology

## 2018-05-02 ENCOUNTER — Ambulatory Visit (HOSPITAL_BASED_OUTPATIENT_CLINIC_OR_DEPARTMENT_OTHER): Payer: Medicare Other | Admitting: Anesthesiology

## 2018-05-02 ENCOUNTER — Encounter (HOSPITAL_BASED_OUTPATIENT_CLINIC_OR_DEPARTMENT_OTHER): Payer: Self-pay | Admitting: *Deleted

## 2018-05-02 ENCOUNTER — Other Ambulatory Visit: Payer: Self-pay

## 2018-05-02 ENCOUNTER — Encounter (HOSPITAL_BASED_OUTPATIENT_CLINIC_OR_DEPARTMENT_OTHER)
Admission: RE | Disposition: A | Discharge status: Home / Self Care | Payer: Self-pay | Source: Ambulatory Visit | Attending: Ophthalmology

## 2018-05-02 DIAGNOSIS — K219 Gastro-esophageal reflux disease without esophagitis: Secondary | ICD-10-CM | POA: Diagnosis not present

## 2018-05-02 DIAGNOSIS — N189 Chronic kidney disease, unspecified: Secondary | ICD-10-CM | POA: Insufficient documentation

## 2018-05-02 DIAGNOSIS — H532 Diplopia: Secondary | ICD-10-CM | POA: Insufficient documentation

## 2018-05-02 DIAGNOSIS — Z87891 Personal history of nicotine dependence: Secondary | ICD-10-CM | POA: Insufficient documentation

## 2018-05-02 DIAGNOSIS — Z853 Personal history of malignant neoplasm of breast: Secondary | ICD-10-CM | POA: Diagnosis not present

## 2018-05-02 DIAGNOSIS — H5000 Unspecified esotropia: Secondary | ICD-10-CM | POA: Diagnosis not present

## 2018-05-02 HISTORY — PX: STRABISMUS SURGERY: SHX218

## 2018-05-02 HISTORY — DX: Herpesviral infection, unspecified: B00.9

## 2018-05-02 SURGERY — REPAIR STRABISMUS
Anesthesia: General | Site: Eye | Laterality: Bilateral

## 2018-05-02 MED ORDER — TOBRAMYCIN-DEXAMETHASONE 0.3-0.1 % OP OINT: 1.0000 "application " | TOPICAL_OINTMENT | Freq: Three times a day (TID) | OPHTHALMIC | Status: DC

## 2018-05-02 MED ORDER — LIDOCAINE 2% (20 MG/ML) 5 ML SYRINGE
INTRAMUSCULAR | Status: AC
  Filled 2018-05-02: qty 5

## 2018-05-02 MED ORDER — PROPOFOL 10 MG/ML IV BOLUS
INTRAVENOUS | Status: DC | PRN
  Administered 2018-05-02: 100 mg via INTRAVENOUS

## 2018-05-02 MED ORDER — ATROPINE SULFATE 0.4 MG/ML IJ SOLN
INTRAMUSCULAR | Status: DC | PRN
  Administered 2018-05-02 (×2): 0.2 mg via INTRAVENOUS

## 2018-05-02 MED ORDER — ONDANSETRON HCL 4 MG/2ML IJ SOLN
INTRAMUSCULAR | Status: DC | PRN
  Administered 2018-05-02: 4 mg via INTRAVENOUS

## 2018-05-02 MED ORDER — BUPIVACAINE HCL 0.5 % IJ SOLN
INTRAMUSCULAR | Status: DC | PRN
  Administered 2018-05-02: 3 mL

## 2018-05-02 MED ORDER — FENTANYL CITRATE (PF) 100 MCG/2ML IJ SOLN
INTRAMUSCULAR | Status: AC
  Filled 2018-05-02: qty 2

## 2018-05-02 MED ORDER — BSS IO SOLN
INTRAOCULAR | Status: AC
  Filled 2018-05-02: qty 15

## 2018-05-02 MED ORDER — VALTREX 1 G PO TABS: 2000.0000 mg | ORAL_TABLET | Freq: Two times a day (BID) | ORAL | 0 refills | Status: AC

## 2018-05-02 MED ORDER — LACTATED RINGERS IV SOLN
INTRAVENOUS | Status: DC
  Administered 2018-05-02: 09:00:00 via INTRAVENOUS

## 2018-05-02 MED ORDER — GLYCOPYRROLATE PF 0.2 MG/ML IJ SOSY
PREFILLED_SYRINGE | INTRAMUSCULAR | Status: AC
  Filled 2018-05-02: qty 1

## 2018-05-02 MED ORDER — SCOPOLAMINE 1 MG/3DAYS TD PT72: 1.0000 | MEDICATED_PATCH | Freq: Once | TRANSDERMAL | Status: DC | PRN

## 2018-05-02 MED ORDER — FENTANYL CITRATE (PF) 100 MCG/2ML IJ SOLN
50.0000 ug | INTRAMUSCULAR | Status: DC | PRN
  Administered 2018-05-02: 100 ug via INTRAVENOUS

## 2018-05-02 MED ORDER — MIDAZOLAM HCL 2 MG/2ML IJ SOLN
1.0000 mg | INTRAMUSCULAR | Status: DC | PRN
  Administered 2018-05-02: 2 mg via INTRAVENOUS

## 2018-05-02 MED ORDER — BSS IO SOLN
INTRAOCULAR | Status: DC | PRN
  Administered 2018-05-02: 15 mL

## 2018-05-02 MED ORDER — TOBRAMYCIN-DEXAMETHASONE 0.3-0.1 % OP SUSP
OPHTHALMIC | Status: AC
  Filled 2018-05-02: qty 2.5

## 2018-05-02 MED ORDER — TOBRAMYCIN-DEXAMETHASONE 0.3-0.1 % OP SUSP
OPHTHALMIC | Status: DC | PRN
  Administered 2018-05-02: 2 [drp] via OPHTHALMIC

## 2018-05-02 MED ORDER — DEXAMETHASONE SODIUM PHOSPHATE 4 MG/ML IJ SOLN
INTRAMUSCULAR | Status: DC | PRN
  Administered 2018-05-02: 10 mg via INTRAVENOUS

## 2018-05-02 MED ORDER — KETOROLAC TROMETHAMINE 30 MG/ML IJ SOLN
INTRAMUSCULAR | Status: DC | PRN
  Administered 2018-05-02: 30 mg via INTRAVENOUS

## 2018-05-02 MED ORDER — DEXAMETHASONE SODIUM PHOSPHATE 10 MG/ML IJ SOLN
INTRAMUSCULAR | Status: AC
  Filled 2018-05-02: qty 1

## 2018-05-02 MED ORDER — PHENYLEPHRINE HCL 2.5 % OP SOLN
1.0000 [drp] | Freq: Once | OPHTHALMIC | Status: AC
  Administered 2018-05-02: 1 [drp] via OPHTHALMIC

## 2018-05-02 MED ORDER — KETOROLAC TROMETHAMINE 30 MG/ML IJ SOLN
INTRAMUSCULAR | Status: AC
  Filled 2018-05-02: qty 1

## 2018-05-02 MED ORDER — PHENYLEPHRINE HCL 2.5 % OP SOLN
OPHTHALMIC | Status: AC
  Filled 2018-05-02: qty 2

## 2018-05-02 MED ORDER — PROPOFOL 10 MG/ML IV BOLUS
INTRAVENOUS | Status: AC
  Filled 2018-05-02: qty 20

## 2018-05-02 MED ORDER — ONDANSETRON HCL 4 MG/2ML IJ SOLN
INTRAMUSCULAR | Status: AC
  Filled 2018-05-02: qty 2

## 2018-05-02 MED ORDER — MIDAZOLAM HCL 2 MG/2ML IJ SOLN
INTRAMUSCULAR | Status: AC
  Filled 2018-05-02: qty 2

## 2018-05-02 SURGICAL SUPPLY — 36 items
APL SRG 3 HI ABS STRL LF PLS (MISCELLANEOUS) ×1
APL SWBSTK 6 STRL LF DISP (MISCELLANEOUS) ×2
APPLICATOR COTTON TIP 6 STRL (MISCELLANEOUS) ×2 IMPLANT
APPLICATOR COTTON TIP 6IN STRL (MISCELLANEOUS) ×4
APPLICATOR DR MATTHEWS STRL (MISCELLANEOUS) ×2 IMPLANT
BNDG EYE OVAL (GAUZE/BANDAGES/DRESSINGS) ×2 IMPLANT
CAUTERY EYE LOW TEMP 1300F FIN (OPHTHALMIC RELATED) IMPLANT
CORD BIPOLAR FORCEPS 12FT (ELECTRODE) ×2 IMPLANT
COVER BACK TABLE 60X90IN (DRAPES) ×2 IMPLANT
COVER MAYO STAND STRL (DRAPES) ×2 IMPLANT
COVER WAND RF STERILE (DRAPES) IMPLANT
DRAPE SURG 17X23 STRL (DRAPES) IMPLANT
DRAPE U-SHAPE 76X120 STRL (DRAPES) ×2 IMPLANT
GLOVE BIO SURGEON STRL SZ 6.5 (GLOVE) ×3 IMPLANT
GLOVE BIO SURGEON STRL SZ7 (GLOVE) ×2 IMPLANT
GLOVE BIOGEL PI IND STRL 7.0 (GLOVE) IMPLANT
GLOVE BIOGEL PI INDICATOR 7.0 (GLOVE) ×2
GOWN STRL REUS W/ TWL LRG LVL3 (GOWN DISPOSABLE) ×2 IMPLANT
GOWN STRL REUS W/TWL LRG LVL3 (GOWN DISPOSABLE) ×4
NS IRRIG 1000ML POUR BTL (IV SOLUTION) ×2 IMPLANT
PACK BASIN DAY SURGERY FS (CUSTOM PROCEDURE TRAY) ×2 IMPLANT
SHEILD EYE MED CORNL SHD 22X21 (OPHTHALMIC RELATED)
SHIELD EYE MED CORNL SHD 22X21 (OPHTHALMIC RELATED) IMPLANT
SPEAR EYE SURG WECK-CEL (MISCELLANEOUS) ×3 IMPLANT
STRIP CLOSURE SKIN 1/4X4 (GAUZE/BANDAGES/DRESSINGS) IMPLANT
SUT 6 0 SILK T G140 8DA (SUTURE) IMPLANT
SUT CHROMIC 7 0 TG140 8 (SUTURE) ×2 IMPLANT
SUT MERSILENE 5-0 (SUTURE) IMPLANT
SUT SILK 4 0 C 3 735G (SUTURE) IMPLANT
SUT VICRYL 6 0 S 28 (SUTURE) IMPLANT
SUT VICRYL ABS 6-0 S29 18IN (SUTURE) ×2 IMPLANT
SYR 10ML LL (SYRINGE) ×2 IMPLANT
SYR 3ML 18GX1 1/2 (SYRINGE) ×2 IMPLANT
TOWEL GREEN STERILE FF (TOWEL DISPOSABLE) ×2 IMPLANT
TOWEL OR NON WOVEN STRL DISP B (DISPOSABLE) ×2 IMPLANT
TRAY DSU PREP LF (CUSTOM PROCEDURE TRAY) ×2 IMPLANT

## 2018-05-02 NOTE — Op Note (Signed)
05/02/2018  11:14 AM  PATIENT:  Lisa Yoder  67 y.o. female  PRE-OPERATIVE DIAGNOSIS:  Esotropia     POST-OPERATIVE DIAGNOSIS:  Esotropia     PROCEDURE:  Medial rectus muscle recession  3.56mm  both eyes  SURGEON:  Annita Brod, M.D.   ANESTHESIA:   local and general  COMPLICATIONS:None  DESCRIPTION OF PROCEDURE: The patient was taken to the operating room where She was identified by me. General anesthesia was induced without difficulty after placement of appropriate monitors. The patient was prepped and draped in standard sterile fashion. A lid speculum was placed in the right eye.  Through an inferonasal fornix incision through conjunctiva and Tenon's fascia, the right medial rectus muscle was engaged on a series of muscle hooks and cleared of its fascial attachments. The muscle belly was noted to be broad and thick. The tendon was secured with a double-armed 6-0 Vicryl suture with a double locking bite at each border of the muscle, 1 mm from the insertion. The muscle was disinserted, and was reattached to sclera at a measured distance of 3 millimeters posterior to the original insertion, using direct scleral passes in crossed swords fashion.  The suture ends were tied securely after the position of the muscle had been checked and found to be accurate. Approximately 1.81mL of 0.75% bupivacaine was injected peribulbar for postoperative anesthesia. Conjunctiva was closed with 2 6-0 Vicryl sutures.  The speculum was transferred to the left eye, where again the muscle belly was noted to be broad and thick; an identical procedure was performed, again effecting a 3 millimeters recession of the medial rectus muscle.  Tobradex eye drops were placed in each eye. The patient was awakened without difficulty and taken to the recovery room in stable condition, having suffered no intraoperative or immediate postoperative complications.  Annita Brod, M.D.    PATIENT DISPOSITION:   PACU - hemodynamically stable.

## 2018-05-02 NOTE — Anesthesia Procedure Notes (Signed)
Procedure Name: LMA Insertion Date/Time: 05/02/2018 10:25 AM Performed by: Lyndee Leo, CRNA Pre-anesthesia Checklist: Patient identified, Emergency Drugs available, Suction available and Patient being monitored Patient Re-evaluated:Patient Re-evaluated prior to induction Oxygen Delivery Method: Circle system utilized Preoxygenation: Pre-oxygenation with 100% oxygen Induction Type: IV induction Ventilation: Mask ventilation without difficulty LMA: LMA flexible inserted LMA Size: 4.0 Number of attempts: 1 Airway Equipment and Method: Bite block Placement Confirmation: positive ETCO2 Tube secured with: Tape Dental Injury: Teeth and Oropharynx as per pre-operative assessment

## 2018-05-02 NOTE — Anesthesia Preprocedure Evaluation (Signed)
Anesthesia Evaluation  Patient identified by MRN, date of birth, ID band Patient awake    Reviewed: Allergy & Precautions, NPO status , Patient's Chart, lab work & pertinent test results  Airway Mallampati: II  TM Distance: >3 FB Neck ROM: Full    Dental  (+) Teeth Intact, Dental Advisory Given   Pulmonary former smoker,    breath sounds clear to auscultation       Cardiovascular  Rhythm:Regular Rate:Normal     Neuro/Psych PSYCHIATRIC DISORDERS Anxiety Depression  Neuromuscular disease    GI/Hepatic GERD  Medicated and Controlled,  Endo/Other    Renal/GU Renal InsufficiencyRenal disease     Musculoskeletal   Abdominal   Peds  Hematology   Anesthesia Other Findings   Reproductive/Obstetrics                             Anesthesia Physical  Anesthesia Plan  ASA: II  Anesthesia Plan: General   Post-op Pain Management:    Induction: Intravenous  PONV Risk Score and Plan: 3 and Ondansetron, Dexamethasone and Scopolamine patch - Pre-op  Airway Management Planned: LMA  Additional Equipment:   Intra-op Plan:   Post-operative Plan: Extubation in OR  Informed Consent: I have reviewed the patients History and Physical, chart, labs and discussed the procedure including the risks, benefits and alternatives for the proposed anesthesia with the patient or authorized representative who has indicated his/her understanding and acceptance.   Dental advisory given  Plan Discussed with: CRNA and Anesthesiologist  Anesthesia Plan Comments:         Anesthesia Quick Evaluation

## 2018-05-02 NOTE — Discharge Instructions (Signed)
No ibuprofen products until after 5:00pm today.  Diet: Clear liquids, advance to soft foods then regular diet as tolerated.  Pain control:   1)  Ibuprofen 600 mg by mouth every 4-6 hours as needed for pain  2) Acetaminophen 325 one or two by mouth every 4-6 hours as needed for pain that is not resolved by ibuprofen  Eye medications:  Tobradex or Zylet eye drops or ointment, one drop or application in the operated eye(s) 3 times a day for 7 days.    Systemic medications: Valacyclovir for suspected resolving herpetic infection of the face. (Discussed with primary care MD by phone postop and Rx should be available at pt pharmacy.)  Activity: No swimming for 1 week. It is OK to let water run over the face and eyes while showering or taking a bath, even during the first week.  No other restriction on exercise or activity.  Eye movement: The eyes may look very slightly crossed in or turned out. This is not unusual postoperatively and may happen up to two months after surgery while the muscles are healing. The eyes may be tired during the first few weeks after surgery; reading can be uncomfortable during the healing process but will not hurt the eyes.    Call Dr. Serita Grit office [phone] with any problems or concerns.    Post Anesthesia Home Care Instructions  Activity: Get plenty of rest for the remainder of the day. A responsible individual must stay with you for 24 hours following the procedure.  For the next 24 hours, DO NOT: -Drive a car -Paediatric nurse -Drink alcoholic beverages -Take any medication unless instructed by your physician -Make any legal decisions or sign important papers.  Meals: Start with liquid foods such as gelatin or soup. Progress to regular foods as tolerated. Avoid greasy, spicy, heavy foods. If nausea and/or vomiting occur, drink only clear liquids until the nausea and/or vomiting subsides. Call your physician if vomiting continues.  Special  Instructions/Symptoms: Your throat may feel dry or sore from the anesthesia or the breathing tube placed in your throat during surgery. If this causes discomfort, gargle with warm salt water. The discomfort should disappear within 24 hours.  If you had a scopolamine patch placed behind your ear for the management of post- operative nausea and/or vomiting:  1. The medication in the patch is effective for 72 hours, after which it should be removed.  Wrap patch in a tissue and discard in the trash. Wash hands thoroughly with soap and water. 2. You may remove the patch earlier than 72 hours if you experience unpleasant side effects which may include dry mouth, dizziness or visual disturbances. 3. Avoid touching the patch. Wash your hands with soap and water after contact with the patch.

## 2018-05-02 NOTE — Transfer of Care (Signed)
Immediate Anesthesia Transfer of Care Note  Patient: Lisa Yoder  Procedure(s) Performed: BILATERAL REPAIR STRABISMUS (Bilateral Eye)  Patient Location: PACU  Anesthesia Type:General  Level of Consciousness: awake, alert  and oriented  Airway & Oxygen Therapy: Patient Spontanous Breathing and Patient connected to face mask oxygen  Post-op Assessment: Report given to RN and Post -op Vital signs reviewed and stable  Post vital signs: Reviewed and stable  Last Vitals:  Vitals Value Taken Time  BP    Temp    Pulse 96 05/02/2018 11:31 AM  Resp 14 05/02/2018 11:31 AM  SpO2 100 % 05/02/2018 11:31 AM  Vitals shown include unvalidated device data.  Last Pain:  Vitals:   05/02/18 0834  TempSrc: Oral  PainSc: 0-No pain         Complications: No apparent anesthesia complications

## 2018-05-04 NOTE — Anesthesia Postprocedure Evaluation (Signed)
Anesthesia Post Note  Patient: Lisa Yoder  Procedure(s) Performed: BILATERAL REPAIR STRABISMUS (Bilateral Eye)     Patient location during evaluation: PACU Anesthesia Type: General Level of consciousness: sedated Pain management: pain level controlled Vital Signs Assessment: post-procedure vital signs reviewed and stable Respiratory status: spontaneous breathing and respiratory function stable Cardiovascular status: stable Postop Assessment: no apparent nausea or vomiting Anesthetic complications: no    Last Vitals:  Vitals:   05/02/18 1215 05/02/18 1249  BP: (!) 141/76 (!) 150/82  Pulse: 82 81  Resp: (!) 9 16  Temp:  36.6 C  SpO2: 94% 98%    Last Pain:  Vitals:   05/02/18 1249  TempSrc:   PainSc: 0-No pain                 Isolde Skaff DANIEL

## 2018-05-05 ENCOUNTER — Ambulatory Visit
Admission: RE | Admit: 2018-05-05 | Discharge: 2018-05-05 | Disposition: A | Discharge status: Home / Self Care | Payer: Medicare Other | Source: Ambulatory Visit | Attending: Surgery | Admitting: Surgery

## 2018-05-05 ENCOUNTER — Encounter (HOSPITAL_BASED_OUTPATIENT_CLINIC_OR_DEPARTMENT_OTHER): Payer: Self-pay | Admitting: Ophthalmology

## 2018-05-05 DIAGNOSIS — C50911 Malignant neoplasm of unspecified site of right female breast: Secondary | ICD-10-CM

## 2018-05-05 MED ORDER — GADOBENATE DIMEGLUMINE 529 MG/ML IV SOLN
10.0000 mL | Freq: Once | INTRAVENOUS | Status: AC | PRN
  Administered 2018-05-05: 10 mL via INTRAVENOUS

## 2018-05-07 ENCOUNTER — Telehealth: Payer: Self-pay | Admitting: Oncology

## 2018-05-07 ENCOUNTER — Telehealth: Payer: Self-pay | Admitting: Hematology and Oncology

## 2018-05-07 NOTE — Telephone Encounter (Signed)
Pt appt has been rescheduled to see Dr. Lindi Adie on 10/24 at 1pm.

## 2018-05-07 NOTE — Telephone Encounter (Signed)
New referral received from Dr. Georgette Dover for invasive lobular carcinoma. Pt has been cld and scheduled to see Dr. Jana Hakim on 10/24 at 4pm. Pt aware to arrive 30 minutes early for labs.

## 2018-05-08 ENCOUNTER — Inpatient Hospital Stay: Payer: Medicare Other | Admitting: Oncology

## 2018-05-08 ENCOUNTER — Inpatient Hospital Stay: Payer: Medicare Other

## 2018-05-08 ENCOUNTER — Encounter: Payer: Self-pay | Admitting: *Deleted

## 2018-05-08 ENCOUNTER — Inpatient Hospital Stay: Payer: Medicare Other | Attending: Oncology | Admitting: Hematology and Oncology

## 2018-05-08 DIAGNOSIS — Z170 Estrogen receptor positive status [ER+]: Secondary | ICD-10-CM | POA: Diagnosis not present

## 2018-05-08 DIAGNOSIS — C50411 Malignant neoplasm of upper-outer quadrant of right female breast: Secondary | ICD-10-CM

## 2018-05-08 DIAGNOSIS — Z9011 Acquired absence of right breast and nipple: Secondary | ICD-10-CM

## 2018-05-08 DIAGNOSIS — Z87891 Personal history of nicotine dependence: Secondary | ICD-10-CM

## 2018-05-08 MED ORDER — TAMOXIFEN CITRATE 20 MG PO TABS: 20.0000 mg | ORAL_TABLET | Freq: Every day | ORAL | 3 refills | Status: DC

## 2018-05-08 NOTE — Progress Notes (Signed)
Location of Breast Cancer: Malignant neoplasm of upper outer quadrant of right breast, ER/PR +  Did patient present with symptoms (if so, please note symptoms) or was this found on screening mammography?: Routine mammogram.  Ultrasound: irregular hypoechoic mass with posterior acoustic shadowing and associated vascularity at the 1 o'clock position 4 cm from the nipple.  It measures 8 x 8 x 6 mm.  A second lesion at 1:00 8 cm from nipple, it measures 7 x 6 x 5 mm (not found at time of biopsy)  MRI Breast 04/26/2018: 1.5 cm biopsy proven invasive lobular carcinoma in the upper inner quadrant of right breast.  Multiple additional suspicious enhancing masses in the right breast.  Mammogram: possible right breast mass.  Histology per Pathology Report: Right Breast   Receptor Status: ER(+100%), PR (+70%), Her2-neu (-), Ki-67(5%)  Right Breast 04/11/2018  Receptor Status: ER(+ 20%), PR(+20%), Her2-neu(0), Ki-67(<1%)  Past/Anticipated interventions by surgeon, if any: Dr. Georgette Dover 04/21/2018 -We discussed her disease as well as the options for surgical treatment. -We discussed mastectomy versus breast conserving therapy. -We discussed the possibility of chemotherapy and/or radiation depending on the outcome of her surgical pathology. -I explained the rationale for obtaining a MRI. -We will discuss this further with the patient after her breast MRI. -Mastectomy per patient  Past/Anticipated interventions by medical oncology, if any: Chemotherapy  Dr. Lindi Adie 05/08/2018  -Patient had a screening mammogram that detected abnormality in the right breast which led to ultrasound and a biopsy which revealed invasive lobular cancer with LCIS that is ER/PR + Her2 -. -Patient had MRI of the breast that revealed additional masses which were also biopsied.  The additional masses came back as invasive lobular cancer with LCIS that is ER/PR +, Her 2 -. -Because of this Dr. Georgette Dover has recommended  mastectomy. -Patient is contemplating on getting genetic testing and whether or not she wants to do bilateral mastectomies.   -She is also planning to meet with plastic surgery to help make decisions regarding reconstruction. Recommendation -Mastectomy with sentinel lymph node biopsy. -Oncotype DX to determine if she would benefit from chemotherapy. -Adjuvant radiation only if she has positive margins or involved lymph nodes. -Followed by adjuvant antiestrogen therapy.  Because she has profound osteoporosis, I recommended tamoxifen. -Because patient wants to take time to determine what the best surgical approach, I recommended that she start on tamoxifen therapy, will start at 10 mg daily and then increase it to 20 mg if she tolerates it well. -return to clinic after surgery to discuss pathology report and to determine if Oncotype DX needs to be sent.   Lymphedema issues, if any: No  Pain issues, if any:  No  BP 138/79 (BP Location: Left Arm, Patient Position: Sitting)   Pulse 78   Temp 98.2 F (36.8 C) (Oral)   Resp 18   Ht 5' 3.5" (1.613 m)   Wt 130 lb 2 oz (59 kg)   SpO2 98%   BMI 22.69 kg/m    Wt Readings from Last 3 Encounters:  05/13/18 130 lb 2 oz (59 kg)  05/08/18 130 lb 14.4 oz (59.4 kg)  05/02/18 128 lb 8.5 oz (58.3 kg)   SAFETY ISSUES:  Prior radiation? No  Pacemaker/ICD? No  Possible current pregnancy? No  Is the patient on methotrexate? No  Current Complaints / other details:      Cori Razor, RN 05/08/2018,12:38 PM

## 2018-05-08 NOTE — Progress Notes (Signed)
[facility] CONSULT NOTE  Patient Care Team: Jonathon Jordan, MD as PCP - General (Family Medicine)  CHIEF COMPLAINTS/PURPOSE OF CONSULTATION:  Newly diagnosed breast cancer  HISTORY OF PRESENTING ILLNESS:  Lisa Yoder 67 y.o. female is here because of recent diagnosis of right breast cancer.  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  Patient had an MRI of the breast that revealed additional masses which were also biopsied.  The additional masses came back as invasive lobular cancer with LCIS that is ER PR positive HER-2 negative as well.  Because of this Dr. Georgette Dover has recommended mastectomy.  Patient is contemplating on getting genetic testing and whether or not she wants to do bilateral mastectomies.  She is also planning to meet with plastic surgery to help make decisions regarding reconstruction.  I reviewed her records extensively and collaborated the history with the patient.  SUMMARY OF ONCOLOGIC HISTORY:   Malignant neoplasm of upper-outer quadrant of right breast in female, estrogen receptor positive ([place])   05/05/2018 Initial Diagnosis    Screening detected right breast mass, MRI revealed UIQ 1.5 x 0.8 x 0.8 cm mass.  Additional masses 5 mm size and 7 mm size and several smaller enhancing masses in the right breast, biopsy of the 1.5 cm mass invasive lobular cancer with LCIS ER 20%, PR 20%, Ki-67 less than 1%, HER-2 negative; biopsy of additional masses showed ALH and ILC with LCIS ER 100%, PR 70%, Ki-67 5%, HER-2 1+ negative      MEDICAL HISTORY:  Past Medical History:  Diagnosis Date  . Anxiety   . Arthritis   . Cancer ([place]) 03/2018   right breast cancer  . Cataract   . Chronic kidney disease   . Depression   . GERD (gastroesophageal reflux disease)   . 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  Osteoporosis   . Sciatica   . UTI (urinary tract infection) 8/15  . Wears glasses     SURGICAL HISTORY: Past Surgical History:  Procedure Laterality Date  . CESAREAN SECTION    . COLONOSCOPY    . CYST REMOVAL NECK Left 03/03/2014   Procedure: LEFT NECK CYST EXCISION;  Surgeon: Pedro Earls, MD;  Location: [facility];  Service: General;  Laterality: Left;  . DILATION AND CURETTAGE OF UTERUS    . EYE SURGERY    . STRABISMUS SURGERY Bilateral 05/02/2018   Procedure: BILATERAL REPAIR STRABISMUS;  Surgeon: Lamonte Sakai, MD;  Location: [facility];  Service: Ophthalmology;  Laterality: Bilateral;  . TONSILLECTOMY    . TONSILLECTOMY AND ADENOIDECTOMY    . UPPER GI ENDOSCOPY      SOCIAL HISTORY: Social History   Socioeconomic History  . Marital status: Married    Spouse name: Not on file  . Number of children: Not on file  . Years of education: Not on file  . Highest education level: Not on file  Occupational History  . Not on file  Social Needs  . Financial resource strain: Not on file  . Food insecurity:    Worry: Not on file    Inability: Not on file  . Transportation needs:    Medical: Not on file    Non-medical: Not on file  Tobacco Use  . Smoking status: Former Smoker    Types: Cigarettes  Last attempt to quit: 12/23/1975    Years since quitting: 42.4  . Smokeless tobacco: Never Used  Substance and Sexual Activity  . Alcohol use: Yes    Comment: social  . Drug use: No  . Sexual activity: Not Currently    Birth control/protection: Post-menopausal  Lifestyle  . Physical activity:    Days per week: Not on file    Minutes per session: Not on file  . Stress: Not on file  Relationships  . Social connections:    Talks on phone: Not on file    Gets together: Not on file    Attends religious service: Not on file    Active member of club or organization: Not on file    Attends meetings of clubs or organizations: Not on file     Relationship status: Not on file  . Intimate partner violence:    Fear of current or ex partner: Not on file    Emotionally abused: Not on file    Physically abused: Not on file    Forced sexual activity: Not on file  Other Topics Concern  . Not on file  Social History Narrative  . Not on file    FAMILY HISTORY: Family History  Problem Relation Age of Onset  . Cancer Mother        breast  . Breast cancer Mother   . Hypertension Father   . Mental illness Sister   . Cancer Maternal Grandfather     ALLERGIES:  is allergic to bactrim [sulfamethoxazole-trimethoprim].  MEDICATIONS:  Current Outpatient Medications  Medication Sig Dispense Refill  . Brimonidine Tartrate (LUMIFY) 0.025 % SOLN Apply to eye.    . Calcium-Magnesium 500-250 MG TABS Take by mouth.    . Cholecalciferol (VITAMIN D3 PO) Take 1 tablet by mouth 2 (two) times daily.     . cyanocobalamin 1000 MCG tablet Take 5,000 mcg by mouth daily.    Marland Kitchen desvenlafaxine (PRISTIQ) 50 MG 24 hr tablet Take 1 tablet by mouth daily.  11  . Diethylpropion HCl 25 MG TABS Take by mouth.    . Digestive Enzymes (DIGESTIVE ENZYME PO) Take by mouth.    . Docosanol (ABREVA) 10 % CREA Apply topically.    . Hypochlorous Acid 0.012 % SOLN Apply topically.    . Hypromellose (ARTIFICIAL TEARS OP) Apply to eye.    . linaclotide (LINZESS) 290 MCG CAPS capsule Take 290 mcg by mouth daily before breakfast.    . LORazepam (ATIVAN) 1 MG tablet TK 1/2 T PO Q 8 HOURS PRF ANXIETY  0  . Magnesium Gluconate (MAG-G) 500 (27 Mg) MG TABS Take by mouth.    . meloxicam (MOBIC) 15 MG tablet Take 15 mg by mouth daily.    . Multiple Vitamins-Minerals (PRESERVISION AREDS) CAPS Take 1 capsule by mouth daily.    . Omega-3 Fatty Acids (OMEGA 3 PO) Take by mouth.    Marland Kitchen Specialty Vitamins Products (ONE-A-DAY BONE STRENGTH PO) Take by mouth.    . tamoxifen (NOLVADEX) 20 MG tablet Take 1 tablet (20 mg total) by mouth daily. 90 tablet 3  . tobramycin-dexamethasone  (TOBRADEX) ophthalmic ointment Place 1 application into both eyes 3 (three) times daily. For one week    . Turmeric 500 MG CAPS Take by mouth.     No current facility-administered medications for this visit.     REVIEW OF SYSTEMS:   Constitutional: Denies fevers, chills or abnormal night sweats Eyes: Denies blurriness of vision, double vision or watery eyes  Ears, nose, mouth, throat, and face: Denies mucositis or sore throat Respiratory: Denies cough, dyspnea or wheezes Cardiovascular: Denies palpitation, chest discomfort or lower extremity swelling Gastrointestinal:  Denies nausea, heartburn or change in bowel habits Skin: Denies abnormal skin rashes Lymphatics: Denies new lymphadenopathy or easy bruising Neurological:Denies numbness, tingling or new weaknesses Behavioral/Psych: Mood is stable, no new changes    All other systems were reviewed with the patient and are negative.  PHYSICAL EXAMINATION: ECOG PERFORMANCE STATUS: 1 - Symptomatic but completely ambulatory  Vitals:   05/08/18 1303  BP: (!) 155/75  Pulse: 82  Resp: 17  Temp: 97.7 F (36.5 C)  SpO2: 98%   Filed Weights   05/08/18 1303  Weight: 130 lb 14.4 oz (59.4 kg)    GENERAL:alert, no distress and comfortable SKIN: skin color, texture, turgor are normal, no rashes or significant lesions EYES: normal, conjunctiva are pink and non-injected, sclera clear OROPHARYNX:no exudate, no erythema and lips, buccal mucosa, and tongue normal  NECK: supple, thyroid normal size, non-tender, without nodularity LYMPH:  no palpable lymphadenopathy in the cervical, axillary or inguinal LUNGS: clear to auscultation and percussion with normal breathing effort HEART: regular rate & rhythm and no murmurs and no lower extremity edema ABDOMEN:abdomen soft, non-tender and normal bowel sounds Musculoskeletal:no cyanosis of digits and no clubbing  PSYCH: alert & oriented x 3 with fluent speech NEURO: no focal motor/sensory  deficits This is making sure that he has any pediatric document  LABORATORY DATA:  I have reviewed the data as listed Lab Results  Component Value Date   WBC 5.6 10/08/2014   HGB 13.1 10/08/2014   HCT 42.4 10/08/2014   MCV 91.8 10/08/2014   PLT 251 02/15/2014   Lab Results  Component Value Date   NA 141 10/22/2017   K 5.0 10/22/2017   CL 106 10/22/2017   CO2 31 10/22/2017    RADIOGRAPHIC STUDIES: I have personally reviewed the radiological reports and agreed with the findings in the report.  ASSESSMENT AND PLAN:  Malignant neoplasm of upper-outer quadrant of right breast in female, estrogen receptor positive ([place]) 05/05/18: Right breast mass, MRI revealed UIQ 1.5 x 0.8 x 0.8 cm mass.  Additional masses 5 mm size and 7 mm size and several smaller enhancing masses in the right breast, biopsy of the 1.5 cm mass invasive lobular cancer with LCIS ER 20%, PR 20%, Ki-67 less than 1%, HER-2 negative; biopsy of additional masses showed ALH and ILC with LCIS ER 100%, PR 70%, Ki-67 5%, HER-2 1+ negative  Pathology and radiology counseling: Discussed with the patient, the details of pathology including the type of breast cancer,the clinical staging, the significance of ER, PR and HER-2/neu receptors and the implications for treatment. After reviewing the pathology in detail, we proceeded to discuss the different treatment options between surgery, radiation, chemotherapy, antiestrogen therapies.  Recommendation: 1.  Mastectomy with sentinel lymph node biopsy 2. Oncotype DX to determine if she would benefit from chemotherapy 3.  Adjuvant radiation only if she has positive margins or involved lymph nodes 4.  Followed by adjuvant antiestrogen therapy.  Because she has profound osteoporosis, I recommended tamoxifen.  Because patient wants to take time to determine what the best surgical approach, I recommended that she start on tamoxifen therapy will start at 10 mg daily and then increase it to 20  mg if she tolerates it well.  Tamoxifen counseling: We discussed the risks and benefits of tamoxifen. These include but not limited to insomnia, hot flashes, mood  changes, vaginal dryness, and weight gain. Although rare, serious side effects including endometrial cancer, risk of blood clots were also discussed. We strongly believe that the benefits far outweigh the risks. Patient understands these risks and consented to starting treatment. Planned treatment duration is 5-10 years.  Return to clinic after surgery to discuss pathology report and to determine if Oncotype DX needs to be sent.       All questions were answered. The patient knows to call the clinic with any problems, questions or concerns.    Harriette Ohara, MD 05/08/18

## 2018-05-08 NOTE — Assessment & Plan Note (Signed)
05/05/18: Right breast mass, MRI revealed UIQ 1.5 x 0.8 x 0.8 cm mass.  Additional masses 5 mm size and 7 mm size and several smaller enhancing masses in the right breast, biopsy of the 1.5 cm mass invasive lobular cancer with LCIS ER 20%, PR 20%, Ki-67 less than 1%, HER-2 negative; biopsy of additional masses showed ALH and ILC with LCIS ER 100%, PR 70%, Ki-67 5%, HER-2 1+ negative  Pathology and radiology counseling: Discussed with the patient, the details of pathology including the type of breast cancer,the clinical staging, the significance of ER, PR and HER-2/neu receptors and the implications for treatment. After reviewing the pathology in detail, we proceeded to discuss the different treatment options between surgery, radiation, chemotherapy, antiestrogen therapies.  Recommendation: 1.  Mastectomy with sentinel lymph node biopsy 2. Oncotype DX to determine if she would benefit from chemotherapy 3.  Adjuvant radiation only if she has positive margins or involved lymph nodes 4.  Followed by adjuvant antiestrogen therapy.  Because she has profound osteoporosis, I recommended tamoxifen.  Because patient wants to take time to determine what the best surgical approach, I recommended that she start on tamoxifen therapy will start at 10 mg daily and then increase it to 20 mg if she tolerates it well.  Tamoxifen counseling: We discussed the risks and benefits of tamoxifen. These include but not limited to insomnia, hot flashes, mood changes, vaginal dryness, and weight gain. Although rare, serious side effects including endometrial cancer, risk of blood clots were also discussed. We strongly believe that the benefits far outweigh the risks. Patient understands these risks and consented to starting treatment. Planned treatment duration is 5-10 years.  Return to clinic after surgery to discuss pathology report and to determine if Oncotype DX needs to be sent.

## 2018-05-09 ENCOUNTER — Encounter: Payer: Self-pay | Admitting: Plastic Surgery

## 2018-05-09 ENCOUNTER — Telehealth: Payer: Self-pay | Admitting: *Deleted

## 2018-05-09 NOTE — Telephone Encounter (Signed)
Appts scheduled patient notified per 10/24 sch msg °

## 2018-05-12 NOTE — Progress Notes (Signed)
Radiation Oncology         [phone] ________________________________  Name: Lisa Yoder        MRN: [id number]  Date of Service: 05/13/2018 DOB: 1951-07-13  CC:Jonathon Jordan, MD  Donnie Mesa, MD     REFERRING PHYSICIAN: Donnie Mesa, MD   DIAGNOSIS: The encounter diagnosis was Malignant neoplasm of upper-outer quadrant of right breast in female, estrogen receptor positive ([facility]).   HISTORY OF PRESENT ILLNESS: Lisa Yoder is a 67 y.o. female seen at the request of Dr. Lindi Adie for a new diagnosis of right breast cancer.  The patient underwent screening mammogram that revealed an abnormality in her right breast.  This revealed a hypoechoic mass at the 1 o'clock position measuring 8 x 8 x 6 mm, and an architectural distortion adjacent to the site measured 7 x 6 x 5 mm.  Her axilla was negative for adenopathy.   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  Given the lobular phenotype she underwent an MRI of bilateral breasts on 04/26/2018 revealing a 1.5 x 0.8 x 0.8 cm mass in the upper inner quadrant with in situ biopsy clip, and anterior and slightly lateral to the known malignancy revealed a 5 mm mass, there was an adjacent 7 mm mass as well 3.8 cm posteriorly and slightly lateral.  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  Her ER and PR were positive, and Ki-67 was 5%, HER-2 was again negative.  She comes today to discuss options of treatment for her cancer.     PREVIOUS RADIATION THERAPY: No   PAST MEDICAL HISTORY:  Past Medical History:  Diagnosis Date  . Anxiety   . Arthritis   . Cancer ([place]) 03/2018   right breast cancer  .  Cataract   . Chronic kidney disease   . Depression   . GERD (gastroesophageal reflux disease)   . 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 Osteoporosis   . Sciatica   . UTI (urinary tract infection) 8/15  . Wears glasses        PAST SURGICAL HISTORY: Past Surgical History:  Procedure Laterality Date  . CESAREAN SECTION    . COLONOSCOPY    . CYST REMOVAL NECK Left 03/03/2014   Procedure: LEFT NECK CYST EXCISION;  Surgeon: Pedro Earls, MD;  Location: [facility];  Service: General;  Laterality: Left;  . DILATION AND CURETTAGE OF UTERUS    . EYE SURGERY    . STRABISMUS SURGERY Bilateral 05/02/2018   Procedure: BILATERAL REPAIR STRABISMUS;  Surgeon: Lamonte Sakai, MD;  Location: [facility];  Service: Ophthalmology;  Laterality: Bilateral;  . TONSILLECTOMY AND ADENOIDECTOMY    . UPPER GI ENDOSCOPY       FAMILY HISTORY:  Family History  Problem Relation Age of Onset  . Cancer Mother        breast  . Breast cancer Mother   . Hypertension Father   . Mental illness Sister   . Cancer Maternal Grandfather      SOCIAL HISTORY:  reports that she quit smoking about 42 years ago. Her smoking use included cigarettes. She has never used smokeless tobacco. She reports  that she drinks alcohol. She reports that she does not use drugs.  The patient is married and lives in [place]. She is retired but still works part time. She enjoys yoga and playing piano.   ALLERGIES: Bactrim [sulfamethoxazole-trimethoprim]   MEDICATIONS:  Current Outpatient Medications  Medication Sig Dispense Refill  . Brimonidine Tartrate (LUMIFY) 0.025 % SOLN Apply to eye.    . Calcium-Magnesium 500-250 MG TABS Take by mouth.    . Cholecalciferol (VITAMIN D3 PO) Take 1 tablet by mouth 2 (two) times daily.     . cyanocobalamin 1000 MCG tablet Take 5,000 mcg by mouth daily.    Marland Kitchen desvenlafaxine (PRISTIQ) 50 MG 24 hr tablet Take 1 tablet by mouth daily.  11  . Diethylpropion HCl 25 MG TABS  Take by mouth.    . Digestive Enzymes (DIGESTIVE ENZYME PO) Take by mouth.    . Docosanol (ABREVA) 10 % CREA Apply topically.    . Hypochlorous Acid 0.012 % SOLN Apply topically.    . Hypromellose (ARTIFICIAL TEARS OP) Apply to eye.    . linaclotide (LINZESS) 290 MCG CAPS capsule Take 290 mcg by mouth daily before breakfast.    . LORazepam (ATIVAN) 1 MG tablet TK 1/2 T PO Q 8 HOURS PRF ANXIETY  0  . Magnesium Gluconate (MAG-G) 500 (27 Mg) MG TABS Take by mouth.    . meloxicam (MOBIC) 15 MG tablet Take 15 mg by mouth daily.    . Multiple Vitamins-Minerals (PRESERVISION AREDS) CAPS Take 1 capsule by mouth daily.    . Omega-3 Fatty Acids (OMEGA 3 PO) Take by mouth.    Marland Kitchen Specialty Vitamins Products (ONE-A-DAY BONE STRENGTH PO) Take by mouth.    . tamoxifen (NOLVADEX) 20 MG tablet Take 1 tablet (20 mg total) by mouth daily. 90 tablet 3  . tobramycin-dexamethasone (TOBRADEX) ophthalmic ointment Place 1 application into both eyes 3 (three) times daily. For one week    . Turmeric 500 MG CAPS Take by mouth.     No current facility-administered medications for this encounter.      REVIEW OF SYSTEMS: On review of systems, the patient reports that she is doing well overall. She reports she has been upset by the diagnosis and the treatment she faces. She denies any chest pain, shortness of breath, cough, fevers, chills, night sweats, unintended weight changes. She denies any bowel or bladder disturbances, and denies abdominal pain, nausea or vomiting. She denies any new musculoskeletal or joint aches or pains. A complete review of systems is obtained and is otherwise negative.     PHYSICAL EXAM:  Wt Readings from Last 3 Encounters:  05/08/18 130 lb 14.4 oz (59.4 kg)  05/02/18 128 lb 8.5 oz (58.3 kg)  10/22/17 124 lb 3.2 oz (56.3 kg)   Temp Readings from Last 3 Encounters:  05/08/18 97.7 F (36.5 C) (Oral)  05/02/18 97.9 F (36.6 C)  09/02/15 97.8 F (36.6 C) (Oral)   BP Readings from Last 3  Encounters:  05/08/18 (!) 155/75  05/02/18 (!) 150/82  10/22/17 134/88   Pulse Readings from Last 3 Encounters:  05/08/18 82  05/02/18 81  10/22/17 82     In general this is a well appearing caucasian female in no acute distress. She is alert and oriented x4 and appropriate throughout the examination. HEENT reveals that the patient is normocephalic, atraumatic. Cardiopulmonary assessment is negative for acute distress and she exhibits normal effort.   ECOG = 1  0 - Asymptomatic (Fully active, able to  carry on all predisease activities without restriction)  1 - Symptomatic but completely ambulatory (Restricted in physically strenuous activity but ambulatory and able to carry out work of a light or sedentary nature. For example, light housework, office work)  2 - Symptomatic, <50% in bed during the day (Ambulatory and capable of all self care but unable to carry out any work activities. Up and about more than 50% of waking hours)  3 - Symptomatic, >50% in bed, but not bedbound (Capable of only limited self-care, confined to bed or chair 50% or more of waking hours)  4 - Bedbound (Completely disabled. Cannot carry on any self-care. Totally confined to bed or chair)  5 - Death   Eustace Pen MM, Creech RH, Tormey DC, et al. [phone]). "Toxicity and response criteria of the [facility]". [place] Oncol. 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 LABS:  Creatinine was obtained on site at [facility] at [address] Results: Creatinine 1.1 mg/dL. EXAM: BILATERAL BREAST MRI WITH AND WITHOUT CONTRAST TECHNIQUE: Multiplanar, multisequence MR images of both breasts were obtained prior to and following the intravenous administration of 11 ml of MultiHance. Three-dimensional MR images were rendered by post-processing the original MR data using the DynaCAD thin client. The 3D MR images are interpreted and the findings are included in the complete MRI report below. COMPARISON:  Previous exam(s). FINDINGS: Breast composition: c. Heterogeneous fibroglandular tissue. Background parenchymal enhancement: Moderate. Right breast: There is an irregular enhancing mass in the posterior third of the upper-inner quadrant of the right breast measuring 1.5 x 0.8 x 0.8 cm. There is a signal void artifact in the mass from the biopsy clip. 1.6 cm anterior and slightly lateral to the known malignancy is an enhancing 5 mm mass. 3.8 cm anterior and slightly lateral to the known malignancy is an enhancing 7 mm mass (both masses can be seen on series 6 image 80/144). There are several smaller enhancing masses in the right breast. Left breast: No mass or abnormal enhancement. Lymph nodes: No abnormal appearing lymph nodes. Ancillary findings:  None. IMPRESSION: 1.5 cm biopsy proven invasive lobular carcinoma in the upper-inner quadrant of right breast. Multiple additional suspicious enhancing masses in the right breast. RECOMMENDATION: MR guided core biopsy of the 2 masses anterior and lateral to the known malignancy seen on series 6 image 87/144 is recommended. BI-RADS CATEGORY  4: Suspicious. Electronically Signed   By: Lillia Mountain M.D.   On: 04/28/2018 14:46   Mm Clip Placement Right  Result Date: 05/05/2018 CLINICAL DATA:  Status post MR guided core biopsy of masses in the  UPPER-OUTER QUADRANT of the RIGHT breast. EXAM: DIAGNOSTIC RIGHT MAMMOGRAM POST MRI BIOPSY COMPARISON:  04/26/2018 and earlier FINDINGS: Mammographic images  were obtained following MRI guided biopsy of mass in the anterior UPPER OUTER QUADRANT of the RIGHT breast and placement of a bow tie shaped clip. Clip is in expected location, 5.3 centimeters from the known malignancy marked with a ribbon shaped clip in the posterior UPPER portion of the RIGHT breast. Following biopsy of a mass in the UPPER-OUTER QUADRANT posterior portion of the RIGHT breast, a cylinder-shaped clip was placed. Clip is in the expected location, 2.1 centimeters anterior to the known malignancy in the posterior superior portion of the RIGHT breast. IMPRESSION: Tissue marker clips are in the expected locations after biopsy. Final Assessment: Post Procedure Mammograms for Marker Placement Electronically Signed   By: Nolon Nations M.D.   On: 05/05/2018 10:15   Mr Rt Breast Bx Johnella Moloney Dev 1st Lesion Image Bx Spec Mr Guide  Addendum Date: 05/06/2018   ADDENDUM REPORT: 05/06/2018 15:28 ADDENDUM: Pathology revealed ATYPICAL LOBULAR HYPERPLASIA of the Right breast, anterior, upper outer quadrant, (dumbbell clip). This was found to be concordant by Dr. Nolon Nations, with excision recommended. Pathology revealed GRADE I INVASIVE MAMMARY CARCINOMA, MAMMARY CARCINOMA IN-SITU of the Right breast, posterior upper outer quadrant, (cylinder clip). This was found to be concordant by Dr. Nolon Nations. Pathology results were discussed with the patient by telephone. The patient reported doing well after the biopsies with tenderness at the sites. Post biopsy instructions and care were reviewed and questions were answered. The patient was encouraged to call [facility] for any additional concerns. The patient has a recent diagnosis of right breast cancer and should follow her outlined treatment plan. Dr. Donnie Mesa was  notified of biopsy results via EPIC message on May 06, 2018. Pathology results reported by Terie Purser, RN on 05/06/2018. Electronically Signed   By: Nolon Nations M.D.   On: 05/06/2018 15:28   Result Date: 05/06/2018 CLINICAL DATA:  Patient has been recently diagnosed with grade 1 invasive lobular carcinoma and lobular carcinoma in situ in the RIGHT breast 1 o'clock location. MRI demonstrates 2 additional enhancing masses in the RIGHT breast and patient presents for biopsy of these masses today. EXAM: MRI GUIDED CORE NEEDLE BIOPSY OF THE RIGHT BREAST x2 TECHNIQUE: Multiplanar, multisequence MR imaging of the RIGHT breast was performed both before and after administration of intravenous contrast. CONTRAST:  37m MULTIHANCE GADOBENATE DIMEGLUMINE 529 MG/ML IV SOLN COMPARISON:  Previous exams. FINDINGS: I met with the patient, and we discussed the procedure of MRI guided biopsy, including risks, benefits, and alternatives. Specifically, we discussed the risks of infection, bleeding, tissue injury, clip migration, and inadequate sampling. Informed, written consent was given. The usual time out protocol was performed immediately prior to the procedure. Using sterile technique, 1% Lidocaine, MRI guidance, and a 9 gauge vacuum assisted device, biopsy was performed of anterior mass in the UPPER-OUTER QUADRANT of the RIGHT breast using a LATERAL approach. At the conclusion of the procedure, a bow tie shaped tissue marker clip was deployed into the biopsy cavity. Using sterile technique, 1% Lidocaine, MRI guidance, and a 9 gauge vacuum assisted device, biopsy was performed of posterior mass in the UPPER-OUTER QUADRANT of the RIGHT breast using a LATERAL approach. At the conclusion of the procedure, a cylinder shaped tissue marker clip was deployed into the biopsy cavity. Follow-up 2-view mammogram was performed and dictated separately. IMPRESSION: MRI guided biopsy of 2 masses in the UPPER-OUTER QUADRANT of the  RIGHT breast. No apparent complications. Electronically Signed: By: ENolon NationsM.D. On: 05/05/2018  10:07   Mr Rt Breast Bx W Loc Dev Ea Add Lesion Image Bx Spec Mr Guide  Addendum Date: 05/06/2018   ADDENDUM REPORT: 05/06/2018 15:28 ADDENDUM: Pathology revealed ATYPICAL LOBULAR HYPERPLASIA of the Right breast, anterior, upper outer quadrant, (dumbbell clip). This was found to be concordant by Dr. Nolon Nations, with excision recommended. Pathology revealed GRADE I INVASIVE MAMMARY CARCINOMA, MAMMARY CARCINOMA IN-SITU of the Right breast, posterior upper outer quadrant, (cylinder clip). This was found to be concordant by Dr. Nolon Nations. Pathology results were discussed with the patient by telephone. The patient reported doing well after the biopsies with tenderness at the sites. Post biopsy instructions and care were reviewed and questions were answered. The patient was encouraged to call [facility] for any additional concerns. The patient has a recent diagnosis of right breast cancer and should follow her outlined treatment plan. Dr. Donnie Mesa was notified of biopsy results via EPIC message on May 06, 2018. Pathology results reported by Terie Purser, RN on 05/06/2018. Electronically Signed   By: Nolon Nations M.D.   On: 05/06/2018 15:28   Result Date: 05/06/2018 CLINICAL DATA:  Patient has been recently diagnosed with grade 1 invasive lobular carcinoma and lobular carcinoma in situ in the RIGHT breast 1 o'clock location. MRI demonstrates 2 additional enhancing masses in the RIGHT breast and patient presents for biopsy of these masses today. EXAM: MRI GUIDED CORE NEEDLE BIOPSY OF THE RIGHT BREAST x2 TECHNIQUE: Multiplanar, multisequence MR imaging of the RIGHT breast was performed both before and after administration of intravenous contrast. CONTRAST:  27m MULTIHANCE GADOBENATE DIMEGLUMINE 529 MG/ML IV SOLN COMPARISON:  Previous exams. FINDINGS: I met with  the patient, and we discussed the procedure of MRI guided biopsy, including risks, benefits, and alternatives. Specifically, we discussed the risks of infection, bleeding, tissue injury, clip migration, and inadequate sampling. Informed, written consent was given. The usual time out protocol was performed immediately prior to the procedure. Using sterile technique, 1% Lidocaine, MRI guidance, and a 9 gauge vacuum assisted device, biopsy was performed of anterior mass in the UPPER-OUTER QUADRANT of the RIGHT breast using a LATERAL approach. At the conclusion of the procedure, a bow tie shaped tissue marker clip was deployed into the biopsy cavity. Using sterile technique, 1% Lidocaine, MRI guidance, and a 9 gauge vacuum assisted device, biopsy was performed of posterior mass in the UPPER-OUTER QUADRANT of the RIGHT breast using a LATERAL approach. At the conclusion of the procedure, a cylinder shaped tissue marker clip was deployed into the biopsy cavity. Follow-up 2-view mammogram was performed and dictated separately. IMPRESSION: MRI guided biopsy of 2 masses in the UPPER-OUTER QUADRANT of the RIGHT breast. No apparent complications. Electronically Signed: By: ENolon NationsM.D. On: 05/05/2018 10:07       IMPRESSION/PLAN: 1. Stage IA, [id number] grade 1 ER/PR positive invasive lobular carcinoma of the right breast. Dr. MLisbeth Renshawdiscusses the pathology findings and reviews the nature of invasive breast disease.  The patient has been counseled on surgical intervention and is planning for mastectomy with sentinel node biopsy.  Her tumor will be tested for Oncotype Dx score to determine a role for systemic therapy. Provided that chemotherapy is not indicated, her course would be  followed by antiestrogen therapy.  We did discuss considerations for postmastectomy radiotherapy which was the purpose of her visit today.  While we do not anticipate radiation being a part of her treatment plan as of today, we will  follow-up with the  results of her pathology to determine this definitively.  We discussed the risks, benefits, short, and long term effects of radiotherapy, and the patient is interested in proceeding if indicated. Dr. Lisbeth Renshaw discusses the delivery and logistics of radiotherapy and anticipates a course of 4 or 6-1/2 weeks of radiotherapy again pathology dependent. We will see her back to be determined following her surgery.  In a visit lasting 60 minutes, greater than 50% of the time was spent face to face discussing her case, and coordinating the patient's care.   The above documentation reflects my direct findings during this shared patient visit. Please see the separate note by Dr. Lisbeth Renshaw on this date for the remainder of the patient's plan of care.    Carola Rhine, PAC

## 2018-05-13 ENCOUNTER — Ambulatory Visit
Admission: RE | Admit: 2018-05-13 | Discharge: 2018-05-13 | Disposition: A | Discharge status: Home / Self Care | Payer: Medicare Other | Source: Ambulatory Visit | Attending: Radiation Oncology | Admitting: Radiation Oncology

## 2018-05-13 ENCOUNTER — Other Ambulatory Visit: Payer: Self-pay

## 2018-05-13 ENCOUNTER — Encounter: Payer: Self-pay | Admitting: Radiation Oncology

## 2018-05-13 ENCOUNTER — Encounter: Payer: Self-pay | Admitting: General Practice

## 2018-05-13 ENCOUNTER — Inpatient Hospital Stay: Payer: Medicare Other

## 2018-05-13 ENCOUNTER — Ambulatory Visit: Payer: Medicare Other

## 2018-05-13 ENCOUNTER — Ambulatory Visit (HOSPITAL_BASED_OUTPATIENT_CLINIC_OR_DEPARTMENT_OTHER): Payer: Medicare Other | Admitting: Genetics

## 2018-05-13 VITALS — BP 138/79 | HR 78 | Temp 98.2°F | Resp 18 | Ht 63.5 in | Wt 130.1 lb

## 2018-05-13 DIAGNOSIS — Z87891 Personal history of nicotine dependence: Secondary | ICD-10-CM

## 2018-05-13 DIAGNOSIS — N189 Chronic kidney disease, unspecified: Secondary | ICD-10-CM | POA: Insufficient documentation

## 2018-05-13 DIAGNOSIS — Z803 Family history of malignant neoplasm of breast: Secondary | ICD-10-CM | POA: Diagnosis not present

## 2018-05-13 DIAGNOSIS — C50411 Malignant neoplasm of upper-outer quadrant of right female breast: Secondary | ICD-10-CM | POA: Diagnosis not present

## 2018-05-13 DIAGNOSIS — Z882 Allergy status to sulfonamides status: Secondary | ICD-10-CM | POA: Diagnosis not present

## 2018-05-13 DIAGNOSIS — Z170 Estrogen receptor positive status [ER+]: Secondary | ICD-10-CM

## 2018-05-13 NOTE — Progress Notes (Signed)
[facility] Spiritual Care Note  Personally well acquainted with Makilah and her family. Provided pastoral and logistical support today when pt presented to [facility] for genetic counseling. [facility] referral per pt request. Also suggested counseling intern Doris Cheadle per pt request. [place] programming for community-building, meaning-making, stress reduction, and general coping.  [place] and family know to contact Team whenever needed/desired.   [place], [place], [facility] Pager [phone] Voicemail [phone]

## 2018-05-13 NOTE — Progress Notes (Signed)
Nutrition Assessment   Reason for Assessment:   Patient reports recent weight gain. Wants to know good nutrition for breast cancer.     ASSESSMENT:   67 year old female with right breast cancer.  Planning mastectomy vs bilateral mastectomies with reconstruction.  Also planning oncotype DX.  Patient has been started on tamoxifen as of 10/24.  Past medical history of anxiety, CKD, depression, GERD, osteoporosis.  Met with patient and husband in clinic today as add on.  She originally had nutrition appointment on 11/11 with Ernestene Kiel but thought appointment was today.  Patient reports that she has gained a lot of weight recently and wants to know what foods to eat to help her get back down to her UBW of 115.  Tearful at times during visit regarding having to have a mastectomy possible bilateral and weight gain.  Typical diet includes fruits and vegetables, whole grains, lean protein.  Cooks with olive oil mostly.  Snacks on peanut butter, nuts and seeds.  Over the summer feels that she ate more "junk" food of tortilla chips, cheese its, ice cream chocolate.  Overall reports good, normal appetite.   Reports regular exercise including yoga  Nutrition Focused Physical Exam: deferred   Medications: reviewed   Labs: reviewed   Anthropometrics:   Height: 63.5 inches Weight: 130 lb 2 oz UBW: 115 lb per patient.  Noted per chart 118 lb 09/02/2015 BMI: 22  12 lb weight gain over the last 2 years and 9 months.     NUTRITION DIAGNOSIS:     INTERVENTION:  Discussed current recommendations regarding consuming plant based diet.  Explained importance of maintaining good nutrition during treatment process and encouraged patient to focus more on healthy foods during treatment process vs weight gain.  Fact sheets given to patient along with resources for patient to use.   Encouraged patient to continue exercise routine.   Encouraged patient to keep food diary.   Also provided patient with  resources to help her meet her goals and maintain her nutrition.   Contact information provided to patient.      MONITORING, EVALUATION, GOAL: Patent will consume plant based diet to maintain nutrition   Next Visit: as needed.  Patient to contact RD as needed.  No follow-up planned  Sho Salguero B. Zenia Resides, [place], [place] Registered Dietitian [phone] (pager)

## 2018-05-14 ENCOUNTER — Encounter: Payer: Self-pay | Admitting: Genetics

## 2018-05-14 DIAGNOSIS — Z803 Family history of malignant neoplasm of breast: Secondary | ICD-10-CM | POA: Insufficient documentation

## 2018-05-14 NOTE — Progress Notes (Signed)
REFERRING PROVIDER: Nicholas Lose, MD [place], [place] [id number]  PRIMARY PROVIDER:  Jonathon Jordan, MD  PRIMARY REASON FOR VISIT:  1. Malignant neoplasm of upper-outer quadrant of right breast in female, estrogen receptor positive ([place])   2. Family history of breast cancer     HISTORY OF PRESENT ILLNESS:   [name], a 67 y.o. female, was seen for a [facility] [place] cancer genetics consultation at the request of Dr. Lindi Adie due to a personal and family history of breast cancer.  Ms. Scheier presents to clinic today to discuss the possibility of a hereditary predisposition to cancer, genetic testing, and to further clarify her future cancer risks, as well as potential cancer risks for family members.   In 2019, at the age of 67, Ms. Vasallo was diagnosed with Invasive Lobular carcinoma of the right breast. She is currently making surgical decisions at this time and expressed that the results of this testing may impact her surgical decision.  She will have adjuvant radiation if needed and anti-estrogen therapy.   CANCER HISTORY:    Malignant neoplasm of upper-outer quadrant of right breast in female, estrogen receptor positive ([place])   05/05/2018 Initial Diagnosis    Screening detected right breast mass, MRI revealed UIQ 1.5 x 0.8 x 0.8 cm mass.  Additional masses 5 mm size and 7 mm size and several smaller enhancing masses in the right breast, biopsy of the 1.5 cm mass invasive lobular cancer with LCIS ER 20%, PR 20%, Ki-67 less than 1%, HER-2 negative; biopsy of additional masses showed ALH and ILC with LCIS ER 100%, PR 70%, Ki-67 5%, HER-2 1+ negative     HORMONAL RISK FACTORS:  First live birth at age 71.  Ovaries intact: yes.  Hysterectomy: no.  Menopausal status: postmenopausal.  Colonoscopy: yes; has had a few hyperplastic polys. Mammogram within the last year: yes.   Past Medical History:  Diagnosis Date  . Anxiety   . Arthritis   . Cancer ([place])  03/2018   right breast cancer  . Cataract   . Chronic kidney disease   . Depression   . Family history of breast cancer   . GERD (gastroesophageal reflux disease)   . 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 Osteoporosis   . UTI (urinary tract infection) 8/15  . Wears glasses     Past Surgical History:  Procedure Laterality Date  . CESAREAN SECTION    . COLONOSCOPY    . CYST REMOVAL NECK Left 03/03/2014   Procedure: LEFT NECK CYST EXCISION;  Surgeon: Pedro Earls, MD;  Location: [facility];  Service: General;  Laterality: Left;  . DILATION AND CURETTAGE OF UTERUS    . EYE SURGERY    . STRABISMUS SURGERY Bilateral 05/02/2018   Procedure: BILATERAL REPAIR STRABISMUS;  Surgeon: Lamonte Sakai, MD;  Location: [facility];  Service: Ophthalmology;  Laterality: Bilateral;  . TONSILLECTOMY AND ADENOIDECTOMY    . UPPER GI ENDOSCOPY      Social History   Socioeconomic History  . Marital status: Married    Spouse name: Not on file  . Number of children: Not on file  . Years of education: Not on file  . Highest education level: Not on file  Occupational History  . Not on file  Social Needs  . Financial resource strain: Not on file  . Food insecurity:    Worry: Not on file    Inability: Not on file  . Transportation  needs:    Medical: No    Non-medical: No  Tobacco Use  . Smoking status: Former Smoker    Types: Cigarettes    Last attempt to quit: 12/23/1975    Years since quitting: 42.4  . Smokeless tobacco: Never Used  Substance and Sexual Activity  . Alcohol use: Yes    Comment: social  . Drug use: No  . Sexual activity: Not Currently    Birth control/protection: Post-menopausal  Lifestyle  . Physical activity:    Days per week: Not on file    Minutes per session: Not on file  . Stress: Not on file  Relationships  . Social connections:    Talks on phone: Not on file    Gets together: Not on file    Attends religious service: Not on file     Active member of club or organization: Not on file    Attends meetings of clubs or organizations: Not on file    Relationship status: Not on file  Other Topics Concern  . Not on file  Social History Narrative  . Not on file     FAMILY HISTORY:  We obtained a detailed, 4-generation family history.  Significant diagnoses are listed below: Family History  Problem Relation Age of Onset  . Breast cancer Mother 62  . Hypertension Father   . Mental illness Sister   . Cancer Maternal Grandfather        blood cancer?  . Breast cancer Cousin        dx under 50  . Cancer Paternal Aunt        type unk  . Cancer Other        type unk    Ms. Frei has a daughter, leah, who is 61 with no history of cancer.  She has Turner Syndrome. Ms. Detter also has a son who sis 7 with no history of cancer. He has a son and a daughter.   Ms. Cumberland father: 77, no hx of cancer.  Paternal aunts/Uncles: 5 paternal aunts, 5 paternal uncles.  1 paternal aunt had cancer, type unk, no other history of cancer in these relatives.  1 additional aunt/uncle died at birth Paternal cousins: no history of cancer known Paternal grandfather: died older than 66 no hx of cancer Paternal grandmother:died older than 37, no hx of cancer.  Ms. Scroggin mother: dx with breast cancer at 78, died at 23. Maternal Aunts/Uncles: 4 paternal aunts, no hx of cancer.  Maternal cousins: 1 maternal cousin who was dx with breast cancer under the age of 51. Maternal grandfather: hx of cancer- thinks it was a blood cancer? He had a sibling who had cancer- type unk Maternal grandmother:died in 'old age' with no hx of cancer  Ms. Leidner is unaware of previous family history of genetic testing for hereditary cancer risks. Patient's maternal ancestors are of [place] descent, and paternal ancestors are of English/N. European descent. There is no reported Ashkenazi Jewish ancestry. There is no known consanguinity.  GENETIC COUNSELING  ASSESSMENT: Lisa Yoder is a 67 y.o. female with a personal and family hisotry which is somewhat suggestive of a Hereditary Cancer Predisposition Syndrome. We, therefore, discussed and recommended the following at today's visit.   DISCUSSION: We reviewed the characteristics, features and inheritance patterns of hereditary cancer syndromes. We also discussed genetic testing, including the appropriate family members to test, the process of testing, insurance coverage and turn-around-time for results. We discussed the implications of a negative, positive and/or  variant of uncertain significant result. In order to get genetic test results in a timely manner so that Ms. Rohwer can use these genetic test results for surgical decisions, we recommended Ms. Jasmer pursue genetic testing for the Breast Cancer STAT panel.  The STAT Breast cancer panel offered by [facility] includes sequencing and rearrangement analysis for the following 9 genes:  ATM, BRCA1, BRCA2, CDH1, CHEK2, PALB2, PTEN, STK11 and TP53.     We then recommend Ms. Whetsel pursue reflex genetic testing to the Multi-Cancer gene panel.  The Multi-Cancer Panel offered by [facility] includes sequencing and/or deletion duplication testing of the following 91 genes: AIP, ALK, APC, ATM, AXIN2, BAP1, BARD1, BLM, BMPR1A, BRCA1, BRCA2, BRIP1, BUB1B, CASR, CDC73, CDH1, CDK4, CDKN1B, CDKN1C, CDKN2A, CEBPA, CEP57, CHEK2, CTNNA1, DICER1, DIS3L2, EGFR, ENG, EPCAM, FH, FLCN, GALNT12, GATA2, GPC3, GREM1, HOXB13, HRAS, KIT, MAX, MEN1, MET, MITF, MLH1, MLH3, MSH2, MSH3, MSH6, MUTYH, NBN, NF1, NF2, NTHL1, PALB2, PDGFRA, PHOX2B, PMS2, POLD1, POLE, POT1, PRKAR1A, PTCH1, PTEN, RAD50, RAD51C, RAD51D, RB1, RECQL4, RET, RNF43, RPS20, RUNX1, SDHA, SDHAF2, SDHB, SDHC, SDHD, SMAD4, SMARCA4, SMARCB1, SMARCE1, STK11, SUFU, TERC, TERT, TMEM127, TP53, TSC1, TSC2, VHL, WRN, WT1   We discussed that only 5-10% of cancers are associated with a Hereditary cancer predisposition syndrome.   One of the most common hereditary cancer syndromes that increases breast cancer risk is called Hereditary Breast and Ovarian Cancer (HBOC) syndrome.  This syndrome is caused by mutations in the BRCA1 and BRCA2 genes.  This syndrome increases an individual's lifetime risk to develop breast, ovarian, pancreatic, and other types of cancer.  There are also many other cancer predisposition syndromes caused by mutations in several other genes.  We discussed that if she is found to have a mutation in one of these genes, it may impact surgical decisions, and alter future medical management recommendations such as increased cancer screenings and consideration of risk reducing surgeries.  A positive result could also have implications for the patient's family members.  A Negative result would mean we were unable to identify a hereditary component to her cancer, but does not rule out the possibility of a hereditary basis for her cancer.  There could be mutations that are undetectable by current technology, or in genes not yet tested or identified to increase cancer risk.    We discussed the potential to find a Variant of Uncertain Significance or VUS.  These are variants that have not yet been identified as pathogenic or benign, and it is unknown if this variant is associated with increased cancer risk or if this is a normal finding.  Most VUS's are reclassified to benign or likely benign.   It should not be used to make medical management decisions. With time, we suspect the lab will determine the significance of any VUS's identified if any.   Based on Ms. Macdonnell's personal and family history of cancer, she meets medical criteria for genetic testing. Despite that she meets criteria, she may still have an out of pocket cost. The laboratory can provide her with an estimate of her OOP cost. she was given the contact information of the laboratory if she has further questions.   PLAN: After considering the risks,  benefits, and limitations, Ms. Vendetti  provided informed consent to pursue genetic testing and the blood sample was sent to [facility] for analysis of the Breast Cancer STAT panel with plans to reflex to the Multi-Cancer Panel.  Preliminary results should be available within approximately 5-12 days' time, at which point  they will be disclosed by telephone to Ms. Byrom, as will any additional recommendations warranted by these results. Ms. Cahn will receive a summary of her genetic counseling visit and a copy of her results once available. This information will also be available in Epic. We encouraged Ms. Tippetts to remain in contact with cancer genetics annually so that we can continuously update the family history and inform her of any changes in cancer genetics and testing that may be of benefit for her family. Ms. Fullen questions were answered to her satisfaction today. Our contact information was provided should additional questions or concerns arise.  Based on Ms. Boman's family history, we recommended her materna cousin who had breast cancer under 70 also have genetic counseling and testing. Ms. Fichter will let us know if we can be of any assistance in coordinating genetic counseling and/or testing for this family member.   Lastly, we encouraged Ms. Agyeman to remain in contact with cancer genetics annually so that we can continuously update the family history and inform her of any changes in cancer genetics and testing that may be of benefit for this family.   Ms.  Roemer questions were answered to her satisfaction today. Our contact information was provided should additional questions or concerns arise. Thank you for the referral and allowing [place] to share in the care of your patient.   Tana Felts, MS, [facility] Certified Genetic Counselor [email or web address] phone: [phone]  The patient was seen for a total of 35 minutes in face-to-face genetic counseling.   The patient was accompanied today by her husband (in the waiting room). This patient was discussed with Drs. Magrinat, Lindi Adie and/or Burr Medico who agrees with the above.

## 2018-05-15 ENCOUNTER — Ambulatory Visit: Payer: Self-pay | Admitting: Surgery

## 2018-05-15 DIAGNOSIS — C50911 Malignant neoplasm of unspecified site of right female breast: Secondary | ICD-10-CM

## 2018-05-15 NOTE — H&P (Signed)
PCP - Jonathon Jordan, MD Referred by Everlean Alstrom for right breast invasive lobular carcinoma  This is a 67 year old female in good health who presents after recent screening mammogram showing a possible right breast mass. She reports no problems prior to the mammogram. No palpable masses, no lymphadenopathy, no breast pain, no nipple changes or discharge. Work-up resulted in a core biopsy at 1:00 4 cmfn showing invasive lobular with LCIS, ER/ PR +, Her 2 negative. She has very dense breasts. On ultrasound, a second lesion was at 1:00 8 cmfn, but this was not found at the time of biopsy.  Subsequent MRI showed other suspicious findings in the right breast.  Two of these were biopsied under MR guidance.  One showed invasive carcinoma and the other showed atypical lobular hyperplasia.  After further discussion, she is opting for mastectomy.  She met with Dr. Iran Planas to discuss options for reconstruction, but has made the decision to proceed with mastectomy without reconstruction.  Menarche - age 59 First pregnancy - age 27 Breastfeed yes Hormones - OCP 10 years Menopause 55 Family history - mother with Paget's disease, first cousin with breast cancer   Path - Invasive lobular carcinoma Grade I - right breast at 1:00, LCIS; ER/ PR positive, Her2 negative, Ki67 <1%  CLINICAL DATA: 67 year old female recalled from screening mammogram dated 03/24/2018 for a possible right breast mass.  EXAM: DIGITAL DIAGNOSTIC RIGHT MAMMOGRAM WITH CAD AND TOMO  ULTRASOUND RIGHT BREAST  COMPARISON: Previous exam(s).  ACR Breast Density Category c: The breast tissue is heterogeneously dense, which may obscure small masses.  FINDINGS: A persistent mass in the upper inner quadrant of the right breast demonstrates spiculated margins. Further evaluation of this area with ultrasound was performed.  Mammographic images were processed with CAD.  Targeted ultrasound is performed, showing an  irregular, hypoechoic mass with posterior acoustic shadowing and associated vascularity at the 1 o'clock position 4 cm from the nipple. It measures 8 x 8 x 6 mm. This correlates well with the mammographic finding.  Upon further evaluation, an irregular hyperechoic mass with associated posterior acoustic shadowing is demonstrated at the 1 o'clock position 8 cm from the nipple. There is suggestion of associated architectural distortion sonographically. It measures 7 x 6 x 5 mm.  Evaluation of the right axilla demonstrates no suspicious lymphadenopathy.  IMPRESSION: 1. Suspicious right breast mass at the 1 o'clock position 4 cm from the nipple corresponding with the screening mammographic findings. Recommendation is for ultrasound-guided biopsy. 2. Indeterminate right breast mass at the 1 o'clock position 8 cm from the nipple. Recommendation is for ultrasound-guided biopsy. 3. No suspicious right axillary lymphadenopathy.  RECOMMENDATION: 1. Two area ultrasound-guided biopsy of the right breast. 2. Pending biopsy results, bilateral breast MRI is recommended given the patient's breast density.  I have discussed the findings and recommendations with the patient. Results were also provided in writing at the conclusion of the visit. If applicable, a reminder letter will be sent to the patient regarding the next appointment.  BI-RADS CATEGORY 4: Suspicious.   Electronically Signed By: Kristopher Oppenheim M.D. On: 04/01/2018 14:40  CLINICAL DATA: 67 year old female with a suspicious mass in the right breast at 1 o'clock 4 cm from nipple felt to correspond with suspicious distortion seen in the right breast mammographically.  EXAM: ULTRASOUND GUIDED RIGHT BREAST CORE NEEDLE BIOPSY  COMPARISON: Previous exam(s).  FINDINGS: The pre biopsy ultrasound demonstrated the mass in the upper inner right breast at 1 o'clock 4 cm from the  nipple. This is felt to correspond with the  distortion seen in the right breast on mammography. The additional subtle mass seen in the right breast at 1 o'clock 8 cm from the nipple could not be reproduced on today's ultrasound despite extensive scanning with only areas of dense fibroglandular tissue seen in this location.  I met with the patient and we discussed the procedure of ultrasound-guided biopsy, including benefits and alternatives. We discussed the high likelihood of a successful procedure. We discussed the risks of the procedure, including infection, bleeding, tissue injury, clip migration, and inadequate sampling. Informed written consent was given. The usual time-out protocol was performed immediately prior to the procedure.  Lesion quadrant: Upper inner  Using sterile technique and 1% Lidocaine as local anesthetic, under direct ultrasound visualization, a 12 gauge spring-loaded device was used to perform biopsy of the hypoechoic mass in the right breast at 1 o'clock 4 cm from nipple using a medial to lateral approach. At the conclusion of the procedure a ribbon shaped tissue marker clip was deployed into the biopsy cavity. Follow up 2 view mammogram was performed and dictated separately.  IMPRESSION: 1. Ultrasound-guided biopsy of the mass in the right breast at the 1 o'clock position 4 cm from nipple.  2. The additional previously seen mass in the right breast at 1 o'clock 8 cm from nipple could not be reproduced on today's repeat ultrasound with only areas of dense fibroglandular tissue identified in this location.  3. Further evaluation with breast MRI is suggested to ensure no persistent additional suspicious masses are identified in the right breast, especially given dense fibroglandular tissue.  Electronically Signed: By: Everlean Alstrom M.D. On: 04/11/2018 14:41  CLINICAL DATA: Post ultrasound-guided biopsy of a suspicious mass/distortion in the upper inner right breast.  EXAM: DIAGNOSTIC  RIGHT MAMMOGRAM POST ULTRASOUND BIOPSY  COMPARISON: Previous exam(s).  FINDINGS: Mammographic images were obtained following ultrasound guided biopsy of a suspicious mass/distortion in the upper inner right breast at the 1 o'clock position. A ribbon shaped biopsy marking clip is present at the site of the biopsied mass in the upper inner right breast.  IMPRESSION: Ribbon shaped biopsy marking clip at site of biopsied mass/distortion in the upper inner right breast.  Final Assessment: Post Procedure Mammograms for Marker Placement   Electronically Signed By: Everlean Alstrom M.D. On: 04/11/2018 14:50     Allergies  Bactrim *ANTI-INFECTIVE AGENTS - MISC.*  Anaphylaxis. Allergies Reconciled   Medication History  Pristiq (50MG Tablet ER 24HR, Oral) Active. Anusol-HC (25MG Suppository, 1 (one) Rectal two times daily, Taken starting 08/13/2017) Active. LORazepam (0.5MG Tablet, Oral) Active. Desvenlafaxine Succinate ER (50MG Tablet ER 24HR, Oral) Active. Meloxicam (15MG Tablet, Oral) Active. Methocarbamol (500MG Tablet, Oral) Active. Fluticasone Propionate (50MCG/ACT Suspension, Nasal) Active. Calcium Carbonate-Vitamin D (500-400MG-UNIT Tablet, Oral daily) Active. Vitamin D (Cholecalciferol) (Oral daily) Specific strength unknown - Active. Coconut Oil (daily) Active. Digestive Enzymes (Oral daily) Active. Linzess (145MCG Capsule, Oral daily) Active. Valium (5MG Tablet, Oral qhs) Active. Magnesium Hydroxide (Oral daily) Specific strength unknown - Active. Omega 3 (Oral daily) Specific strength unknown - Active. Paxil (10MG Tablet, Oral daily) Active. ICaps Areds 2 (Oral) Specific strength unknown - Active. Vitamin B12 (Oral) Specific strength unknown - Active.  Vitals  Weight: 129.8 lb Height: 63.5in Body Surface Area: 1.62 m Body Mass Index: 22.63 kg/m  Temp.: 97.41F(Temporal)  Pulse: 90 (Regular)  P.OX: 95% (Room air) BP: 152/98  (Sitting, Left Arm, Standard)       Physical Exam The physical exam findings  are as follows: Note:WDWN in NAD Eyes: Pupils equal, round; sclera anicteric HENT: Oral mucosa moist; good dentition Neck: No masses palpated, no thyromegaly Breasts: symmetric; no nipple retraction or discharge; no lymphadenopathy; no dominant palpable masses Lungs: CTA bilaterally; normal respiratory effort CV: Regular rate and rhythm; no murmurs; extremities well-perfused with no edema Abd: +bowel sounds, soft, non-tender, no palpable organomegaly; no palpable hernias Skin: Warm, dry; no sign of jaundice Psychiatric - alert and oriented x 4; calm mood and affect    Assessment & Plan  LOBULAR CARCINOMA OF BREAST, RIGHT (C50.911) Current Plans  Widespread disease  Will proceed with right mastectomy with sentinel lymph node biopsy/ blue dye injection.  The surgical procedure has been discussed with the patient.  Potential risks, benefits, alternative treatments, and expected outcomes have been explained.  All of the patient's questions at this time have been answered.  The likelihood of reaching the patient's treatment goal is good.  The patient understand the proposed surgical procedure and wishes to proceed.   Imogene Burn. Georgette Dover, MD, [facility] Surgery  General/ Trauma Surgery Beeper [phone]  05/15/2018 5:46 PM

## 2018-05-15 NOTE — H&P (View-Only) (Signed)
PCP - Jonathon Jordan, MD Referred by Everlean Alstrom for right breast invasive lobular carcinoma  This is a 67 year old female in good health who presents after recent screening mammogram showing a possible right breast mass. She reports no problems prior to the mammogram. No palpable masses, no lymphadenopathy, no breast pain, no nipple changes or discharge. Work-up resulted in a core biopsy at 1:00 4 cmfn showing invasive lobular with LCIS, ER/ PR +, Her 2 negative. She has very dense breasts. On ultrasound, a second lesion was at 1:00 8 cmfn, but this was not found at the time of biopsy.  Subsequent MRI showed other suspicious findings in the right breast.  Two of these were biopsied under MR guidance.  One showed invasive carcinoma and the other showed atypical lobular hyperplasia.  After further discussion, she is opting for mastectomy.  She met with Dr. Iran Planas to discuss options for reconstruction, but has made the decision to proceed with mastectomy without reconstruction.  Menarche - age 59 First pregnancy - age 27 Breastfeed yes Hormones - OCP 10 years Menopause 55 Family history - mother with Paget's disease, first cousin with breast cancer   Path - Invasive lobular carcinoma Grade I - right breast at 1:00, LCIS; ER/ PR positive, Her2 negative, Ki67 <1%  CLINICAL DATA: 67 year old female recalled from screening mammogram dated 03/24/2018 for a possible right breast mass.  EXAM: DIGITAL DIAGNOSTIC RIGHT MAMMOGRAM WITH CAD AND TOMO  ULTRASOUND RIGHT BREAST  COMPARISON: Previous exam(s).  ACR Breast Density Category c: The breast tissue is heterogeneously dense, which may obscure small masses.  FINDINGS: A persistent mass in the upper inner quadrant of the right breast demonstrates spiculated margins. Further evaluation of this area with ultrasound was performed.  Mammographic images were processed with CAD.  Targeted ultrasound is performed, showing an  irregular, hypoechoic mass with posterior acoustic shadowing and associated vascularity at the 1 o'clock position 4 cm from the nipple. It measures 8 x 8 x 6 mm. This correlates well with the mammographic finding.  Upon further evaluation, an irregular hyperechoic mass with associated posterior acoustic shadowing is demonstrated at the 1 o'clock position 8 cm from the nipple. There is suggestion of associated architectural distortion sonographically. It measures 7 x 6 x 5 mm.  Evaluation of the right axilla demonstrates no suspicious lymphadenopathy.  IMPRESSION: 1. Suspicious right breast mass at the 1 o'clock position 4 cm from the nipple corresponding with the screening mammographic findings. Recommendation is for ultrasound-guided biopsy. 2. Indeterminate right breast mass at the 1 o'clock position 8 cm from the nipple. Recommendation is for ultrasound-guided biopsy. 3. No suspicious right axillary lymphadenopathy.  RECOMMENDATION: 1. Two area ultrasound-guided biopsy of the right breast. 2. Pending biopsy results, bilateral breast MRI is recommended given the patient's breast density.  I have discussed the findings and recommendations with the patient. Results were also provided in writing at the conclusion of the visit. If applicable, a reminder letter will be sent to the patient regarding the next appointment.  BI-RADS CATEGORY 4: Suspicious.   Electronically Signed By: Kristopher Oppenheim M.D. On: 04/01/2018 14:40  CLINICAL DATA: 67 year old female with a suspicious mass in the right breast at 1 o'clock 4 cm from nipple felt to correspond with suspicious distortion seen in the right breast mammographically.  EXAM: ULTRASOUND GUIDED RIGHT BREAST CORE NEEDLE BIOPSY  COMPARISON: Previous exam(s).  FINDINGS: The pre biopsy ultrasound demonstrated the mass in the upper inner right breast at 1 o'clock 4 cm from the  nipple. This is felt to correspond with the  distortion seen in the right breast on mammography. The additional subtle mass seen in the right breast at 1 o'clock 8 cm from the nipple could not be reproduced on today's ultrasound despite extensive scanning with only areas of dense fibroglandular tissue seen in this location.  I met with the patient and we discussed the procedure of ultrasound-guided biopsy, including benefits and alternatives. We discussed the high likelihood of a successful procedure. We discussed the risks of the procedure, including infection, bleeding, tissue injury, clip migration, and inadequate sampling. Informed written consent was given. The usual time-out protocol was performed immediately prior to the procedure.  Lesion quadrant: Upper inner  Using sterile technique and 1% Lidocaine as local anesthetic, under direct ultrasound visualization, a 12 gauge spring-loaded device was used to perform biopsy of the hypoechoic mass in the right breast at 1 o'clock 4 cm from nipple using a medial to lateral approach. At the conclusion of the procedure a ribbon shaped tissue marker clip was deployed into the biopsy cavity. Follow up 2 view mammogram was performed and dictated separately.  IMPRESSION: 1. Ultrasound-guided biopsy of the mass in the right breast at the 1 o'clock position 4 cm from nipple.  2. The additional previously seen mass in the right breast at 1 o'clock 8 cm from nipple could not be reproduced on today's repeat ultrasound with only areas of dense fibroglandular tissue identified in this location.  3. Further evaluation with breast MRI is suggested to ensure no persistent additional suspicious masses are identified in the right breast, especially given dense fibroglandular tissue.  Electronically Signed: By: Everlean Alstrom M.D. On: 04/11/2018 14:41  CLINICAL DATA: Post ultrasound-guided biopsy of a suspicious mass/distortion in the upper inner right breast.  EXAM: DIAGNOSTIC  RIGHT MAMMOGRAM POST ULTRASOUND BIOPSY  COMPARISON: Previous exam(s).  FINDINGS: Mammographic images were obtained following ultrasound guided biopsy of a suspicious mass/distortion in the upper inner right breast at the 1 o'clock position. A ribbon shaped biopsy marking clip is present at the site of the biopsied mass in the upper inner right breast.  IMPRESSION: Ribbon shaped biopsy marking clip at site of biopsied mass/distortion in the upper inner right breast.  Final Assessment: Post Procedure Mammograms for Marker Placement   Electronically Signed By: Everlean Alstrom M.D. On: 04/11/2018 14:50     Allergies  Bactrim *ANTI-INFECTIVE AGENTS - MISC.*  Anaphylaxis. Allergies Reconciled   Medication History  Pristiq (50MG Tablet ER 24HR, Oral) Active. Anusol-HC (25MG Suppository, 1 (one) Rectal two times daily, Taken starting 08/13/2017) Active. LORazepam (0.5MG Tablet, Oral) Active. Desvenlafaxine Succinate ER (50MG Tablet ER 24HR, Oral) Active. Meloxicam (15MG Tablet, Oral) Active. Methocarbamol (500MG Tablet, Oral) Active. Fluticasone Propionate (50MCG/ACT Suspension, Nasal) Active. Calcium Carbonate-Vitamin D (500-400MG-UNIT Tablet, Oral daily) Active. Vitamin D (Cholecalciferol) (Oral daily) Specific strength unknown - Active. Coconut Oil (daily) Active. Digestive Enzymes (Oral daily) Active. Linzess (145MCG Capsule, Oral daily) Active. Valium (5MG Tablet, Oral qhs) Active. Magnesium Hydroxide (Oral daily) Specific strength unknown - Active. Omega 3 (Oral daily) Specific strength unknown - Active. Paxil (10MG Tablet, Oral daily) Active. ICaps Areds 2 (Oral) Specific strength unknown - Active. Vitamin B12 (Oral) Specific strength unknown - Active.  Vitals  Weight: 129.8 lb Height: 63.5in Body Surface Area: 1.62 m Body Mass Index: 22.63 kg/m  Temp.: 97.41F(Temporal)  Pulse: 90 (Regular)  P.OX: 95% (Room air) BP: 152/98  (Sitting, Left Arm, Standard)       Physical Exam The physical exam findings  are as follows: Note:WDWN in NAD Eyes: Pupils equal, round; sclera anicteric HENT: Oral mucosa moist; good dentition Neck: No masses palpated, no thyromegaly Breasts: symmetric; no nipple retraction or discharge; no lymphadenopathy; no dominant palpable masses Lungs: CTA bilaterally; normal respiratory effort CV: Regular rate and rhythm; no murmurs; extremities well-perfused with no edema Abd: +bowel sounds, soft, non-tender, no palpable organomegaly; no palpable hernias Skin: Warm, dry; no sign of jaundice Psychiatric - alert and oriented x 4; calm mood and affect    Assessment & Plan  LOBULAR CARCINOMA OF BREAST, RIGHT (C50.911) Current Plans  Widespread disease  Will proceed with right mastectomy with sentinel lymph node biopsy/ blue dye injection.  The surgical procedure has been discussed with the patient.  Potential risks, benefits, alternative treatments, and expected outcomes have been explained.  All of the patient's questions at this time have been answered.  The likelihood of reaching the patient's treatment goal is good.  The patient understand the proposed surgical procedure and wishes to proceed.   Imogene Burn. Georgette Dover, MD, [facility] Surgery  General/ Trauma Surgery Beeper [phone]  05/15/2018 5:46 PM

## 2018-05-16 ENCOUNTER — Encounter: Payer: Self-pay | Admitting: General Practice

## 2018-05-16 NOTE — Progress Notes (Signed)
[place] Psychosocial Distress Screening Clinical Social Work  Clinical Social Work was referred by distress screening protocol.  The patient scored a 7 on the Psychosocial Distress Thermometer which indicates moderate distress. Clinical Social Worker contacted patient by phone to assess for distress and other psychosocial needs. Unable to reach patient, left VM w details about [place], contact information and encouragement to call back if desired.    [facility] DISTRESS SCREENING 05/13/2018  Screening Type Initial Screening  Distress experienced in past week (1-10) 7  Emotional problem type Depression;Nervousness/Anxiety;Adjusting to illness;Adjusting to appearance changes  Physical Problem type Nausea/vomiting;Sleep/insomnia  Other [phone]    Clinical Social Worker follow up needed: No.  Await call back from patient.   If yes, follow up plan:  Beverely Pace, [place], LCSW Clinical Social Worker Phone:  [phone]

## 2018-05-20 ENCOUNTER — Encounter: Payer: Self-pay | Admitting: Genetics

## 2018-05-20 ENCOUNTER — Telehealth: Payer: Self-pay | Admitting: Genetics

## 2018-05-20 ENCOUNTER — Telehealth: Payer: Self-pay | Admitting: *Deleted

## 2018-05-20 ENCOUNTER — Ambulatory Visit: Payer: Self-pay | Admitting: Genetics

## 2018-05-20 ENCOUNTER — Other Ambulatory Visit: Payer: Self-pay | Admitting: *Deleted

## 2018-05-20 ENCOUNTER — Telehealth: Payer: Self-pay

## 2018-05-20 DIAGNOSIS — C50411 Malignant neoplasm of upper-outer quadrant of right female breast: Secondary | ICD-10-CM

## 2018-05-20 DIAGNOSIS — Z1379 Encounter for other screening for genetic and chromosomal anomalies: Secondary | ICD-10-CM

## 2018-05-20 DIAGNOSIS — Z803 Family history of malignant neoplasm of breast: Secondary | ICD-10-CM

## 2018-05-20 DIAGNOSIS — Z170 Estrogen receptor positive status [ER+]: Principal | ICD-10-CM

## 2018-05-20 NOTE — Progress Notes (Signed)
HPI:  Lisa Yoder was previously seen in the [place] clinic on 06/13/2018 due to a personal and family history of breast cancer and concerns regarding a hereditary predisposition to cancer. Please refer to our prior cancer genetics clinic note for more information regarding Lisa Yoder's medical, social and family histories, and our assessment and recommendations, at the time. Lisa Yoder recent genetic test results were disclosed to her, as well as recommendations warranted by these results. These results and recommendations are discussed in more detail below.  CANCER HISTORY:    Malignant neoplasm of upper-outer quadrant of right breast in female, estrogen receptor positive ([place])   05/05/2018 Initial Diagnosis    Screening detected right breast mass, MRI revealed UIQ 1.5 x 0.8 x 0.8 cm mass.  Additional masses 5 mm size and 7 mm size and several smaller enhancing masses in the right breast, biopsy of the 1.5 cm mass invasive lobular cancer with LCIS ER 20%, PR 20%, Ki-67 less than 1%, HER-2 negative; biopsy of additional masses showed ALH and ILC with LCIS ER 100%, PR 70%, Ki-67 5%, HER-2 1+ negative      FAMILY HISTORY:  We obtained a detailed, 4-generation family history.  Significant diagnoses are listed below: Family History  Problem Relation Age of Onset  . Breast cancer Mother 93  . Hypertension Father   . Mental illness Sister   . Cancer Maternal Grandfather        blood cancer?  . Breast cancer Cousin        dx under 50  . Cancer Paternal Aunt        type unk  . Cancer Other        type unk    Lisa Yoder has a daughter, leah, who is 14 with no history of cancer.  She has Turner Syndrome. Lisa Yoder also has a son who sis 3 with no history of cancer. He has a son and a daughter.   Lisa Yoder father: 3, no hx of cancer.  Paternal aunts/Uncles: 5 paternal aunts, 5 paternal uncles.  1 paternal aunt had cancer, type unk, no other history of cancer in  these relatives.  1 additional aunt/uncle died at birth Paternal cousins: no history of cancer known Paternal grandfather: died older than 42 no hx of cancer Paternal grandmother:died older than 15, no hx of cancer.  Lisa Yoder mother: dx with breast cancer at 25, died at 110. Maternal Aunts/Uncles: 4 paternal aunts, no hx of cancer.  Maternal cousins: 1 maternal cousin who was dx with breast cancer under the age of 22. Maternal grandfather: hx of cancer- thinks it was a blood cancer? He had a sibling who had cancer- type unk Maternal grandmother:died in 'old age' with no hx of cancer  Lisa Yoder is unaware of previous family history of genetic testing for hereditary cancer risks. Patient's maternal ancestors are of [place] descent, and paternal ancestors are of English/[place] European descent. There is no reported Ashkenazi Jewish ancestry. There is no known consanguinity.  GENETIC TEST RESULTS: Genetic testing performed through [facility]'s Multi-Cancer panel reported out on 05/20/2018 showed no pathogenic mutations. The Multi-Cancer Panel offered by [facility] includes sequencing and/or deletion duplication testing of the following 91 genes: AIP, ALK, APC, ATM, AXIN2, BAP1, BARD1, BLM, BMPR1A, BRCA1, BRCA2, BRIP1, BUB1B, CASR, CDC73, CDH1, CDK4, CDKN1B, CDKN1C, CDKN2A, CEBPA, CEP57, CHEK2, CTNNA1, DICER1, DIS3L2, EGFR, ENG, EPCAM, FH, FLCN, GALNT12, GATA2, GPC3, GREM1, HOXB13, HRAS, KIT, MAX, MEN1, MET, MITF, MLH1, MLH3, MSH2, MSH3,  MSH6, MUTYH, NBN, NF1, NF2, NTHL1, PALB2, PDGFRA, PHOX2B, PMS2, POLD1, POLE, POT1, PRKAR1A, PTCH1, PTEN, RAD50, RAD51C, RAD51D, RB1, RECQL4, RET, RNF43, RPS20, RUNX1, SDHA, SDHAF2, SDHB, SDHC, SDHD, SMAD4, SMARCA4, SMARCB1, SMARCE1, STK11, SUFU, TERC, TERT, TMEM127, TP53, TSC1, TSC2, VHL, WRN, WT1  The test report will be scanned into EPIC and will be located under the Molecular Pathology section of the Results Review tab. A portion of the result report is included below for  reference.     We discussed with Lisa Yoder that because current genetic testing is not perfect, it is possible there may be a gene mutation in one of these genes that current testing cannot detect, but that chance is small.  We also discussed, that there could be another gene that has not yet been discovered, or that we have not yet tested, that is responsible for the cancer diagnoses in the family. It is also possible there is a hereditary cause for the cancer in the family that Lisa Yoder did not inherit and therefore was not identified in her testing.  Therefore, it is important to remain in touch with cancer genetics in the future so that we can continue to offer Lisa Yoder the most up to date genetic testing.   ADDITIONAL GENETIC TESTING: We discussed with Lisa Yoder that her genetic testing was fairly extensive.  If there are are genes identified to increase cancer risk that can be analyzed in the future, we would be happy to discuss and coordinate this testing at that time.    CANCER SCREENING RECOMMENDATIONS: Lisa Yoder test result is negative (normal).  This means that we have not identified a hereditary cause for her personal and family history of cancer at this time.   While very reassuring, this does not definitively rule out a hereditary predisposition to cancer. It is still possible that there could be genetic mutations that are undetectable by current technology, or genetic mutations in genes that have not been tested or identified to increase cancer risk.  Therefore, it is recommended she continue to follow the cancer management and screening guidelines provided by her oncology and primary healthcare provider. An individual's cancer risk is not determined by genetic test results alone.  Overall cancer risk assessment includes additional factors such as personal medical history, family history, etc.  These should be used to make a personalized plan for cancer prevention and  surveillance.    RECOMMENDATIONS FOR FAMILY MEMBERS:  Relatives in this family might be at some increased risk of developing cancer, over the general population risk, simply due to the family history of cancer.  We recommended women in this family have a yearly mammogram beginning at age 54, or 39 years younger than the earliest onset of cancer, an annual clinical breast exam, and perform monthly breast self-exams. Women in this family should also have a gynecological exam as recommended by their primary provider. All family members should have a colonoscopy by age 8 (or as directed by their doctors).  All family members should inform their physicians about the family history of cancer so their doctors can make the most appropriate screening recommendations for them.   It is also possible there is a hereditary cause for the cancer in Lisa Yoder's family that she did not inherit and therefore was not identified in her.   Therefore, we recommended her cousin who was dx with breast cancer <50 also have genetic counseling and testing. Lisa Yoder will let us know if we can be of  any assistance in coordinating genetic counseling and/or testing for these family members.   FOLLOW-UP: Lastly, we discussed with Lisa Yoder that cancer genetics is a rapidly advancing field and it is possible that new genetic tests will be appropriate for her and/or her family members in the future. We encouraged her to remain in contact with cancer genetics on an annual basis so we can update her personal and family histories and let her know of advances in cancer genetics that may benefit this family.   Our contact number was provided. Lisa Yoder questions were answered to her satisfaction, and she knows she is welcome to call us at anytime with additional questions or concerns.   Ferol Luz, MS, [facility] Certified Genetic Counselor [email or web address]

## 2018-05-20 NOTE — Telephone Encounter (Signed)
SENT REFERRAL TO SCHEDULING AND FILED NOTES 

## 2018-05-20 NOTE — Telephone Encounter (Signed)
Pt left vm regarding possible change in treatment plan. Called pt back, unable to leave msg d/t mail box full. Will try again.

## 2018-05-20 NOTE — Telephone Encounter (Signed)
Spoke to pt regarding surgical decision. Pt wishes to have bil mast without reconstruction. Referral placed for PT for pre-surgical evaluation. Physician team notified.

## 2018-05-20 NOTE — Telephone Encounter (Signed)
Revealed negative genetic testing.    This normal result is reassuring and indicates that it is unlikely Lisa Yoder's cancer is due to a hereditary cause.  It is unlikely that there is an increased risk of another cancer due to a mutation in one of these genes.  However, genetic testing is not perfect, and cannot definitively rule out a hereditary cause.  It will be important for her to keep in contact with genetics to learn if any additional testing may be needed in the future.     Recommend her maternal cousin with breast cancer <50 also consider genetic testing.   Lisa Yoder had questions regarding physical therapy and who to contact for this, and is hoping to find out more about insurance coverage of a bilateral mastectomy vs unilateral- she is going to reach out to nurse navigators to check in regarding who to contact for these questions.

## 2018-05-21 ENCOUNTER — Telehealth: Payer: Self-pay | Admitting: Hematology and Oncology

## 2018-05-21 ENCOUNTER — Ambulatory Visit: Payer: Medicare Other | Attending: Hematology and Oncology | Admitting: Physical Therapy

## 2018-05-21 ENCOUNTER — Other Ambulatory Visit: Payer: Self-pay

## 2018-05-21 DIAGNOSIS — M25611 Stiffness of right shoulder, not elsewhere classified: Secondary | ICD-10-CM | POA: Diagnosis present

## 2018-05-21 DIAGNOSIS — R293 Abnormal posture: Secondary | ICD-10-CM | POA: Insufficient documentation

## 2018-05-21 NOTE — Therapy (Signed)
[place], [place], [address] Phone: [phone]   Fax:  [phone]  Physical Therapy Evaluation  Patient Details  Name: Lisa Yoder MRN: [id number] Date of Birth: Oct 18, 1950 Referring Provider (PT): Lindi Adie   Encounter Date: 05/21/2018  PT End of Session - 05/21/18 1213    Visit Number  1    Number of Visits  1    Date for PT Re-Evaluation  05/21/18    PT Start Time  1100    PT Stop Time  1145    PT Time Calculation (min)  45 min    Activity Tolerance  Patient tolerated treatment well    Behavior During Therapy  [facility] for tasks assessed/performed       Past Medical History:  Diagnosis Date  . Anxiety   . Arthritis   . Cancer ([place]) 03/2018   right breast cancer  . Cataract   . Chronic kidney disease   . Depression   . Family history of breast cancer   . GERD (gastroesophageal reflux disease)   . 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 Osteoporosis   . UTI (urinary tract infection) 8/15  . Wears glasses     Past Surgical History:  Procedure Laterality Date  . CESAREAN SECTION    . COLONOSCOPY    . CYST REMOVAL NECK Left 03/03/2014   Procedure: LEFT NECK CYST EXCISION;  Surgeon: Pedro Earls, MD;  Location: [place];  Service: General;  Laterality: Left;  . DILATION AND CURETTAGE OF UTERUS    . EYE SURGERY    . STRABISMUS SURGERY Bilateral 05/02/2018   Procedure: BILATERAL REPAIR STRABISMUS;  Surgeon: Lamonte Sakai, MD;  Location: [place];  Service: Ophthalmology;  Laterality: Bilateral;  . TONSILLECTOMY AND ADENOIDECTOMY    . UPPER GI ENDOSCOPY      There were no vitals filed for this visit.   Subjective Assessment - 05/21/18 1113    Subjective  Pt says she does Yoga 3-4 times a week and wants to keep doing that .      Pertinent History   lobular cancer on the right and is planning to have bilateral mastectomy on Nov. 21. Will not know if she has  to have chemo and radiation until surgery and Onocotype testing  she notices that she has some decreased range of motion in right shoulder and hip with yoga and has back arthritis and osteoporosis     Patient Stated Goals  to be able to return to exercise     Currently in Pain?  No/denies         [facility] PT Assessment - 05/21/18 0001      Assessment   Medical Diagnosis  right breast cacner     Referring Provider (PT)  Gudena    Onset Date/Surgical Date  --   plans to have surgery Nov. 21    Hand Dominance  Right      Precautions   Precautions  None      Restrictions   Weight Bearing Restrictions  No      Balance Screen   Has the patient fallen in the past 6 months  Yes    How many times?  1 fall when walking    Has the patient had a decrease in activity level because of a fear of falling?   No    Is the patient reluctant to leave their home because of a fear  of falling?   No      Home Film/video editor residence    Living Arrangements  Spouse/significant other    Available Help at Discharge  Available PRN/intermittently   friends with help also as husband in on oxygen 24/7   Additional Comments  pt is looking into moving into [facility]       Prior Function   Level of Independence  Independent    Vocation  Part time employment    Forensic scientist     Leisure  does yoga 3-4 times a week, walks several times a week       Cognition   Overall Cognitive Status  Within Functional Limits for tasks assessed      Posture/Postural Control   Posture/Postural Control  Postural limitations    Posture Comments  pt has mild scoliosis with scapular dyskinesia on the right.       ROM / Strength   AROM / PROM / Strength  AROM;Strength      AROM   Right Shoulder Flexion  160 Degrees    Right Shoulder ABduction  162 Degrees   pain in axilla   Right Shoulder Internal Rotation  25 Degrees    Right Shoulder External Rotation  100  Degrees    Left Shoulder Flexion  170 Degrees    Left Shoulder ABduction  170 Degrees    Left Shoulder Internal Rotation  35 Degrees    Left Shoulder External Rotation  90 Degrees      Strength   Overall Strength  Within functional limits for tasks performed        LYMPHEDEMA/ONCOLOGY QUESTIONNAIRE - 05/21/18 1140      Right Upper Extremity Lymphedema   15 cm Proximal to Olecranon Process  27 cm    Olecranon Process  23.5 cm    15 cm Proximal to Ulnar Styloid Process  22 cm    Just Proximal to Ulnar Styloid Process  14.5 cm    Across Hand at [employer]  18.5 cm    At [place] of 2nd Digit  6 cm      Left Upper Extremity Lymphedema   15 cm Proximal to Olecranon Process  27 cm    Olecranon Process  23 cm    15 cm Proximal to Ulnar Styloid Process  21 cm    Just Proximal to Ulnar Styloid Process  14.5 cm    Across Hand at [employer]  18 cm    At [place] of 2nd Digit  6 cm             Objective measurements completed on examination: See above findings.      [place] Adult PT Treatment/Exercise - 05/21/18 0001      Self-Care   Self-Care  Other Self-Care Comments    Other Self-Care Comments   instructed in post op exercises and precautions to keep shoulder range of motion while drains are in and to avoid excessive trunk rotation until healed  instructed in ABC class and Strength ABC program with a referral after she is recovered from surgery       Exercises   Exercises  --      Shoulder Exercises: Supine   Other Supine Exercises  demonstrated dowel rod exercises for pt to do before surgery and to resume 3-4 weeks after surgery when cleared for more exercise  PT Education - 05/21/18 1213    Education Details  post op educaton     Person(s) Educated  Patient    Methods  Explanation;Handout    Comprehension  Verbalized understanding           Breast Clinic Goals - 05/21/18 1219      Patient will be able to verbalize understanding of  pertinent lymphedema risk reduction practices relevant to her diagnosis specifically related to skin care.   Time  1    Period  Days    Status  Achieved      Patient will be able to return demonstrate and/or verbalize understanding of the post-op home exercise program related to regaining shoulder range of motion.   Time  1    Period  Days    Status  Achieved      Patient will be able to verbalize understanding of the importance of attending the postoperative After Breast Cancer Class for further lymphedema risk reduction education and therapeutic exercise.   Time  1    Period  Days    Status  Achieved            Plan - 05/21/18 1214    Clinical Impression Statement  Pt is seen for a pre op visit in anticipation of bilateral mastectomy later this month due to right breast cancer.  She is active with yoga and walking at this time and notiece some pain with stretching of right shoudler and pain in right hip  She has history of mild scoliosis with scapular dyskinesia on and increased ER / decreased IR of right shoulder.  Her right arm measures mildly larger on the right as she uses it most of the time functionally     History and Personal Factors relevant to plan of care:  history of back arthritis and scoliosis     Clinical Presentation  Stable    Clinical Decision Making  Low    Rehab Potential  Good    PT Frequency  One time visit    PT Treatment/Interventions  ADLs/Self Care Home Management;Therapeutic exercise    PT Next Visit Plan  No follow up at this time. Re-eval with new order to teach strength ABC post op     Consulted and Agree with Plan of Care  Patient       Patient will benefit from skilled therapeutic intervention in order to improve the following deficits and impairments:  Postural dysfunction, Decreased range of motion  Visit Diagnosis: Abnormal posture - Plan: PT plan of care cert/re-cert  Stiffness of right shoulder joint - Plan: PT plan of care  cert/re-cert     Problem List Patient Active Problem List   Diagnosis Date Noted  . Genetic testing 05/20/2018  . Family history of breast cancer   . Malignant neoplasm of upper-outer quadrant of right breast in female, estrogen receptor positive ([place]) 05/08/2018  . Osteoporosis 12/14/2014  . Rash 12/28/2013  . Infected sebaceous cyst of skin 12/22/2013   Donato Heinz. Owens Shark PT  Norwood Levo 05/21/2018, 12:24 PM  [place], [place], [address] Phone: [phone]   Fax:  [phone]  Name: Lisa Yoder MRN: [id number] Date of Birth: Mar 12, 1951

## 2018-05-21 NOTE — Patient Instructions (Signed)
   START THESE 3-4 weeks after surgery        SHOULDER: Flexion - Supine (Cane)        Cancer Rehab [phone]    Hold cane in both hands. Raise arms up overhead. Do not allow back to arch. Hold _5__ seconds. Do __5-10__ times; __1-2__ times a day.   SELF ASSISTED WITH OBJECT: Shoulder Abduction / Adduction - Supine    Hold cane with both hands. Move both arms from side to side, keep elbows straight.  Hold when stretch felt for __5__ seconds. Repeat __5-10__ times; __1-2__ times a day. Once this becomes easier progress to third picture bringing affected arm towards ear by staying out to side. Same hold for _5_seconds. Repeat  _5-10_ times, _1-2_ times/day.  Shoulder Blade Stretch    Clasp fingers behind head with elbows touching in front of face. Pull elbows back while pressing shoulder blades together. Relax and hold as tolerated, can place pillow under elbow here for comfort as needed and to allow for prolonged stretch.  Repeat __5__ times. Do __1-2__ sessions per day.     SHOULDER: External Rotation - Supine (Cane)    Hold cane with both hands. Rotate arm away from body. Keep elbow on floor and next to body. _5-10__ reps per set, hold 5 seconds, _1-2__ sets per day. Add towel to keep elbow at side.  Copyright  VHI. All rights reserved.    Flexion (Eccentric) - Active-Assist (Cane)          Cancer Rehab [phone]    Use unaffected arm to push affected arm forward. Avoid hiking shoulder (shoulder should NOT touch cheek). Keep palm relaxed. Slowly lower affected arm. Hold stretch for _5_ seconds repeating _5-10_ times, _1-2_ times a day.  Abduction (Eccentric) - Active-Assist (Cane)    Use unaffected arm to push affected arm out to side. Avoid hiking shoulder (shoulder should NOT touch cheek). Keep palm relaxed. Slowly lower affected arm. Hold stretch _5_ seconds repeating _5-10_ times, _1-2_ times a day.  Cane Exercise: Extension   Stand holding cane behind  back with both hands palm-up. Lift the cane away from body until gentle stretch felt. Do NOT lean forward.  Hold __5__ seconds. Repeat _5-10___ times. Do _1-2___ sessions per day.  CHEST: Doorway, Bilateral - Standing    Standing in doorway, place hands on wall with elbows bent at shoulder height and place one foot in front of other. Shift weight onto front foot. Hold _10-20__ seconds. Do _3-5_ times, _1-2_ times a day.

## 2018-05-21 NOTE — Telephone Encounter (Signed)
Scheduled appt per 1/15 sch message - pt is aware of appt date and time   

## 2018-05-26 ENCOUNTER — Encounter: Payer: Medicare Other | Admitting: Nutrition

## 2018-05-26 ENCOUNTER — Other Ambulatory Visit: Payer: Medicare Other

## 2018-05-26 ENCOUNTER — Encounter: Payer: Medicare Other | Admitting: Licensed Clinical Social Worker

## 2018-05-27 NOTE — Pre-Procedure Instructions (Signed)
Alantra LAURE LEONE  05/27/2018      [id number] [place], [address] AT [facility] OF [address] [place] [address] [place] [employer] [place] [address] Phone: [phone] Fax: [phone]    Your procedure is scheduled on Thursday November 21.  Report to [facility] Admitting at 5:30 A.M.  Call this number if you have problems the morning of surgery:  [phone]   Remember:  Do not eat or drink after midnight.  You may drink clear liquids until 4:30 AM (3 hours prior to surgery) .  Clear liquids allowed are:  Water, Juice (non-citric and without pulp), Black Coffee only and Gatorade    Take these medicines the morning of surgery with A SIP OF WATER:   Desvenlafaxine (prestiq) Tamoxifen (Nolvadex) Eye drops  7 days prior to surgery STOP taking any Meloxicam (mobic), Aspirin(unless otherwise instructed by your surgeon), Aleve, Naproxen, Ibuprofen, Motrin, Advil, Goody's, BC's, all herbal medications, fish oil, and all vitamins     Do not wear jewelry, make-up or nail polish.  Do not wear lotions, powders, or perfumes, or deodorant.  Do not shave 48 hours prior to surgery.  Men may shave face and neck.  Do not bring valuables to the hospital.  [facility] is not responsible for any belongings or valuables.  Contacts, dentures or bridgework may not be worn into surgery.  Leave your suitcase in the car.  After surgery it may be brought to your room.  For patients admitted to the hospital, discharge time will be determined by your treatment team.  Patients discharged the day of surgery will not be allowed to drive home.    Special instructions:    [facility]- Preparing For Surgery  Before surgery, you can play an important role. Because skin is not sterile, your skin needs to be as free of germs as possible. You can reduce the number of germs on your skin by washing with CHG (chlorahexidine gluconate) Soap before surgery.  CHG is  an antiseptic cleaner which kills germs and bonds with the skin to continue killing germs even after washing.    Oral Hygiene is also important to reduce your risk of infection.  Remember - BRUSH YOUR TEETH THE MORNING OF SURGERY WITH YOUR REGULAR TOOTHPASTE  Please do not use if you have an allergy to CHG or antibacterial soaps. If your skin becomes reddened/irritated stop using the CHG.  Do not shave (including legs and underarms) for at least 48 hours prior to first CHG shower. It is OK to shave your face.  Please follow these instructions carefully.   1. Shower the NIGHT BEFORE SURGERY and the MORNING OF SURGERY with CHG.   2. If you chose to wash your hair, wash your hair first as usual with your normal shampoo.  3. After you shampoo, rinse your hair and body thoroughly to remove the shampoo.  4. Use CHG as you would any other liquid soap. You can apply CHG directly to the skin and wash gently with a scrungie or a clean washcloth.   5. Apply the CHG Soap to your body ONLY FROM THE NECK DOWN.  Do not use on open wounds or open sores. Avoid contact with your eyes, ears, mouth and genitals (private parts). Wash Face and genitals (private parts)  with your normal soap.  6. Wash thoroughly, paying special attention to the area where your surgery will be performed.  7. Thoroughly rinse your body  with warm water from the neck down.  8. DO NOT shower/wash with your normal soap after using and rinsing off the CHG Soap.  9. Pat yourself dry with a CLEAN TOWEL.  10. Wear CLEAN PAJAMAS to bed the night before surgery, wear comfortable clothes the morning of surgery  11. Place CLEAN SHEETS on your bed the night of your first shower and DO NOT SLEEP WITH PETS.    Day of Surgery:  Do not apply any deodorants/lotions.  Please wear clean clothes to the hospital/surgery center.   Remember to brush your teeth WITH YOUR REGULAR TOOTHPASTE.    Please read over the following fact sheets that  you were given. Coughing and Deep Breathing and Surgical Site Infection Prevention

## 2018-05-28 ENCOUNTER — Other Ambulatory Visit: Payer: Self-pay

## 2018-05-28 ENCOUNTER — Encounter (HOSPITAL_COMMUNITY)
Admission: RE | Admit: 2018-05-28 | Discharge: 2018-05-28 | Disposition: A | Discharge status: Home / Self Care | Payer: Medicare Other | Source: Ambulatory Visit | Attending: Surgery | Admitting: Surgery

## 2018-05-28 ENCOUNTER — Encounter (HOSPITAL_COMMUNITY): Payer: Self-pay

## 2018-05-28 DIAGNOSIS — Z01812 Encounter for preprocedural laboratory examination: Secondary | ICD-10-CM | POA: Insufficient documentation

## 2018-05-28 HISTORY — DX: Personal history of other diseases of the digestive system: Z87.19

## 2018-05-28 LAB — COMPREHENSIVE METABOLIC PANEL
ALBUMIN: 3.9 g/dL (ref 3.5–5.0)
ALK PHOS: 78 U/L (ref 38–126)
ALT: 16 U/L (ref 0–44)
AST: 17 U/L (ref 15–41)
Anion gap: 10 (ref 5–15)
BILIRUBIN TOTAL: 0.6 mg/dL (ref 0.3–1.2)
BUN: 17 mg/dL (ref 8–23)
CALCIUM: 9.3 mg/dL (ref 8.9–10.3)
CO2: 27 mmol/L (ref 22–32)
CREATININE: 1 mg/dL (ref 0.44–1.00)
Chloride: 103 mmol/L (ref 98–111)
GFR calc Af Amer: >60 mL/min (ref >60)
GFR calc non Af Amer: 56 mL/min — ABNORMAL LOW (ref >60)
GLUCOSE: 105 mg/dL — AB (ref 70–99)
Potassium: 4 mmol/L (ref 3.5–5.1)
SODIUM: 140 mmol/L (ref 135–145)
Total Protein: 6.9 g/dL (ref 6.5–8.1)

## 2018-05-28 LAB — CBC
HEMATOCRIT: 44.1 % (ref 36.0–46.0)
HEMOGLOBIN: 13.2 g/dL (ref 12.0–15.0)
MCH: 28 pg (ref 26.0–34.0)
MCHC: 29.9 g/dL — ABNORMAL LOW (ref 30.0–36.0)
MCV: 93.6 fL (ref 80.0–100.0)
Platelets: 245 10*3/uL (ref 150–400)
RBC: 4.71 MIL/uL (ref 3.87–5.11)
RDW: 12.8 % (ref 11.5–15.5)
WBC: 5.8 10*3/uL (ref 4.0–10.5)
nRBC: 0 % (ref 0.0–0.2)

## 2018-05-28 NOTE — Progress Notes (Signed)
PCP - Dr. Jonathon Jordan  Chest x-ray -  N/A EKG -  N/A  Blood Thinner Instructions: N/A Aspirin Instructions: N/A  Anesthesia review: none  Patient denies shortness of breath, fever, cough and chest pain at PAT appointment   Patient verbalized understanding of instructions that were given to them at the PAT appointment. Patient was also instructed that they will need to review over the PAT instructions again at home before surgery.

## 2018-05-29 ENCOUNTER — Encounter (INDEPENDENT_AMBULATORY_CARE_PROVIDER_SITE_OTHER): Payer: Medicare Other

## 2018-05-29 ENCOUNTER — Other Ambulatory Visit: Payer: Self-pay | Admitting: Family Medicine

## 2018-05-29 ENCOUNTER — Ambulatory Visit: Payer: Self-pay | Admitting: Surgery

## 2018-05-29 DIAGNOSIS — I10 Essential (primary) hypertension: Secondary | ICD-10-CM | POA: Diagnosis not present

## 2018-06-04 NOTE — Anesthesia Preprocedure Evaluation (Addendum)
Anesthesia Evaluation  Patient identified by MRN, date of birth, ID band Patient awake    Reviewed: Allergy & Precautions, H&P , NPO status , Patient's Chart, lab work & pertinent test results  Airway Mallampati: II  TM Distance: >3 FB Neck ROM: Full    Dental no notable dental hx. (+) Teeth Intact, Dental Advisory Given   Pulmonary neg pulmonary ROS, former smoker,    Pulmonary exam normal breath sounds clear to auscultation       Cardiovascular Exercise Tolerance: Good negative cardio ROS   Rhythm:Regular Rate:Normal     Neuro/Psych Anxiety Depression negative neurological ROS  negative psych ROS   GI/Hepatic Neg liver ROS, GERD  Controlled,  Endo/Other  negative endocrine ROS  Renal/GU negative Renal ROS  negative genitourinary   Musculoskeletal  (+) Arthritis , Osteoarthritis,    Abdominal   Peds  Hematology negative hematology ROS (+)   Anesthesia Other Findings   Reproductive/Obstetrics negative OB ROS                            Anesthesia Physical Anesthesia Plan  ASA: II  Anesthesia Plan: General   Post-op Pain Management:  Regional for Post-op pain   Induction: Intravenous  PONV Risk Score and Plan: 4 or greater and Ondansetron, Dexamethasone and Midazolam  Airway Management Planned: Oral ETT and LMA  Additional Equipment:   Intra-op Plan:   Post-operative Plan: Extubation in OR  Informed Consent: I have reviewed the patients History and Physical, chart, labs and discussed the procedure including the risks, benefits and alternatives for the proposed anesthesia with the patient or authorized representative who has indicated his/her understanding and acceptance.   Dental advisory given  Plan Discussed with: CRNA  Anesthesia Plan Comments:        Anesthesia Quick Evaluation

## 2018-06-05 ENCOUNTER — Ambulatory Visit (HOSPITAL_COMMUNITY): Payer: Medicare Other | Admitting: Anesthesiology

## 2018-06-05 ENCOUNTER — Other Ambulatory Visit: Payer: Self-pay

## 2018-06-05 ENCOUNTER — Encounter (HOSPITAL_COMMUNITY)
Admission: RE | Disposition: A | Discharge status: Home / Self Care | Payer: Self-pay | Source: Ambulatory Visit | Attending: Surgery

## 2018-06-05 ENCOUNTER — Encounter (HOSPITAL_COMMUNITY): Payer: Self-pay

## 2018-06-05 ENCOUNTER — Observation Stay (HOSPITAL_COMMUNITY)
Admission: RE | Admit: 2018-06-05 | Discharge: 2018-06-06 | Disposition: A | Discharge status: Home / Self Care | Payer: Medicare Other | Source: Ambulatory Visit | Attending: Surgery | Admitting: Surgery

## 2018-06-05 ENCOUNTER — Ambulatory Visit (HOSPITAL_COMMUNITY)
Admission: RE | Admit: 2018-06-05 | Discharge: 2018-06-05 | Disposition: A | Discharge status: Home / Self Care | Payer: Medicare Other | Source: Ambulatory Visit | Attending: Surgery | Admitting: Surgery

## 2018-06-05 DIAGNOSIS — Z79899 Other long term (current) drug therapy: Secondary | ICD-10-CM | POA: Insufficient documentation

## 2018-06-05 DIAGNOSIS — Z791 Long term (current) use of non-steroidal anti-inflammatories (NSAID): Secondary | ICD-10-CM | POA: Diagnosis not present

## 2018-06-05 DIAGNOSIS — Z881 Allergy status to other antibiotic agents status: Secondary | ICD-10-CM | POA: Diagnosis not present

## 2018-06-05 DIAGNOSIS — Z170 Estrogen receptor positive status [ER+]: Secondary | ICD-10-CM | POA: Insufficient documentation

## 2018-06-05 DIAGNOSIS — Z87891 Personal history of nicotine dependence: Secondary | ICD-10-CM | POA: Diagnosis not present

## 2018-06-05 DIAGNOSIS — C50911 Malignant neoplasm of unspecified site of right female breast: Secondary | ICD-10-CM

## 2018-06-05 DIAGNOSIS — Z7951 Long term (current) use of inhaled steroids: Secondary | ICD-10-CM | POA: Diagnosis not present

## 2018-06-05 DIAGNOSIS — F419 Anxiety disorder, unspecified: Secondary | ICD-10-CM | POA: Insufficient documentation

## 2018-06-05 DIAGNOSIS — Z87892 Personal history of anaphylaxis: Secondary | ICD-10-CM | POA: Insufficient documentation

## 2018-06-05 DIAGNOSIS — F329 Major depressive disorder, single episode, unspecified: Secondary | ICD-10-CM | POA: Insufficient documentation

## 2018-06-05 HISTORY — DX: Malignant neoplasm of unspecified site of right female breast: C50.911

## 2018-06-05 HISTORY — DX: Low back pain: M54.5

## 2018-06-05 HISTORY — DX: Other chronic pain: G89.29

## 2018-06-05 HISTORY — PX: MASTECTOMY COMPLETE / SIMPLE W/ SENTINEL NODE BIOPSY: SUR846

## 2018-06-05 HISTORY — DX: Low back pain, unspecified: M54.50

## 2018-06-05 HISTORY — DX: Squamous cell carcinoma of skin, unspecified: C44.92

## 2018-06-05 HISTORY — PX: MASTECTOMY W/ SENTINEL NODE BIOPSY: SHX2001

## 2018-06-05 HISTORY — DX: Unspecified nephritic syndrome with unspecified morphologic changes: N05.9

## 2018-06-05 HISTORY — PX: MASTECTOMY: SHX3

## 2018-06-05 LAB — CBC
HCT: 40.2 % (ref 36.0–46.0)
HEMOGLOBIN: 12.6 g/dL (ref 12.0–15.0)
MCH: 29.1 pg (ref 26.0–34.0)
MCHC: 31.3 g/dL (ref 30.0–36.0)
MCV: 92.8 fL (ref 80.0–100.0)
PLATELETS: 177 10*3/uL (ref 150–400)
RBC: 4.33 MIL/uL (ref 3.87–5.11)
RDW: 12.7 % (ref 11.5–15.5)
WBC: 8.9 10*3/uL (ref 4.0–10.5)
nRBC: 0 % (ref 0.0–0.2)

## 2018-06-05 LAB — CREATININE, SERUM
CREATININE: 0.96 mg/dL (ref 0.44–1.00)
GFR calc Af Amer: >60 mL/min (ref >60)
GFR, EST NON AFRICAN AMERICAN: 59 mL/min — AB (ref >60)

## 2018-06-05 SURGERY — MASTECTOMY WITH SENTINEL LYMPH NODE BIOPSY
Anesthesia: General | Site: Breast | Laterality: Bilateral

## 2018-06-05 MED ORDER — ACETAMINOPHEN 325 MG PO TABS
650.0000 mg | ORAL_TABLET | Freq: Four times a day (QID) | ORAL | Status: DC | PRN
  Administered 2018-06-05: 650 mg via ORAL
  Filled 2018-06-05: qty 2

## 2018-06-05 MED ORDER — LACTATED RINGERS IV SOLN
INTRAVENOUS | Status: DC | PRN
  Administered 2018-06-05 (×2): via INTRAVENOUS

## 2018-06-05 MED ORDER — DEXAMETHASONE SODIUM PHOSPHATE 10 MG/ML IJ SOLN
INTRAMUSCULAR | Status: DC | PRN
  Administered 2018-06-05: 10 mg via INTRAVENOUS

## 2018-06-05 MED ORDER — TRAMADOL HCL 50 MG PO TABS
50.0000 mg | ORAL_TABLET | Freq: Four times a day (QID) | ORAL | Status: DC | PRN
  Administered 2018-06-05 – 2018-06-06 (×2): 50 mg via ORAL
  Filled 2018-06-05 (×2): qty 1

## 2018-06-05 MED ORDER — ACETAMINOPHEN 500 MG PO TABS
1000.0000 mg | ORAL_TABLET | ORAL | Status: AC
  Administered 2018-06-05: 1000 mg via ORAL
  Filled 2018-06-05: qty 2

## 2018-06-05 MED ORDER — ALBUMIN HUMAN 5 % IV SOLN
INTRAVENOUS | Status: DC | PRN
  Administered 2018-06-05: 08:00:00 via INTRAVENOUS

## 2018-06-05 MED ORDER — SODIUM CHLORIDE 0.9 % IV SOLN
INTRAVENOUS | Status: DC
  Administered 2018-06-05: 14:00:00 via INTRAVENOUS

## 2018-06-05 MED ORDER — ENOXAPARIN SODIUM 40 MG/0.4ML ~~LOC~~ SOLN: 40.0000 mg | SUBCUTANEOUS | Status: DC

## 2018-06-05 MED ORDER — CHLORHEXIDINE GLUCONATE CLOTH 2 % EX PADS: 6.0000 | MEDICATED_PAD | Freq: Once | CUTANEOUS | Status: DC

## 2018-06-05 MED ORDER — GABAPENTIN 300 MG PO CAPS
300.0000 mg | ORAL_CAPSULE | ORAL | Status: AC
  Administered 2018-06-05: 300 mg via ORAL
  Filled 2018-06-05: qty 1

## 2018-06-05 MED ORDER — FENTANYL CITRATE (PF) 100 MCG/2ML IJ SOLN
INTRAMUSCULAR | Status: AC
  Filled 2018-06-05: qty 2

## 2018-06-05 MED ORDER — MIDAZOLAM HCL 5 MG/5ML IJ SOLN
INTRAMUSCULAR | Status: DC | PRN
  Administered 2018-06-05 (×2): 1 mg via INTRAVENOUS

## 2018-06-05 MED ORDER — LORAZEPAM 0.5 MG PO TABS
0.5000 mg | ORAL_TABLET | Freq: Every day | ORAL | Status: DC
  Administered 2018-06-05: 0.5 mg via ORAL
  Filled 2018-06-05: qty 1

## 2018-06-05 MED ORDER — PROPOFOL 10 MG/ML IV BOLUS
INTRAVENOUS | Status: DC | PRN
  Administered 2018-06-05: 120 mg via INTRAVENOUS

## 2018-06-05 MED ORDER — ACETAMINOPHEN 650 MG RE SUPP: 650.0000 mg | Freq: Four times a day (QID) | RECTAL | Status: DC | PRN

## 2018-06-05 MED ORDER — MIDAZOLAM HCL 2 MG/2ML IJ SOLN
INTRAMUSCULAR | Status: AC
  Filled 2018-06-05: qty 2

## 2018-06-05 MED ORDER — BRIMONIDINE TARTRATE 0.15 % OP SOLN
1.0000 [drp] | Freq: Every day | OPHTHALMIC | Status: DC
  Filled 2018-06-05: qty 5

## 2018-06-05 MED ORDER — GLYCOPYRROLATE PF 0.2 MG/ML IJ SOSY
PREFILLED_SYRINGE | INTRAMUSCULAR | Status: DC | PRN
  Administered 2018-06-05: .1 mg via INTRAVENOUS

## 2018-06-05 MED ORDER — TAMOXIFEN CITRATE 10 MG PO TABS
10.0000 mg | ORAL_TABLET | Freq: Every day | ORAL | Status: DC
  Filled 2018-06-05 (×2): qty 1

## 2018-06-05 MED ORDER — CEFAZOLIN SODIUM-DEXTROSE 2-4 GM/100ML-% IV SOLN
2.0000 g | Freq: Three times a day (TID) | INTRAVENOUS | Status: AC
  Administered 2018-06-05: 2 g via INTRAVENOUS
  Filled 2018-06-05: qty 100

## 2018-06-05 MED ORDER — BUPIVACAINE LIPOSOME 1.3 % IJ SUSP
INTRAMUSCULAR | Status: DC | PRN
  Administered 2018-06-05 (×2): 10 mL

## 2018-06-05 MED ORDER — DIPHENHYDRAMINE HCL 50 MG/ML IJ SOLN: 12.5000 mg | Freq: Four times a day (QID) | INTRAMUSCULAR | Status: DC | PRN

## 2018-06-05 MED ORDER — LIDOCAINE 2% (20 MG/ML) 5 ML SYRINGE
INTRAMUSCULAR | Status: DC | PRN
  Administered 2018-06-05: 50 mg via INTRAVENOUS

## 2018-06-05 MED ORDER — FENTANYL CITRATE (PF) 250 MCG/5ML IJ SOLN
INTRAMUSCULAR | Status: DC | PRN
  Administered 2018-06-05 (×4): 25 ug via INTRAVENOUS

## 2018-06-05 MED ORDER — ONDANSETRON 4 MG PO TBDP: 4.0000 mg | ORAL_TABLET | Freq: Four times a day (QID) | ORAL | Status: DC | PRN

## 2018-06-05 MED ORDER — BUPIVACAINE HCL (PF) 0.25 % IJ SOLN
INTRAMUSCULAR | Status: DC | PRN
  Administered 2018-06-05 (×2): 20 mL

## 2018-06-05 MED ORDER — EPHEDRINE SULFATE 50 MG/ML IJ SOLN
INTRAMUSCULAR | Status: DC | PRN
  Administered 2018-06-05: 5 mg via INTRAVENOUS
  Administered 2018-06-05: 15 mg via INTRAVENOUS
  Administered 2018-06-05: 10 mg via INTRAVENOUS
  Administered 2018-06-05 (×2): 5 mg via INTRAVENOUS

## 2018-06-05 MED ORDER — ONDANSETRON HCL 4 MG/2ML IJ SOLN: 4.0000 mg | Freq: Four times a day (QID) | INTRAMUSCULAR | Status: DC | PRN

## 2018-06-05 MED ORDER — 0.9 % SODIUM CHLORIDE (POUR BTL) OPTIME
TOPICAL | Status: DC | PRN
  Administered 2018-06-05 (×2): 1000 mL

## 2018-06-05 MED ORDER — CEFAZOLIN SODIUM-DEXTROSE 2-4 GM/100ML-% IV SOLN
2.0000 g | INTRAVENOUS | Status: AC
  Administered 2018-06-05: 2 g via INTRAVENOUS
  Filled 2018-06-05: qty 100

## 2018-06-05 MED ORDER — TECHNETIUM TC 99M SULFUR COLLOID FILTERED
1.0000 | Freq: Once | INTRAVENOUS | Status: AC | PRN
  Administered 2018-06-05: 1 via INTRADERMAL

## 2018-06-05 MED ORDER — SODIUM CHLORIDE 0.9 % IV SOLN
INTRAVENOUS | Status: DC | PRN
  Administered 2018-06-05: 20 ug/min via INTRAVENOUS

## 2018-06-05 MED ORDER — MAGNESIUM GLUCONATE 500 (27 MG) MG PO TABS
500.0000 mg | ORAL_TABLET | Freq: Every day | ORAL | Status: DC
Start: 2018-06-05 — End: 2018-06-06
  Filled 2018-06-05 (×3): qty 1

## 2018-06-05 MED ORDER — METHYLENE BLUE 0.5 % INJ SOLN
INTRAVENOUS | Status: AC
  Filled 2018-06-05: qty 10

## 2018-06-05 MED ORDER — MORPHINE SULFATE (PF) 2 MG/ML IV SOLN: 2.0000 mg | INTRAVENOUS | Status: DC | PRN

## 2018-06-05 MED ORDER — METHOCARBAMOL 500 MG PO TABS: 500.0000 mg | ORAL_TABLET | Freq: Four times a day (QID) | ORAL | Status: DC | PRN

## 2018-06-05 MED ORDER — VENLAFAXINE HCL ER 75 MG PO CP24
75.0000 mg | ORAL_CAPSULE | Freq: Every day | ORAL | Status: DC
  Administered 2018-06-06: 75 mg via ORAL
  Filled 2018-06-05: qty 1

## 2018-06-05 MED ORDER — OXYCODONE HCL 5 MG PO TABS: 5.0000 mg | ORAL_TABLET | ORAL | Status: DC | PRN

## 2018-06-05 MED ORDER — PROPOFOL 10 MG/ML IV BOLUS
INTRAVENOUS | Status: AC
  Filled 2018-06-05: qty 20

## 2018-06-05 MED ORDER — PHENYLEPHRINE 40 MCG/ML (10ML) SYRINGE FOR IV PUSH (FOR BLOOD PRESSURE SUPPORT)
PREFILLED_SYRINGE | INTRAVENOUS | Status: DC | PRN
  Administered 2018-06-05: 80 ug via INTRAVENOUS
  Administered 2018-06-05 (×2): 40 ug via INTRAVENOUS

## 2018-06-05 MED ORDER — SODIUM CHLORIDE (PF) 0.9 % IJ SOLN
INTRAMUSCULAR | Status: AC
  Filled 2018-06-05: qty 10

## 2018-06-05 MED ORDER — LINACLOTIDE 145 MCG PO CAPS
290.0000 ug | ORAL_CAPSULE | ORAL | Status: DC
  Filled 2018-06-05 (×2): qty 2

## 2018-06-05 MED ORDER — ONDANSETRON HCL 4 MG/2ML IJ SOLN
INTRAMUSCULAR | Status: DC | PRN
  Administered 2018-06-05: 4 mg via INTRAVENOUS

## 2018-06-05 MED ORDER — METOPROLOL TARTRATE 5 MG/5ML IV SOLN: 5.0000 mg | Freq: Four times a day (QID) | INTRAVENOUS | Status: DC | PRN

## 2018-06-05 MED ORDER — FENTANYL CITRATE (PF) 250 MCG/5ML IJ SOLN
INTRAMUSCULAR | Status: AC
  Filled 2018-06-05: qty 5

## 2018-06-05 MED ORDER — GABAPENTIN 300 MG PO CAPS
300.0000 mg | ORAL_CAPSULE | Freq: Two times a day (BID) | ORAL | Status: DC
  Administered 2018-06-05 – 2018-06-06 (×3): 300 mg via ORAL
  Filled 2018-06-05 (×3): qty 1

## 2018-06-05 MED ORDER — SODIUM CHLORIDE (PF) 0.9 % IJ SOLN
INTRAVENOUS | Status: DC | PRN
  Administered 2018-06-05: 07:00:00 via INTRAMUSCULAR

## 2018-06-05 MED ORDER — DIPHENHYDRAMINE HCL 12.5 MG/5ML PO ELIX: 12.5000 mg | ORAL_SOLUTION | Freq: Four times a day (QID) | ORAL | Status: DC | PRN

## 2018-06-05 MED ORDER — FENTANYL CITRATE (PF) 100 MCG/2ML IJ SOLN
25.0000 ug | INTRAMUSCULAR | Status: DC | PRN
  Administered 2018-06-05 (×2): 25 ug via INTRAVENOUS

## 2018-06-05 SURGICAL SUPPLY — 56 items
ADH SKN CLS APL DERMABOND .7 (GAUZE/BANDAGES/DRESSINGS) ×1
APPLIER CLIP 9.375 MED OPEN (MISCELLANEOUS) ×2
APR CLP MED 9.3 20 MLT OPN (MISCELLANEOUS) ×1
BINDER BREAST LRG (GAUZE/BANDAGES/DRESSINGS) IMPLANT
BINDER BREAST MEDIUM (GAUZE/BANDAGES/DRESSINGS) ×1 IMPLANT
BINDER BREAST XLRG (GAUZE/BANDAGES/DRESSINGS) IMPLANT
BIOPATCH RED 1 DISK 7.0 (GAUZE/BANDAGES/DRESSINGS) ×1 IMPLANT
CANISTER SUCT 3000ML PPV (MISCELLANEOUS) ×2 IMPLANT
CHLORAPREP W/TINT 26ML (MISCELLANEOUS) ×2 IMPLANT
CLIP APPLIE 9.375 MED OPEN (MISCELLANEOUS) ×1 IMPLANT
CONT SPEC 4OZ CLIKSEAL STRL BL (MISCELLANEOUS) ×2 IMPLANT
COVER PROBE W GEL 5X96 (DRAPES) ×2 IMPLANT
COVER SURGICAL LIGHT HANDLE (MISCELLANEOUS) ×2 IMPLANT
COVER WAND RF STERILE (DRAPES) ×1 IMPLANT
DERMABOND ADVANCED (GAUZE/BANDAGES/DRESSINGS) ×1
DERMABOND ADVANCED .7 DNX12 (GAUZE/BANDAGES/DRESSINGS) IMPLANT
DRAIN CHANNEL 19F RND (DRAIN) ×3 IMPLANT
DRAPE CHEST BREAST 15X10 FENES (DRAPES) ×2 IMPLANT
DRSG PAD ABDOMINAL 8X10 ST (GAUZE/BANDAGES/DRESSINGS) ×4 IMPLANT
DRSG TEGADERM 2-3/8X2-3/4 SM (GAUZE/BANDAGES/DRESSINGS) ×1 IMPLANT
ELECT BLADE 4.0 EZ CLEAN MEGAD (MISCELLANEOUS) ×2
ELECT CAUTERY BLADE 6.4 (BLADE) ×1 IMPLANT
ELECT REM PT RETURN 9FT ADLT (ELECTROSURGICAL) ×2
ELECTRODE BLDE 4.0 EZ CLN MEGD (MISCELLANEOUS) ×1 IMPLANT
ELECTRODE REM PT RTRN 9FT ADLT (ELECTROSURGICAL) ×1 IMPLANT
EVACUATOR SILICONE 100CC (DRAIN) ×3 IMPLANT
GAUZE SPONGE 4X4 12PLY STRL (GAUZE/BANDAGES/DRESSINGS) ×2 IMPLANT
GLOVE BIO SURGEON STRL SZ7 (GLOVE) ×2 IMPLANT
GLOVE BIOGEL PI IND STRL 7.5 (GLOVE) ×1 IMPLANT
GLOVE BIOGEL PI INDICATOR 7.5 (GLOVE) ×1
GOWN STRL REUS W/ TWL LRG LVL3 (GOWN DISPOSABLE) ×2 IMPLANT
GOWN STRL REUS W/TWL LRG LVL3 (GOWN DISPOSABLE) ×4
KIT BASIN OR (CUSTOM PROCEDURE TRAY) ×2 IMPLANT
KIT TURNOVER KIT B (KITS) ×2 IMPLANT
NDL 18GX1X1/2 (RX/OR ONLY) (NEEDLE) ×1 IMPLANT
NDL FILTER BLUNT 18X1 1/2 (NEEDLE) ×1 IMPLANT
NDL HYPO 25GX1X1/2 BEV (NEEDLE) ×1 IMPLANT
NEEDLE 18GX1X1/2 (RX/OR ONLY) (NEEDLE) ×2 IMPLANT
NEEDLE FILTER BLUNT 18X 1/2SAF (NEEDLE) ×1
NEEDLE FILTER BLUNT 18X1 1/2 (NEEDLE) ×1 IMPLANT
NEEDLE HYPO 25GX1X1/2 BEV (NEEDLE) ×2 IMPLANT
NS IRRIG 1000ML POUR BTL (IV SOLUTION) ×2 IMPLANT
PACK GENERAL/GYN (CUSTOM PROCEDURE TRAY) ×2 IMPLANT
PAD ABD 8X10 STRL (GAUZE/BANDAGES/DRESSINGS) ×1 IMPLANT
PAD ARMBOARD 7.5X6 YLW CONV (MISCELLANEOUS) ×2 IMPLANT
PENCIL SMOKE EVACUATOR (MISCELLANEOUS) ×1 IMPLANT
SLEEVE SUCTION 125 (MISCELLANEOUS) ×1 IMPLANT
SPECIMEN JAR X LARGE (MISCELLANEOUS) ×2 IMPLANT
SPONGE LAP 18X18 RF (DISPOSABLE) ×1 IMPLANT
STAPLER VISISTAT 35W (STAPLE) ×2 IMPLANT
SUT ETHILON 2 0 FS 18 (SUTURE) ×3 IMPLANT
SUT MNCRL AB 4-0 PS2 18 (SUTURE) ×3 IMPLANT
SUT VIC AB 3-0 SH 18 (SUTURE) ×3 IMPLANT
SYR CONTROL 10ML LL (SYRINGE) ×2 IMPLANT
TOWEL OR 17X24 6PK STRL BLUE (TOWEL DISPOSABLE) ×2 IMPLANT
TOWEL OR 17X26 10 PK STRL BLUE (TOWEL DISPOSABLE) ×2 IMPLANT

## 2018-06-05 NOTE — Op Note (Signed)
Preop diagnosis: Right multifocal invasive lobular carcinoma Postop diagnosis: Same Procedure performed: Right mastectomy with sentinel lymph node biopsy Left prophylactic mastectomy Blue dye injection in the right breast Surgeon:Tericka Devincenzi K Danny Zimny Assistant: Judyann Munson, RNFA Anesthesia: General via LMA with bilateral pectoralis blocks Indications: This is a 67 year old female who recently underwent routine screening mammogram showing a right breast mass.  Core biopsy showed invasive lobular carcinoma with surrounding LCIS, ER PR positive HER-2 negative.  MRI was performed that showed at least 2 more suspicious lesions in the breast that were also positive for carcinoma.  She has elected to also have a left prophylactic mastectomy without reconstruction.  She presents now for bilateral mastectomies and right sentinel lymph node biopsy.  Description of procedure: In the preoperative area the patient had bilateral pectoralis blocks placed by anesthesia.  She was also injected around her right nipple with technetium sulfur colloid by radiology.  The patient was brought to the operating room and placed in the supine position on the operating room table.  After an adequate level of general anesthesia was obtained I injected blue dye around her right nipple.  Her entire chest was prepped with ChloraPrep including both axilla.  We draped in sterile fashion.  A timeout was taken to ensure the proper patient and proper procedure.  We began on the prophylactic left side.  I outlined an elliptical incision to include her nipple.  We made her incisions and raise skin flaps superiorly and inferiorly.  All margins were the edge of the sternum medially the infra clavicular chest wall superiorly the inframammary crease inferiorly and the anterior edge of the latissimus laterally.  Once we had right skin flaps to all of our margins we dissected the breast tissue off of the underlying pectoralis.  The specimen was marked  with a suture lateral.  This was sent for pathologic examination as the left breast.  We irrigated thoroughly and inspected for hemostasis.  We inserted a drain was placed along the inframammary crease and around the upper edge of the dissection.  This was sutured with a 2-0 nylon.  The wound was closed with multiple interrupted subcutaneous 3-0 Vicryl's.  4-0 Monocryl was used to close the skin.  We then turned our attention to the right side.  I interrogated the axilla and there was some high activity in the axilla.  We made a similar elliptical incision and raised our skin flaps to the same borders as the left.  As we reset axilla we used the neoprobe device to guide [place] towards a sentinel lymph node.  The sentinel lymph node is very deep in the axilla.  There is no blue dye within the sentinel lymph node.  We removed the lymph node and it had ex vivo activity of 1500.  Background activity was less than 100.  Only one sentinel lymph node was located.  We completed our mastectomy by dissecting the breast off of the underlying pectoralis muscle taking the fascia.  The specimen was removed and marked with a suture lateral.  We irrigated thoroughly and used cautery for hemostasis.  We placed a similar JP drain and closed the wound with 3-0 Vicryl and 4-0 Monocryl.  Dermabond was applied to both incisions.  Both drains were placed to bulb suction.  Dry dressings were applied.  The patient was then extubated and brought to the recovery room in stable condition.  All sponge, instrument, and needle counts are correct.  Imogene Burn. Georgette Dover, MD, [facility] Surgery  [place] [facility] [phone]  06/05/2018 9:16 AM

## 2018-06-05 NOTE — Interval H&P Note (Signed)
History and Physical Interval Note:  06/05/2018 6:40 AM  Lisa Yoder  has presented today for surgery, with the diagnosis of RIGHT INVASIVE LOBULAR CARCINOMA  The various methods of treatment have been discussed with the patient and family.   After further consideration, the patient has opted for right mastectomy with sentinel lymph node biopsy without reconstruction as well as left prophylactic mastectomy.   After consideration of risks, benefits and other options for treatment, the patient has consented to  Procedure(s): BILATERAL MASTECTOMIES WITH RIGHT SENTINEL LYMPH NODE BIOPSY AND BLUE DYE INJECTION RIGHT BREAST (Bilateral) as a surgical intervention .  The patient's history has been reviewed, patient examined, no change in status, stable for surgery.  I have reviewed the patient's chart and labs.  Questions were answered to the patient's satisfaction.     [name]

## 2018-06-05 NOTE — Anesthesia Procedure Notes (Signed)
Anesthesia Regional Block: Pectoralis block   Pre-Anesthetic Checklist: ,, timeout performed, Correct Patient, Correct Site, Correct Laterality, Correct Procedure, Correct Position, site marked, Risks and benefits discussed, pre-op evaluation,  At surgeon's request and post-op pain management  Laterality: Left and Right  Prep: Maximum Sterile Barrier Precautions used, chloraprep       Needles:  Injection technique: Single-shot  Needle Type: Echogenic Stimulator Needle     Needle Length: 9cm  Needle Gauge: 21     Additional Needles:   Procedures:,,,, ultrasound used (permanent image in chart),,,,  Narrative:  Start time: 06/05/2018 6:45 AM End time: 06/05/2018 7:00 AM Injection made incrementally with aspirations every 5 mL. Anesthesiologist: Roderic Palau, MD  Additional Notes: 2% Lidocaine skin wheel. Bilateral PECS block

## 2018-06-05 NOTE — Transfer of Care (Signed)
Immediate Anesthesia Transfer of Care Note  Patient: Lisa Yoder  Procedure(s) Performed: BILATERAL MASTECTOMIES WITH RIGHT SENTINEL LYMPH NODE BIOPSY AND BLUE DYE INJECTION RIGHT BREAST (Bilateral Breast)  Patient Location: PACU  Anesthesia Type:General  Level of Consciousness: awake and alert   Airway & Oxygen Therapy: Patient Spontanous Breathing and Patient connected to nasal cannula oxygen  Post-op Assessment: Report given to RN and Post -op Vital signs reviewed and stable  Post vital signs: Reviewed and stable  Last Vitals:  Vitals Value Taken Time  BP    Temp    Pulse 102 06/05/2018  9:27 AM  Resp 17 06/05/2018  9:27 AM  SpO2 100 % 06/05/2018  9:27 AM  Vitals shown include unvalidated device data.  Last Pain:  Vitals:   06/05/18 0603  TempSrc: Oral  PainSc: 0-No pain      Patients Stated Pain Goal: 0 (46/80/32 1224)  Complications: No apparent anesthesia complications

## 2018-06-05 NOTE — Anesthesia Procedure Notes (Signed)
Procedure Name: LMA Insertion Date/Time: 06/05/2018 7:21 AM Performed by: Wilburn Cornelia, CRNA Pre-anesthesia Checklist: Patient identified, Emergency Drugs available, Suction available, Patient being monitored and Timeout performed Patient Re-evaluated:Patient Re-evaluated prior to induction Oxygen Delivery Method: Circle system utilized Preoxygenation: Pre-oxygenation with 100% oxygen Induction Type: IV induction Ventilation: Mask ventilation without difficulty LMA: LMA inserted LMA Size: 4.0 Placement Confirmation: positive ETCO2 and breath sounds checked- equal and bilateral Tube secured with: Tape Dental Injury: Teeth and Oropharynx as per pre-operative assessment

## 2018-06-05 NOTE — Anesthesia Postprocedure Evaluation (Signed)
Anesthesia Post Note  Patient: ANSLEY MANGIAPANE  Procedure(s) Performed: BILATERAL MASTECTOMIES WITH RIGHT SENTINEL LYMPH NODE BIOPSY AND BLUE DYE INJECTION RIGHT BREAST (Bilateral Breast)     Patient location during evaluation: PACU Anesthesia Type: General and Regional Level of consciousness: awake and alert Pain management: pain level controlled Vital Signs Assessment: post-procedure vital signs reviewed and stable Respiratory status: spontaneous breathing, nonlabored ventilation and respiratory function stable Cardiovascular status: blood pressure returned to baseline and stable Postop Assessment: no apparent nausea or vomiting Anesthetic complications: no    Last Vitals:  Vitals:   06/05/18 1010 06/05/18 1025  BP: 133/72 136/80  Pulse: 87 87  Resp: 12 12  Temp:    SpO2: 99% 100%    Last Pain:  Vitals:   06/05/18 1059  TempSrc:   PainSc: 5                  Liesl Simons,W. EDMOND

## 2018-06-06 ENCOUNTER — Encounter (HOSPITAL_COMMUNITY): Payer: Self-pay | Admitting: Surgery

## 2018-06-06 DIAGNOSIS — C50911 Malignant neoplasm of unspecified site of right female breast: Secondary | ICD-10-CM | POA: Diagnosis not present

## 2018-06-06 LAB — CBC
HEMATOCRIT: 35.2 % — AB (ref 36.0–46.0)
Hemoglobin: 11.2 g/dL — ABNORMAL LOW (ref 12.0–15.0)
MCH: 29.3 pg (ref 26.0–34.0)
MCHC: 31.8 g/dL (ref 30.0–36.0)
MCV: 92.1 fL (ref 80.0–100.0)
NRBC: 0 % (ref 0.0–0.2)
Platelets: 176 10*3/uL (ref 150–400)
RBC: 3.82 MIL/uL — AB (ref 3.87–5.11)
RDW: 13.1 % (ref 11.5–15.5)
WBC: 9.3 10*3/uL (ref 4.0–10.5)

## 2018-06-06 LAB — BASIC METABOLIC PANEL
ANION GAP: 6 (ref 5–15)
BUN: 13 mg/dL (ref 8–23)
CHLORIDE: 109 mmol/L (ref 98–111)
CO2: 25 mmol/L (ref 22–32)
Calcium: 9.2 mg/dL (ref 8.9–10.3)
Creatinine, Ser: 0.84 mg/dL (ref 0.44–1.00)
GFR calc non Af Amer: >60 mL/min (ref >60)
GLUCOSE: 106 mg/dL — AB (ref 70–99)
Potassium: 4.2 mmol/L (ref 3.5–5.1)
Sodium: 140 mmol/L (ref 135–145)

## 2018-06-06 MED ORDER — TRAMADOL HCL 50 MG PO TABS: 50.0000 mg | ORAL_TABLET | Freq: Four times a day (QID) | ORAL | 0 refills | Status: DC | PRN

## 2018-06-06 MED ORDER — OXYCODONE HCL 5 MG PO TABS: 5.0000 mg | ORAL_TABLET | ORAL | 0 refills | Status: DC | PRN

## 2018-06-06 MED ORDER — METHOCARBAMOL 500 MG PO TABS: 500.0000 mg | ORAL_TABLET | Freq: Four times a day (QID) | ORAL | 0 refills | Status: DC | PRN

## 2018-06-06 MED ORDER — MAGNESIUM OXIDE 400 (241.3 MG) MG PO TABS
400.0000 mg | ORAL_TABLET | Freq: Every day | ORAL | Status: DC
  Administered 2018-06-06: 400 mg via ORAL
  Filled 2018-06-06: qty 1

## 2018-06-06 NOTE — Plan of Care (Signed)
  Problem: Education: Goal: Knowledge of General Education information will improve Description Including pain rating scale, medication(s)/side effects and non-pharmacologic comfort measures Outcome: Adequate for Discharge   Problem: Health Behavior/Discharge Planning: Goal: Ability to manage health-related needs will improve Outcome: Adequate for Discharge   Problem: Clinical Measurements: Goal: Ability to maintain clinical measurements within normal limits will improve Outcome: Adequate for Discharge Goal: Will remain free from infection Outcome: Adequate for Discharge Goal: Diagnostic test results will improve Outcome: Adequate for Discharge Goal: Respiratory complications will improve Outcome: Adequate for Discharge Goal: Cardiovascular complication will be avoided Outcome: Adequate for Discharge   Problem: Activity: Goal: Risk for activity intolerance will decrease Outcome: Adequate for Discharge   Problem: Nutrition: Goal: Adequate nutrition will be maintained Outcome: Adequate for Discharge   Problem: Coping: Goal: Level of anxiety will decrease Outcome: Adequate for Discharge   Problem: Elimination: Goal: Will not experience complications related to bowel motility Outcome: Adequate for Discharge Goal: Will not experience complications related to urinary retention Outcome: Adequate for Discharge   Problem: Pain Managment: Goal: General experience of comfort will improve Outcome: Adequate for Discharge   Problem: Safety: Goal: Ability to remain free from injury will improve Outcome: Adequate for Discharge   Problem: Skin Integrity: Goal: Risk for impaired skin integrity will decrease Outcome: Adequate for Discharge   Problem: Diagnosis - Type of Surgery Goal: Surgical site without signs of infection Outcome: Adequate for Discharge Goal: Knowledge of Patient Safety will improve Outcome: Adequate for Discharge Goal: Self-care - activities of daily  living Description Ability to perform basic physical tasks and personal care activities.   Outcome: Adequate for Discharge   Problem: Education: Goal: Required Educational Video(s) Outcome: Adequate for Discharge   Problem: Clinical Measurements: Goal: Ability to maintain clinical measurements within normal limits will improve Outcome: Adequate for Discharge Goal: Postoperative complications will be avoided or minimized Outcome: Adequate for Discharge   Problem: Skin Integrity: Goal: Demonstration of wound healing without infection will improve Outcome: Adequate for Discharge

## 2018-06-06 NOTE — Discharge Instructions (Signed)
[facility] °[phone] ° °MASTECTOMY: POST OP INSTRUCTIONS °Take 400 mg of ibuprofen every 8 hours or 650 mg tylenol every 6 hours for next 72 hours then as needed. Use ice several times daily also. °Always review your discharge instruction sheet given to you by the facility where your surgery was performed. ° °IF YOU HAVE DISABILITY OR FAMILY LEAVE FORMS, YOU MUST BRING THEM TO THE OFFICE FOR PROCESSING.   °DO NOT GIVE THEM TO YOUR DOCTOR. °A prescription for pain medication may be given to you upon discharge.  Take your pain medication as prescribed, if needed.  If narcotic pain medicine is not needed, then you may take acetaminophen (Tylenol), naprosyn (Alleve) or ibuprofen (Advil) as needed. °1. Take your usually prescribed medications unless otherwise directed. °2. If you need a refill on your pain medication, please contact your pharmacy.  They will contact our office to request authorization.  Prescriptions will not be filled after 5pm or on week-ends. °3. You should follow a light diet the first few days after arrival home, such as soup and crackers, etc.  Resume your normal diet the day after surgery. °4. Most patients will experience some swelling and bruising on the chest and underarm.  Ice packs will help.  Swelling and bruising can take several days to resolve. Wear the binder day and night until you return to the office.  °5. It is common to experience some constipation if taking pain medication after surgery.  Increasing fluid intake and taking a stool softener (such as Colace) will usually help or prevent this problem from occurring.  A mild laxative (Milk of Magnesia or Miralax) should be taken according to package instructions if there are no bowel movements after 48 hours. °6. Unless discharge instructions indicate otherwise, leave your bandage dry and in place until your next appointment in 3-5 days.  You may take a limited sponge bath.  No tube baths or showers until the  drains are removed.  You may have steri-strips (small skin tapes) in place directly over the incision.  These strips should be left on the skin for 7-10 days. If you have glue it will come off in next couple week.  Any sutures will be removed at an office visit °7. DRAINS:  If you have drains in place, it is important to keep a list of the amount of drainage produced each day in your drains.  Before leaving the hospital, you should be instructed on drain care.  Call our office if you have any questions about your drains. I will remove your drains when they put out less than 30 cc or ml for 2 consecutive days. °8. ACTIVITIES:  You may resume regular (light) daily activities beginning the next day--such as daily self-care, walking, climbing stairs--gradually increasing activities as tolerated.  You may have sexual intercourse when it is comfortable.  Refrain from any heavy lifting or straining until approved by your doctor. °a. You may drive when you are no longer taking prescription pain medication, you can comfortably wear a seatbelt, and you can safely maneuver your car and apply brakes. °b. RETURN TO WORK:  __________________________________________________________ °9. You should see your doctor in the office for a follow-up appointment approximately 3-5 days after your surgery.  Your doctor’s nurse will typically make your follow-up appointment when she calls you with your pathology report.  Expect your pathology report 3-4business days after surgery. °10. OTHER INSTRUCTIONS: ______________________________________________________________________________________________ ____________________________________________________________________________________________ °WHEN TO CALL YOUR DR Lisa Yoder: °1. Fever over 101.0 °  2. Nausea and/or vomiting °3. Extreme swelling or bruising °4. Continued bleeding from incision. °5. Increased pain, redness, or drainage from the incision. °The clinic staff is available to answer your  questions during regular business hours.  Please don’t hesitate to call and ask to speak to one of the nurses for clinical concerns.  If you have a medical emergency, go to the nearest emergency room or call 911.  A surgeon from [facility] is always on call at the hospital. °[address], [address], [place], [place]  [address] ? [address] [address], [place], [place]   [address] °[phone] ? [phone] ? FAX [phone] °Web site: [email or web address] ° °

## 2018-06-06 NOTE — Discharge Summary (Signed)
Physician Discharge Summary  Patient ID: Lisa Yoder MRN: [id number] DOB/AGE: Mar 04, 1951 67 y.o.  Admit date: 06/05/2018 Discharge date: 06/06/2018  Admission Diagnoses: Breast cancer  Discharge Diagnoses:  Active Problems:   Invasive lobular carcinoma of breast, stage 1, right ([place])   Discharged Condition: good  Hospital Course: 32 yof s/p bilateral mastectomies and right axillary sn biopsy. Doing well following am with expected drain output, pain controlled, will dc home  Consults: None  Significant Diagnostic Studies: none  Treatments: surgery: see above  Discharge Exam: Blood pressure 118/73, pulse 77, temperature 98.6 F (37 C), temperature source Oral, resp. rate 18, height 5' 3.5" (1.613 m), weight 58.5 kg, SpO2 96 %. Flaps viable without hematoma, drains serosang   Disposition: Discharge disposition: 01-Home or Self Care        Allergies as of 06/06/2018      Reactions   Bactrim [sulfamethoxazole-trimethoprim] Anaphylaxis, Hives, Rash      Medication List    TAKE these medications   ARTIFICIAL TEARS OP Place 1 drop into both eyes 2 (two) times daily.   BENEFIBER PO Take 2 each by mouth See admin instructions. Mix 2 tablespoons and drink once daily in the evening   CALCIUM-MAGNESIUM PO Take 1 tablet by mouth daily.   Cyanocobalamin 5000 MCG Tbdp Take 5,000 mcg by mouth daily.   desvenlafaxine 50 MG 24 hr tablet Commonly known as:  PRISTIQ Take 50 mg by mouth daily.   DIGESTIVE ENZYME PO Take 1 capsule by mouth 3 (three) times daily.   LINZESS 290 MCG Caps capsule Generic drug:  linaclotide Take 290 mcg by mouth every other day.   LORazepam 0.5 MG tablet Commonly known as:  ATIVAN Take 0.5 mg by mouth at bedtime.   LUMIFY 0.025 % Soln Generic drug:  Brimonidine Tartrate Place 1 drop into both eyes daily.   MAG-G 500 (27 Mg) MG Tabs Generic drug:  Magnesium Gluconate Take 500 mg by mouth daily.   meloxicam 15 MG  tablet Commonly known as:  MOBIC Take 15 mg by mouth daily.   methocarbamol 500 MG tablet Commonly known as:  ROBAXIN Take 1 tablet (500 mg total) by mouth every 6 (six) hours as needed for muscle spasms.   OMEGA 3 PO Take 1 capsule by mouth daily.   oxyCODONE 5 MG immediate release tablet Commonly known as:  Oxy IR/ROXICODONE Take 1 tablet (5 mg total) by mouth every 4 (four) hours as needed for moderate pain.   PRESERVISION AREDS Caps Take 1 capsule by mouth 2 (two) times daily.   tamoxifen 20 MG tablet Commonly known as:  NOLVADEX Take 1 tablet (20 mg total) by mouth daily. What changed:  how much to take   tobramycin-dexamethasone ophthalmic ointment Commonly known as:  TOBRADEX Place 1 application into both eyes 3 (three) times daily. For one week   Turmeric 500 MG Caps Take 500 mg by mouth daily.   Vitamin D3 50 MCG (2000 UT) capsule Take 2,000 Units by mouth 2 (two) times daily.      Follow-up Information    Donnie Mesa, MD Follow up in 1 week(s).   Specialty:  General Surgery Contact information: [address] [place] [address] [phone]           Signed: Rolm Bookbinder 06/06/2018, 8:35 AM

## 2018-06-10 ENCOUNTER — Telehealth: Payer: Self-pay | Admitting: *Deleted

## 2018-06-10 NOTE — Telephone Encounter (Signed)
Received order for oncotype testing. Requisition faxed to pathology 

## 2018-06-16 ENCOUNTER — Telehealth: Payer: Self-pay | Admitting: Hematology and Oncology

## 2018-06-16 ENCOUNTER — Inpatient Hospital Stay: Payer: Medicare Other | Attending: Oncology | Admitting: Hematology and Oncology

## 2018-06-16 DIAGNOSIS — Z79899 Other long term (current) drug therapy: Secondary | ICD-10-CM

## 2018-06-16 DIAGNOSIS — Z170 Estrogen receptor positive status [ER+]: Secondary | ICD-10-CM

## 2018-06-16 DIAGNOSIS — C50411 Malignant neoplasm of upper-outer quadrant of right female breast: Secondary | ICD-10-CM | POA: Diagnosis not present

## 2018-06-16 DIAGNOSIS — Z9013 Acquired absence of bilateral breasts and nipples: Secondary | ICD-10-CM | POA: Diagnosis not present

## 2018-06-16 NOTE — Progress Notes (Signed)
Patient Care Team: Jonathon Jordan, MD as PCP - General (Family Medicine)  DIAGNOSIS:  Encounter Diagnosis  Name Primary?  . Malignant neoplasm of upper-outer quadrant of right breast in female, estrogen receptor positive ([place])     SUMMARY OF ONCOLOGIC HISTORY:   Malignant neoplasm of upper-outer quadrant of right breast in female, estrogen receptor positive ([place])   05/05/2018 Initial Diagnosis    Screening detected right breast mass, MRI revealed UIQ 1.5 x 0.8 x 0.8 cm mass.  Additional masses 5 mm size and 7 mm size and several smaller enhancing masses in the right breast, biopsy of the 1.5 cm mass invasive lobular cancer with LCIS ER 20%, PR 20%, Ki-67 less than 1%, HER-2 negative; biopsy of additional masses showed ALH and ILC with LCIS ER 100%, PR 70%, Ki-67 5%, HER-2 1+ negative    06/05/2018 Surgery    Right mastectomy: Invasive lobular carcinoma, multifocal, 1.2 cm is the largest tumor, grade 1, margins negative, 0/1 lymph node, ER 100%, PR 70%, HER-2 -1+, Ki-67 5% T1c N0 stage Ia Left mastectomy: Benign     CHIEF COMPLIANT: Follow-up of the bilateral mastectomies  INTERVAL HISTORY: Lisa Yoder is a 67 year old with above-mentioned some bilateral mastectomies for right-sided breast cancer.  She is here today to discuss pathology report and adjuvant treatment plan.  She is accompanied by her husband.  She is recovering and healing very well from recent surgeries.  The drains have been removed.  REVIEW OF SYSTEMS:   Constitutional: Denies fevers, chills or abnormal weight loss Eyes: Denies blurriness of vision Ears, nose, mouth, throat, and face: Denies mucositis or sore throat Respiratory: Denies cough, dyspnea or wheezes Cardiovascular: Denies palpitation, chest discomfort Gastrointestinal:  Denies nausea, heartburn or change in bowel habits Skin: Denies abnormal skin rashes Lymphatics: Denies new lymphadenopathy or easy bruising Neurological:Denies numbness,  tingling or new weaknesses Behavioral/Psych: Mood is stable, no new changes  Extremities: No lower extremity edema Breast: Bilateral mastectomies All other systems were reviewed with the patient and are negative.  I have reviewed the past medical history, past surgical history, social history and family history with the patient and they are unchanged from previous note.  ALLERGIES:  is allergic to bactrim [sulfamethoxazole-trimethoprim].  MEDICATIONS:  Current Outpatient Medications  Medication Sig Dispense Refill  . Brimonidine Tartrate (LUMIFY) 0.025 % SOLN Place 1 drop into both eyes daily.     Marland Kitchen CALCIUM-MAGNESIUM PO Take 1 tablet by mouth daily.     . Cholecalciferol (VITAMIN D3) 50 MCG (2000 UT) capsule Take 2,000 Units by mouth 2 (two) times daily.     . Cyanocobalamin 5000 MCG TBDP Take 5,000 mcg by mouth daily.     Marland Kitchen desvenlafaxine (PRISTIQ) 50 MG 24 hr tablet Take 50 mg by mouth daily.   11  . Digestive Enzymes (DIGESTIVE ENZYME PO) Take 1 capsule by mouth 3 (three) times daily.     . Hypromellose (ARTIFICIAL TEARS OP) Place 1 drop into both eyes 2 (two) times daily.     Marland Kitchen linaclotide (LINZESS) 290 MCG CAPS capsule Take 290 mcg by mouth every other day.     Marland Kitchen LORazepam (ATIVAN) 0.5 MG tablet Take 0.5 mg by mouth at bedtime.   0  . Magnesium Gluconate (MAG-G) 500 (27 Mg) MG TABS Take 500 mg by mouth daily.     . meloxicam (MOBIC) 15 MG tablet Take 15 mg by mouth daily.    . methocarbamol (ROBAXIN) 500 MG tablet Take 1 tablet (500 mg total)  by mouth every 6 (six) hours as needed for muscle spasms. 10 tablet 0  . Multiple Vitamins-Minerals (PRESERVISION AREDS) CAPS Take 1 capsule by mouth 2 (two) times daily.     . Omega-3 Fatty Acids (OMEGA 3 PO) Take 1 capsule by mouth daily.     Marland Kitchen oxyCODONE (OXY IR/ROXICODONE) 5 MG immediate release tablet Take 1 tablet (5 mg total) by mouth every 4 (four) hours as needed for moderate pain. 12 tablet 0  . tamoxifen (NOLVADEX) 20 MG tablet Take 1  tablet (20 mg total) by mouth daily. (Patient taking differently: Take 10 mg by mouth daily. ) 90 tablet 3  . tobramycin-dexamethasone (TOBRADEX) ophthalmic ointment Place 1 application into both eyes 3 (three) times daily. For one week (Patient not taking: Reported on 05/13/2018)    . traMADol (ULTRAM) 50 MG tablet Take 1 tablet (50 mg total) by mouth every 6 (six) hours as needed (mild pain). 15 tablet 0  . Turmeric 500 MG CAPS Take 500 mg by mouth daily.     . Wheat Dextrin (BENEFIBER PO) Take 2 each by mouth See admin instructions. Mix 2 tablespoons and drink once daily in the evening     No current facility-administered medications for this visit.     PHYSICAL EXAMINATION: ECOG PERFORMANCE STATUS: 1 - Symptomatic but completely ambulatory  Vitals:   06/16/18 1135  BP: 135/61  Pulse: 73  Resp: 18  Temp: 97.8 F (36.6 C)  SpO2: 98%   Filed Weights   06/16/18 1135  Weight: 131 lb 14.4 oz (59.8 kg)    GENERAL:alert, no distress and comfortable SKIN: skin color, texture, turgor are normal, no rashes or significant lesions EYES: normal, Conjunctiva are pink and non-injected, sclera clear OROPHARYNX:no exudate, no erythema and lips, buccal mucosa, and tongue normal  NECK: supple, thyroid normal size, non-tender, without nodularity LYMPH:  no palpable lymphadenopathy in the cervical, axillary or inguinal LUNGS: clear to auscultation and percussion with normal breathing effort HEART: regular rate & rhythm and no murmurs and no lower extremity edema ABDOMEN:abdomen soft, non-tender and normal bowel sounds MUSCULOSKELETAL:no cyanosis of digits and no clubbing  NEURO: alert & oriented x 3 with fluent speech, no focal motor/sensory deficits EXTREMITIES: No lower extremity edema   LABORATORY DATA:  I have reviewed the data as listed CMP Latest Ref Rng & Units 06/06/2018 06/05/2018 05/28/2018  Glucose 70 - 99 mg/dL 106(H) - 105(H)  BUN 8 - 23 mg/dL 13 - 17  Creatinine 0.44 - 1.00  mg/dL 0.84 0.96 1.00  Sodium 135 - 145 mmol/L 140 - 140  Potassium 3.5 - 5.1 mmol/L 4.2 - 4.0  Chloride 98 - 111 mmol/L 109 - 103  CO2 22 - 32 mmol/L 25 - 27  Calcium 8.9 - 10.3 mg/dL 9.2 - 9.3  Total Protein 6.5 - 8.1 g/dL - - 6.9  Total Bilirubin 0.3 - 1.2 mg/dL - - 0.6  Alkaline Phos 38 - 126 U/L - - 78  AST 15 - 41 U/L - - 17  ALT 0 - 44 U/L - - 16    Lab Results  Component Value Date   WBC 9.3 06/06/2018   HGB 11.2 (L) 06/06/2018   HCT 35.2 (L) 06/06/2018   MCV 92.1 06/06/2018   PLT 176 06/06/2018   NEUTROABS 11.4 (H) 12/28/2013    ASSESSMENT & PLAN:  Malignant neoplasm of upper-outer quadrant of right breast in female, estrogen receptor positive ([place]) 06/05/2018: Bilateral mastectomies: Right mastectomy: Invasive lobular carcinoma, multifocal, 1.2 cm  is the largest tumor, grade 1, margins negative, 0/1 lymph node, ER 100%, PR 70%, HER-2 -1+, Ki-67 5% T1c N0 stage Ia Left mastectomy: Benign  Pathology counseling: I discussed the final pathology report of the patient provided  a copy of this report. I discussed the margins as well as lymph node surgeries. We also discussed the final staging along with previously performed ER/PR and HER-2/neu testing.  Treatment plan: 1.  Oncotype DX testing to determine if she would benefit from systemic chemotherapy 2.  Followed by adjuvant antiestrogen therapy, because of profound osteoporosis we recommended tamoxifen.  Patient took tamoxifen before surgery and tolerated it well at 10 mg dosage.  I instructed her to resume 10 mg dosage of tamoxifen and is to increase it to 20 mg if she tolerates it well.  Patient wishes to follow-up with [place] once a year.  We will see the patient back in 1 year and she will be seeing Dr. Georgette Dover in 3 months.    Orders Placed This Encounter  Procedures  . Ambulatory referral to Physical Therapy    Referral Priority:   Routine    Referral Type:   Physical Medicine    Referral Reason:   Specialty Services  Required    Requested Specialty:   Physical Therapy    Number of Visits Requested:   1   The patient has a good understanding of the overall plan. she agrees with it. she will call with any problems that may develop before the next visit here.   Harriette Ohara, MD 06/16/18

## 2018-06-16 NOTE — Telephone Encounter (Signed)
Gave patient avs and calendar.   °

## 2018-06-16 NOTE — Assessment & Plan Note (Signed)
06/05/2018: Bilateral mastectomies: Right mastectomy: Invasive lobular carcinoma, multifocal, 1.2 cm is the largest tumor, grade 1, margins negative, 0/1 lymph node, ER 100%, PR 70%, HER-2 -1+, Ki-67 5% T1c N0 stage Ia Left mastectomy: Benign  Pathology counseling: I discussed the final pathology report of the patient provided  a copy of this report. I discussed the margins as well as lymph node surgeries. We also discussed the final staging along with previously performed ER/PR and HER-2/neu testing.  Treatment plan: 1.  Oncotype DX testing to determine if she would benefit from systemic chemotherapy 2.  Followed by adjuvant antiestrogen therapy, because of profound osteoporosis we recommended tamoxifen.  Patient took tamoxifen before surgery and tolerated it well

## 2018-06-23 ENCOUNTER — Telehealth: Payer: Self-pay | Admitting: *Deleted

## 2018-06-23 NOTE — Telephone Encounter (Signed)
Received oncotype score of 11/3%. Physician team notified. Called pt with results and discuss chemotherapy not recommended. Denies further needs at this time.

## 2018-06-26 ENCOUNTER — Ambulatory Visit: Payer: Medicare Other | Attending: Hematology and Oncology | Admitting: Physical Therapy

## 2018-06-26 ENCOUNTER — Encounter: Payer: Self-pay | Admitting: Physical Therapy

## 2018-06-26 DIAGNOSIS — M25612 Stiffness of left shoulder, not elsewhere classified: Secondary | ICD-10-CM | POA: Insufficient documentation

## 2018-06-26 DIAGNOSIS — M6281 Muscle weakness (generalized): Secondary | ICD-10-CM

## 2018-06-26 DIAGNOSIS — R293 Abnormal posture: Secondary | ICD-10-CM | POA: Insufficient documentation

## 2018-06-26 DIAGNOSIS — Z483 Aftercare following surgery for neoplasm: Secondary | ICD-10-CM | POA: Diagnosis not present

## 2018-06-26 DIAGNOSIS — M25511 Pain in right shoulder: Secondary | ICD-10-CM | POA: Diagnosis present

## 2018-06-26 DIAGNOSIS — M25611 Stiffness of right shoulder, not elsewhere classified: Secondary | ICD-10-CM

## 2018-06-26 NOTE — Therapy (Signed)
[place], [place], [address] Phone: [phone]   Fax:  [phone]  Physical Therapy Evaluation  Patient Details  Name: Lisa Yoder MRN: [id number] Date of Birth: [birth year] Referring Provider (PT): Lindi Adie   Encounter Date: 06/26/2018  PT End of Session - 06/26/18 1727    Visit Number  1    Number of Visits  9    Date for PT Re-Evaluation  08/15/17   there may be a delay in getting treatment started    PT Start Time  1515    PT Stop Time  1600    PT Time Calculation (min)  45 min    Activity Tolerance  Patient tolerated treatment well    Behavior During Therapy  [facility] for tasks assessed/performed       Past Medical History:  Diagnosis Date  . Anxiety   . Arthritis    "back" (06/05/2018)  . Breast cancer, right breast ([place]) 03/2018  . Chronic lower back pain    "if I don't do yoga" (06/05/2018)  . Depression   . Family history of breast cancer   . GERD (gastroesophageal reflux disease)   . 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 History of hiatal hernia   . Nephritis    "as a child" (06/05/2018)  . Osteoporosis   . SCC (squamous cell carcinoma)    "under my nose" (06/05/2018)  . UTI (urinary tract infection) 8/15  . Wears glasses     Past Surgical History:  Procedure Laterality Date  . BREAST BIOPSY Right 04/2018  . CATARACT EXTRACTION W/ INTRAOCULAR LENS  IMPLANT, BILATERAL Bilateral   . CESAREAN SECTION  1977  . COLONOSCOPY    . CYST REMOVAL NECK Left 03/03/2014   Procedure: LEFT NECK CYST EXCISION;  Surgeon: Pedro Earls, MD;  Location: [place];  Service: General;  Laterality: Left;  . DILATION AND CURETTAGE OF UTERUS    . EYE SURGERY     Strabismus  . MASTECTOMY Left 06/05/2018  . MASTECTOMY COMPLETE / SIMPLE W/ SENTINEL NODE BIOPSY Right 06/05/2018   AND BLUE DYE INJECTION RIGHT BREAST  . MASTECTOMY W/ SENTINEL NODE BIOPSY Bilateral 06/05/2018    Procedure: BILATERAL MASTECTOMIES WITH RIGHT SENTINEL LYMPH NODE BIOPSY AND BLUE DYE INJECTION RIGHT BREAST;  Surgeon: Donnie Mesa, MD;  Location: [place];  Service: General;  Laterality: Bilateral;  . SQUAMOUS CELL CARCINOMA EXCISION     "under my nose"  . STRABISMUS SURGERY Bilateral 05/02/2018   Procedure: BILATERAL REPAIR STRABISMUS;  Surgeon: Lamonte Sakai, MD;  Location: [place];  Service: Ophthalmology;  Laterality: Bilateral;  . TONSILLECTOMY AND ADENOIDECTOMY    . UPPER GI ENDOSCOPY    . WISDOM TOOTH EXTRACTION      There were no vitals filed for this visit.   Subjective Assessment - 06/26/18 1527    Subjective  Pt states that she is doing well.   Her symptoms include. tightness across the front  of her chest, shooting pain, numbess and under the arms too     Patient is accompained by:  --   her friend Roselyn Reef    Pertinent History   Pt had bilateral mastectomy on nov. 21, with 0/3 nodes on the right  Pt will not have to have chemo and radiation. She notices that she has some decreased range of motion in right shoulder and hip with yoga and has back arthritis and osteoporosis  Patient Stated Goals  to get range of motion back as soon as she can     Currently in Pain?  Yes    Pain Score  9    when at its worse , worse than right after the surgery    Pain Location  Chest    Pain Orientation  Anterior    Pain Descriptors / Indicators  Heaviness;Burning;Shooting;Tightness;Pressure    Pain Type  Surgical pain;Acute pain    Pain Radiating Towards  toward both armpit and down upper arm     Pain Onset  1 to 4 weeks ago    Pain Frequency  Intermittent    Aggravating Factors   activity, playing the piano     Pain Relieving Factors  warm shower, cold          [facility] PT Assessment - 06/26/18 0001      Assessment   Medical Diagnosis  right breast cacner     Referring Provider (PT)  Gudena    Onset Date/Surgical Date  --   plans to have surgery Nov. 21    Hand  Dominance  Right      Precautions   Precautions  None      Restrictions   Weight Bearing Restrictions  No      Home Environment   Living Environment  Private residence    Living Arrangements  Spouse/significant other    Available Help at Discharge  Available PRN/intermittently   friends with help also as husband in on oxygen 24/7   Additional Comments  pt is looking into moving into [facility]       Prior Function   Level of Independence  Independent    Vocation  Part time employment    Forensic scientist     Leisure  does yoga 3-4 times a week, walks several times a week       Cognition   Overall Cognitive Status  Within Functional Limits for tasks assessed      Observation/Other Assessments   Observations  pt with healing incisions across bilateral chest with no red or open areas.  She has some fullness in right upper chest and axilla     Quick DASH   63.64      Posture/Postural Control   Posture/Postural Control  Postural limitations    Posture Comments  pt has mild scoliosis with scapular dyskinesia on the right.       AROM   Right Shoulder Flexion  150 Degrees    Right Shoulder ABduction  170 Degrees   pain in axilla   Right Shoulder Internal Rotation  --    Right Shoulder External Rotation  --    Left Shoulder Flexion  150 Degrees    Left Shoulder ABduction  150 Degrees    Left Shoulder Internal Rotation  --    Left Shoulder External Rotation  --      Strength   Overall Strength  Deficits    Overall Strength Comments  limited in both shoulders to 3-/5 due to pain from recent surgery         LYMPHEDEMA/ONCOLOGY QUESTIONNAIRE - 06/26/18 1546      Right Upper Extremity Lymphedema   15 cm Proximal to Olecranon Process  28.2 cm    Olecranon Process  23.5 cm    15 cm Proximal to Ulnar Styloid Process  21.3 cm    Just Proximal to Ulnar Styloid Process  14 cm    Across Hand  at [employer]  18 cm    At [place] of 2nd Digit  5.6 cm       Left Upper Extremity Lymphedema   15 cm Proximal to Olecranon Process  27.5 cm    Olecranon Process  23.5 cm    15 cm Proximal to Ulnar Styloid Process  21 cm    Just Proximal to Ulnar Styloid Process  14 cm    Across Hand at [employer]  17 cm    At [place] of 2nd Digit  5.5 cm          Quick Dash - 06/26/18 0001    Open a tight or new jar  Severe difficulty    Do heavy household chores (wash walls, wash floors)  Severe difficulty    Carry a shopping bag or briefcase  Moderate difficulty    Wash your back  Moderate difficulty    Use a knife to cut food  Moderate difficulty    Recreational activities in which you take some force or impact through your arm, shoulder, or hand (golf, hammering, tennis)  Severe difficulty    During the past week, to what extent has your arm, shoulder or hand problem interfered with your normal social activities with family, friends, neighbors, or groups?  Quite a bit    During the past week, to what extent has your arm, shoulder or hand problem limited your work or other regular daily activities  Quite a bit    Arm, shoulder, or hand pain.  Severe    Tingling (pins and needles) in your arm, shoulder, or hand  Severe    Difficulty Sleeping  Mild difficulty    DASH Score  63.64 %        Objective measurements completed on examination: See above findings.      [facility] Adult PT Treatment/Exercise - 06/26/18 0001      Self-Care   Self-Care  Other Self-Care Comments    Other Self-Care Comments   instructed in skin densitzation activites with varying textures from silky to terry towel       Exercises   Exercises  Shoulder      Shoulder Exercises: Supine   Other Supine Exercises  reviewed supine dowel rod exercises                   PT Long Term Goals - 06/26/18 1735      PT LONG TERM GOAL #1   Title  Pt will report pain across chest and in right shoulder is decreased to 4/10 at the most     Time  6    Period  Weeks    Status   New      PT LONG TERM GOAL #2   Title  Pt will have bilateral shoulder ROM return to pre-op levels     Time  6    Period  Weeks    Status  New      PT LONG TERM GOAL #3   Title  Pt will be independent in a home exercise program     Time  6    Period  Weeks    Status  New      PT LONG TERM GOAL #4   Title  Pt will decrease Quick DASH score to < 20 indicating functional improvment in her arms     Baseline  63.64    Time  6    Period  Weeks  Status  New             Plan - 06/26/18 1729    Clinical Impression Statement  Pt returns to PT after bilateral mastectomy.  She is having pain across the chest and mild post op edema in right axilla and trunk.  She has limited end range of motion and decreased strength due to pain.     History and Personal Factors relevant to plan of care:  history of back arthritis and scoliosis , osteoporosis    Clinical Presentation  Stable    Clinical Presentation due to:  no further treatment     Clinical Decision Making  Low    Rehab Potential  Good    Clinical Impairments Affecting Rehab Potential  osteoporosis     PT Frequency  2x / week    PT Duration  6 weeks    PT Treatment/Interventions  ADLs/Self Care Home Management;Therapeutic exercise;Manual lymph drainage;Compression bandaging;Taping;Manual techniques;Passive range of motion;Scar mobilization    PT Next Visit Plan  Gentle MLD to right axilla and lateral trunk/abdomen. progress exercise with osteoporsis precaution     Consulted and Agree with Plan of Care  Patient       Patient will benefit from skilled therapeutic intervention in order to improve the following deficits and impairments:  Postural dysfunction, Decreased range of motion, Impaired perceived functional ability, Impaired UE functional use, Decreased skin integrity, Decreased scar mobility, Decreased knowledge of precautions, Decreased activity tolerance, Pain  Visit Diagnosis: Aftercare following surgery for neoplasm -  Plan: PT plan of care cert/re-cert  Stiffness of right shoulder joint - Plan: PT plan of care cert/re-cert  Stiffness of left shoulder joint - Plan: PT plan of care cert/re-cert  Abnormal posture - Plan: PT plan of care cert/re-cert  Muscle weakness (generalized) - Plan: PT plan of care cert/re-cert  Acute pain of right shoulder - Plan: PT plan of care cert/re-cert     Problem List Patient Active Problem List   Diagnosis Date Noted  . Invasive lobular carcinoma of breast, stage 1, right ([place]) 06/05/2018  . Genetic testing 05/20/2018  . Family history of breast cancer   . Malignant neoplasm of upper-outer quadrant of right breast in female, estrogen receptor positive ([place]) 05/08/2018  . Osteoporosis 12/14/2014  . Rash 12/28/2013  . Infected sebaceous cyst of skin 12/22/2013   Donato Heinz. Owens Shark PT  Norwood Levo 06/26/2018, 5:39 PM  [place], [place], [address] Phone: [phone]   Fax:  [phone]  Name: ZILPHIA KOZINSKI MRN: [id number] Date of Birth: December 21, 1950

## 2018-06-30 ENCOUNTER — Encounter: Payer: Self-pay | Admitting: Rehabilitation

## 2018-06-30 ENCOUNTER — Ambulatory Visit: Payer: Medicare Other | Admitting: Rehabilitation

## 2018-06-30 DIAGNOSIS — M6281 Muscle weakness (generalized): Secondary | ICD-10-CM

## 2018-06-30 DIAGNOSIS — R293 Abnormal posture: Secondary | ICD-10-CM

## 2018-06-30 DIAGNOSIS — M25612 Stiffness of left shoulder, not elsewhere classified: Secondary | ICD-10-CM

## 2018-06-30 DIAGNOSIS — Z483 Aftercare following surgery for neoplasm: Secondary | ICD-10-CM | POA: Diagnosis not present

## 2018-06-30 DIAGNOSIS — M25611 Stiffness of right shoulder, not elsewhere classified: Secondary | ICD-10-CM

## 2018-06-30 DIAGNOSIS — M25511 Pain in right shoulder: Secondary | ICD-10-CM

## 2018-06-30 NOTE — Therapy (Signed)
[place], [place], [address] Phone: [phone]   Fax:  [phone]  Physical Therapy Treatment  Patient Details  Name: Lisa Yoder MRN: [id number] Date of Birth: 11/27/1950 Referring Provider (PT): Lindi Adie   Encounter Date: 06/30/2018  PT End of Session - 06/30/18 1349    Visit Number  2    Number of Visits  9    Date for PT Re-Evaluation  08/15/17    PT Start Time  1350    PT Stop Time  1432    PT Time Calculation (min)  42 min    Activity Tolerance  Patient tolerated treatment well    Behavior During Therapy  [facility] for tasks assessed/performed       Past Medical History:  Diagnosis Date  . Anxiety   . Arthritis    "back" (06/05/2018)  . Breast cancer, right breast ([place]) 03/2018  . Chronic lower back pain    "if I don't do yoga" (06/05/2018)  . Depression   . Family history of breast cancer   . GERD (gastroesophageal reflux disease)   . 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 History of hiatal hernia   . Nephritis    "as a child" (06/05/2018)  . Osteoporosis   . SCC (squamous cell carcinoma)    "under my nose" (06/05/2018)  . UTI (urinary tract infection) 8/15  . Wears glasses     Past Surgical History:  Procedure Laterality Date  . BREAST BIOPSY Right 04/2018  . CATARACT EXTRACTION W/ INTRAOCULAR LENS  IMPLANT, BILATERAL Bilateral   . CESAREAN SECTION  1977  . COLONOSCOPY    . CYST REMOVAL NECK Left 03/03/2014   Procedure: LEFT NECK CYST EXCISION;  Surgeon: Pedro Earls, MD;  Location: [place];  Service: General;  Laterality: Left;  . DILATION AND CURETTAGE OF UTERUS    . EYE SURGERY     Strabismus  . MASTECTOMY Left 06/05/2018  . MASTECTOMY COMPLETE / SIMPLE W/ SENTINEL NODE BIOPSY Right 06/05/2018   AND BLUE DYE INJECTION RIGHT BREAST  . MASTECTOMY W/ SENTINEL NODE BIOPSY Bilateral 06/05/2018   Procedure: BILATERAL MASTECTOMIES WITH RIGHT SENTINEL LYMPH  NODE BIOPSY AND BLUE DYE INJECTION RIGHT BREAST;  Surgeon: Donnie Mesa, MD;  Location: [place];  Service: General;  Laterality: Bilateral;  . SQUAMOUS CELL CARCINOMA EXCISION     "under my nose"  . STRABISMUS SURGERY Bilateral 05/02/2018   Procedure: BILATERAL REPAIR STRABISMUS;  Surgeon: Lamonte Sakai, MD;  Location: [place];  Service: Ophthalmology;  Laterality: Bilateral;  . TONSILLECTOMY AND ADENOIDECTOMY    . UPPER GI ENDOSCOPY    . WISDOM TOOTH EXTRACTION      There were no vitals filed for this visit.  Subjective Assessment - 06/30/18 1349    Subjective  I'm just aware that my posture is bad.  The shooting has gone away and less of the pressure pain    Pertinent History   Pt had bilateral mastectomy on nov. 21, with 0/3 nodes on the right  Pt will not have to have chemo and radiation. She notices that she has some decreased range of motion in right shoulder and hip with yoga and has back arthritis and osteoporosis     Patient Stated Goals  to get range of motion back as soon as she can     Currently in Pain?  Yes    Pain Score  6  Pain Location  Chest    Pain Orientation  Anterior    Pain Descriptors / Indicators  Burning;Aching    Pain Type  Surgical pain;Acute pain    Pain Frequency  Intermittent    Aggravating Factors   activity, playing the piano         [facility] PT Assessment - 06/30/18 0001      AROM   Right Shoulder Flexion  150 Degrees    Right Shoulder ABduction  170 Degrees    Left Shoulder Flexion  150 Degrees    Left Shoulder ABduction  170 Degrees                   [facility] Adult PT Treatment/Exercise - 06/30/18 0001      Shoulder Exercises: Supine   Other Supine Exercises  supine behind the stretch butterfly stretch 3x20", dowel flexion 5"x10, TrA review for how to do this during all exercises actiation 5"x5 with tcs and feedback, dowel abduction review, and eliminating dowel ER due to easy for pt      Shoulder Exercises:  Seated   Other Seated Exercises  XYW x 5 with picture given with pt instructed not to perform repeated spinal flexion due to osteoporosis but to just do a scapular protraction movement for the X      Shoulder Exercises: Pulleys   Flexion  2 minutes    Flexion Limitations  with instruction    ABduction  2 minutes    ABduction Limitations  with instruction      Shoulder Exercises: Therapy Ball   Flexion  Both;10 reps    Flexion Limitations  instruction      Manual Therapy   Manual Therapy  Passive ROM    Passive ROM  bil shoulders into flexion, abduction, and ER at 90deg of abduction                  PT Long Term Goals - 06/26/18 1735      PT LONG TERM GOAL #1   Title  Pt will report pain across chest and in right shoulder is decreased to 4/10 at the most     Time  6    Period  Weeks    Status  New      PT LONG TERM GOAL #2   Title  Pt will have bilateral shoulder ROM return to pre-op levels     Time  6    Period  Weeks    Status  New      PT LONG TERM GOAL #3   Title  Pt will be independent in a home exercise program     Time  6    Period  Weeks    Status  New      PT LONG TERM GOAL #4   Title  Pt will decrease Quick DASH score to < 20 indicating functional improvment in her arms     Baseline  63.64    Time  6    Period  Weeks    Status  New            Plan - 06/30/18 1426    Clinical Impression Statement  Pt exhibiting no edema today upon inspection.  Lengthy discussion on starting with gentle yoga and trying a gentle pilates class when ready for core activation.  Posutral stretching focus today.  Excellent tolerance and no increased pain     Clinical Impairments Affecting Rehab Potential  osteoporosis  PT Frequency  2x / week    PT Duration  6 weeks    PT Treatment/Interventions  ADLs/Self Care Home Management;Therapeutic exercise;Manual lymph drainage;Compression bandaging;Taping;Manual techniques;Passive range of motion;Scar mobilization     PT Next Visit Plan  continue bil shoulder ROM working towards pt tolerating yoga and swimming     Consulted and Agree with Plan of Care  Patient       Patient will benefit from skilled therapeutic intervention in order to improve the following deficits and impairments:  Postural dysfunction, Decreased range of motion, Impaired perceived functional ability, Impaired UE functional use, Decreased skin integrity, Decreased scar mobility, Decreased knowledge of precautions, Decreased activity tolerance, Pain  Visit Diagnosis: Aftercare following surgery for neoplasm  Stiffness of right shoulder joint  Stiffness of left shoulder joint  Abnormal posture  Muscle weakness (generalized)  Acute pain of right shoulder     Problem List Patient Active Problem List   Diagnosis Date Noted  . Invasive lobular carcinoma of breast, stage 1, right ([place]) 06/05/2018  . Genetic testing 05/20/2018  . Family history of breast cancer   . Malignant neoplasm of upper-outer quadrant of right breast in female, estrogen receptor positive ([place]) 05/08/2018  . Osteoporosis 12/14/2014  . Rash 12/28/2013  . Infected sebaceous cyst of skin 12/22/2013    Shan Levans, PT 06/30/2018, 3:37 PM  [place], [place], [address] Phone: [phone]   Fax:  [phone]  Name: Lisa Yoder MRN: [id number] Date of Birth: 1951/02/08

## 2018-07-02 ENCOUNTER — Other Ambulatory Visit (INDEPENDENT_AMBULATORY_CARE_PROVIDER_SITE_OTHER): Payer: Self-pay | Admitting: Otolaryngology

## 2018-07-02 DIAGNOSIS — R221 Localized swelling, mass and lump, neck: Secondary | ICD-10-CM

## 2018-07-03 ENCOUNTER — Ambulatory Visit: Payer: Medicare Other

## 2018-07-03 DIAGNOSIS — Z483 Aftercare following surgery for neoplasm: Secondary | ICD-10-CM | POA: Diagnosis not present

## 2018-07-03 DIAGNOSIS — M25511 Pain in right shoulder: Secondary | ICD-10-CM

## 2018-07-03 DIAGNOSIS — M25611 Stiffness of right shoulder, not elsewhere classified: Secondary | ICD-10-CM

## 2018-07-03 DIAGNOSIS — R293 Abnormal posture: Secondary | ICD-10-CM

## 2018-07-03 DIAGNOSIS — M25612 Stiffness of left shoulder, not elsewhere classified: Secondary | ICD-10-CM

## 2018-07-03 DIAGNOSIS — M6281 Muscle weakness (generalized): Secondary | ICD-10-CM

## 2018-07-03 NOTE — Therapy (Signed)
[place], [place], [address] Phone: [phone]   Fax:  [phone]  Physical Therapy Treatment  Patient Details  Name: Lisa Yoder MRN: [id number] Date of Birth: May 12, 1951 Referring Provider (PT): Lindi Adie   Encounter Date: 07/03/2018  PT End of Session - 07/03/18 0848    Visit Number  3    Number of Visits  9    Date for PT Re-Evaluation  08/15/17    PT Start Time  0802    PT Stop Time  2992    PT Time Calculation (min)  45 min    Activity Tolerance  Patient tolerated treatment well    Behavior During Therapy  [facility] for tasks assessed/performed       Past Medical History:  Diagnosis Date  . Anxiety   . Arthritis    "back" (06/05/2018)  . Breast cancer, right breast ([place]) 03/2018  . Chronic lower back pain    "if I don't do yoga" (06/05/2018)  . Depression   . Family history of breast cancer   . GERD (gastroesophageal reflux disease)   . 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 History of hiatal hernia   . Nephritis    "as a child" (06/05/2018)  . Osteoporosis   . SCC (squamous cell carcinoma)    "under my nose" (06/05/2018)  . UTI (urinary tract infection) 8/15  . Wears glasses     Past Surgical History:  Procedure Laterality Date  . BREAST BIOPSY Right 04/2018  . CATARACT EXTRACTION W/ INTRAOCULAR LENS  IMPLANT, BILATERAL Bilateral   . CESAREAN SECTION  1977  . COLONOSCOPY    . CYST REMOVAL NECK Left 03/03/2014   Procedure: LEFT NECK CYST EXCISION;  Surgeon: Pedro Earls, MD;  Location: [place];  Service: General;  Laterality: Left;  . DILATION AND CURETTAGE OF UTERUS    . EYE SURGERY     Strabismus  . MASTECTOMY Left 06/05/2018  . MASTECTOMY COMPLETE / SIMPLE W/ SENTINEL NODE BIOPSY Right 06/05/2018   AND BLUE DYE INJECTION RIGHT BREAST  . MASTECTOMY W/ SENTINEL NODE BIOPSY Bilateral 06/05/2018   Procedure: BILATERAL MASTECTOMIES WITH RIGHT SENTINEL LYMPH  NODE BIOPSY AND BLUE DYE INJECTION RIGHT BREAST;  Surgeon: Donnie Mesa, MD;  Location: [place];  Service: General;  Laterality: Bilateral;  . SQUAMOUS CELL CARCINOMA EXCISION     "under my nose"  . STRABISMUS SURGERY Bilateral 05/02/2018   Procedure: BILATERAL REPAIR STRABISMUS;  Surgeon: Lamonte Sakai, MD;  Location: [place];  Service: Ophthalmology;  Laterality: Bilateral;  . TONSILLECTOMY AND ADENOIDECTOMY    . UPPER GI ENDOSCOPY    . WISDOM TOOTH EXTRACTION      There were no vitals filed for this visit.  Subjective Assessment - 07/03/18 0805    Subjective  I'm doing the HEP but haven't been able to do 2x/day yet. I'm just trying to get a routine donw. I have an MRI scheduled for 12/29 to look at a brain tumor they found, they are reassessing it for any change.    Pertinent History   Pt had bilateral mastectomy on nov. 21, with 0/3 nodes on the right  Pt will not have to have chemo and radiation. She notices that she has some decreased range of motion in right shoulder and hip with yoga and has back arthritis and osteoporosis     Patient Stated Goals  to get range of motion back as soon as  she can     Currently in Pain?  No/denies   just discomfort at bil chest wall                      [facility] Adult PT Treatment/Exercise - 07/03/18 0001      Manual Therapy   Manual Therapy  Myofascial release;Passive ROM    Myofascial Release  To bil chest wall above incisions (not on as pt issn't 6 weeks yet) horizontally and UE pulling during P/ROM    Passive ROM  bil shoulders into flexion, abduction, and D2 to pts tolerance                  PT Long Term Goals - 06/26/18 1735      PT LONG TERM GOAL #1   Title  Pt will report pain across chest and in right shoulder is decreased to 4/10 at the most     Time  6    Period  Weeks    Status  New      PT LONG TERM GOAL #2   Title  Pt will have bilateral shoulder ROM return to pre-op levels     Time   6    Period  Weeks    Status  New      PT LONG TERM GOAL #3   Title  Pt will be independent in a home exercise program     Time  6    Period  Weeks    Status  New      PT LONG TERM GOAL #4   Title  Pt will decrease Quick DASH score to < 20 indicating functional improvment in her arms     Baseline  63.64    Time  6    Period  Weeks    Status  New            Plan - 07/03/18 0849    Clinical Impression Statement  Focused today on manual therapy to bil chest walls and shoulders. Pt reports feeling much looser after stretching and myofscial release. Did caution her to avoid aggressive scar mobs until she is closer to 6 weeks as she reports has been doing scar mobs. She verbalized understanding.     Rehab Potential  Good    Clinical Impairments Affecting Rehab Potential  osteoporosis     PT Frequency  2x / week    PT Duration  6 weeks    PT Treatment/Interventions  ADLs/Self Care Home Management;Therapeutic exercise;Manual lymph drainage;Compression bandaging;Taping;Manual techniques;Passive range of motion;Scar mobilization    PT Next Visit Plan  continue bil shoulder ROM and myofascial release working towards pt tolerating yoga and swimming     Consulted and Agree with Plan of Care  Patient       Patient will benefit from skilled therapeutic intervention in order to improve the following deficits and impairments:  Postural dysfunction, Decreased range of motion, Impaired perceived functional ability, Impaired UE functional use, Decreased skin integrity, Decreased scar mobility, Decreased knowledge of precautions, Decreased activity tolerance, Pain  Visit Diagnosis: Aftercare following surgery for neoplasm  Stiffness of right shoulder joint  Stiffness of left shoulder joint  Abnormal posture  Muscle weakness (generalized)  Acute pain of right shoulder     Problem List Patient Active Problem List   Diagnosis Date Noted  . Invasive lobular carcinoma of breast, stage  1, right ([place]) 06/05/2018  . Genetic testing 05/20/2018  . Family history of breast  cancer   . Malignant neoplasm of upper-outer quadrant of right breast in female, estrogen receptor positive ([place]) 05/08/2018  . Osteoporosis 12/14/2014  . Rash 12/28/2013  . Infected sebaceous cyst of skin 12/22/2013    Lisa Yoder, PTA 07/03/2018, 8:51 AM  [place], [place], [address] Phone: [phone]   Fax:  [phone]  Name: KENOSHA DOSTER MRN: [id number] Date of Birth: 1950-07-31

## 2018-07-07 ENCOUNTER — Encounter: Payer: Medicare Other | Admitting: Rehabilitation

## 2018-07-08 ENCOUNTER — Ambulatory Visit: Payer: Medicare Other

## 2018-07-08 DIAGNOSIS — M6281 Muscle weakness (generalized): Secondary | ICD-10-CM

## 2018-07-08 DIAGNOSIS — M25612 Stiffness of left shoulder, not elsewhere classified: Secondary | ICD-10-CM

## 2018-07-08 DIAGNOSIS — M25611 Stiffness of right shoulder, not elsewhere classified: Secondary | ICD-10-CM

## 2018-07-08 DIAGNOSIS — Z483 Aftercare following surgery for neoplasm: Secondary | ICD-10-CM | POA: Diagnosis not present

## 2018-07-08 DIAGNOSIS — M25511 Pain in right shoulder: Secondary | ICD-10-CM

## 2018-07-08 DIAGNOSIS — R293 Abnormal posture: Secondary | ICD-10-CM

## 2018-07-08 NOTE — Therapy (Signed)
[place], [place], [address] Phone: [phone]   Fax:  [phone]  Physical Therapy Treatment  Patient Details  Name: Lisa Yoder MRN: [id number] Date of Birth: 1950/08/05 Referring Provider (PT): Lindi Adie   Encounter Date: 07/08/2018  PT End of Session - 07/08/18 1200    Visit Number  4    Number of Visits  9    Date for PT Re-Evaluation  08/15/17    PT Start Time  1110    PT Stop Time  1151    PT Time Calculation (min)  41 min    Activity Tolerance  Patient tolerated treatment well    Behavior During Therapy  [facility] for tasks assessed/performed       Past Medical History:  Diagnosis Date  . Anxiety   . Arthritis    "back" (06/05/2018)  . Breast cancer, right breast ([place]) 03/2018  . Chronic lower back pain    "if I don't do yoga" (06/05/2018)  . Depression   . Family history of breast cancer   . GERD (gastroesophageal reflux disease)   . 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 History of hiatal hernia   . Nephritis    "as a child" (06/05/2018)  . Osteoporosis   . SCC (squamous cell carcinoma)    "under my nose" (06/05/2018)  . UTI (urinary tract infection) 8/15  . Wears glasses     Past Surgical History:  Procedure Laterality Date  . BREAST BIOPSY Right 04/2018  . CATARACT EXTRACTION W/ INTRAOCULAR LENS  IMPLANT, BILATERAL Bilateral   . CESAREAN SECTION  1977  . COLONOSCOPY    . CYST REMOVAL NECK Left 03/03/2014   Procedure: LEFT NECK CYST EXCISION;  Surgeon: Pedro Earls, MD;  Location: [place];  Service: General;  Laterality: Left;  . DILATION AND CURETTAGE OF UTERUS    . EYE SURGERY     Strabismus  . MASTECTOMY Left 06/05/2018  . MASTECTOMY COMPLETE / SIMPLE W/ SENTINEL NODE BIOPSY Right 06/05/2018   AND BLUE DYE INJECTION RIGHT BREAST  . MASTECTOMY W/ SENTINEL NODE BIOPSY Bilateral 06/05/2018   Procedure: BILATERAL MASTECTOMIES WITH RIGHT SENTINEL LYMPH  NODE BIOPSY AND BLUE DYE INJECTION RIGHT BREAST;  Surgeon: Donnie Mesa, MD;  Location: [place];  Service: General;  Laterality: Bilateral;  . SQUAMOUS CELL CARCINOMA EXCISION     "under my nose"  . STRABISMUS SURGERY Bilateral 05/02/2018   Procedure: BILATERAL REPAIR STRABISMUS;  Surgeon: Lamonte Sakai, MD;  Location: [place];  Service: Ophthalmology;  Laterality: Bilateral;  . TONSILLECTOMY AND ADENOIDECTOMY    . UPPER GI ENDOSCOPY    . WISDOM TOOTH EXTRACTION      There were no vitals filed for this visit.  Subjective Assessment - 07/08/18 1114    Subjective  The tightness is much improved. I can tell all of this is helping.    Pertinent History   Pt had bilateral mastectomy on nov. 21, with 0/3 nodes on the right  Pt will not have to have chemo and radiation. She notices that she has some decreased range of motion in right shoulder and hip with yoga and has back arthritis and osteoporosis     Patient Stated Goals  to get range of motion back as soon as she can     Currently in Pain?  No/denies         [facility] PT Assessment - 07/08/18 0001  AROM   Right Shoulder Flexion  156 Degrees    Right Shoulder ABduction  175 Degrees    Left Shoulder Flexion  155 Degrees    Left Shoulder ABduction  174 Degrees                   [facility] Adult PT Treatment/Exercise - 07/08/18 0001      Shoulder Exercises: Supine   Horizontal ABduction  Strengthening;Both;10 reps;Theraband    Theraband Level (Shoulder Horizontal ABduction)  Level 2 (Red)    External Rotation  Strengthening;Both;10 reps;Theraband    Theraband Level (Shoulder External Rotation)  Level 2 (Red)    Flexion  Strengthening;Both;10 reps;Theraband   Narrow and Wide Grip, 10 times each   Theraband Level (Shoulder Flexion)  Level 2 (Red)    Diagonals  Strengthening;Right;Left;10 reps;Theraband    Theraband Level (Shoulder Diagonals)  Level 2 (Red)      Manual Therapy   Manual Therapy  Myofascial  release;Passive ROM    Myofascial Release  To bil chest wall above incisions (not on as pt issn't 6 weeks yet) horizontally and UE pulling during P/ROM    Passive ROM  bil shoulders into flexion, abduction, and D2 to pts tolerance             PT Education - 07/08/18 1127    Education Details  Supine scapular series    Person(s) Educated  Patient    Methods  Explanation;Demonstration;Handout    Comprehension  Verbalized understanding;Returned demonstration          PT Long Term Goals - 06/26/18 1735      PT LONG TERM GOAL #1   Title  Pt will report pain across chest and in right shoulder is decreased to 4/10 at the most     Time  6    Period  Weeks    Status  New      PT LONG TERM GOAL #2   Title  Pt will have bilateral shoulder ROM return to pre-op levels     Time  6    Period  Weeks    Status  New      PT LONG TERM GOAL #3   Title  Pt will be independent in a home exercise program     Time  6    Period  Weeks    Status  New      PT LONG TERM GOAL #4   Title  Pt will decrease Quick DASH score to < 20 indicating functional improvment in her arms     Baseline  63.64    Time  6    Period  Weeks    Status  New            Plan - 07/08/18 1201    Clinical Impression Statement  Progressed HEP to include supine scapular series which she tolerated well. Then continued with focus to bil shoulder and chest walls for P/ROMa nd myofascial release. She has gained 5 and 6 degrees of flexion and 4 and 5 degrees of abduction since last measured.     Rehab Potential  Good    Clinical Impairments Affecting Rehab Potential  osteoporosis     PT Frequency  2x / week    PT Duration  6 weeks    PT Treatment/Interventions  ADLs/Self Care Home Management;Therapeutic exercise;Manual lymph drainage;Compression bandaging;Taping;Manual techniques;Passive range of motion;Scar mobilization    PT Next Visit Plan  continue bil shoulder ROM and myofascial release working  towards pt  tolerating yoga and swimming; review supine scapular series prn    Consulted and Agree with Plan of Care  Patient       Patient will benefit from skilled therapeutic intervention in order to improve the following deficits and impairments:  Postural dysfunction, Decreased range of motion, Impaired perceived functional ability, Impaired UE functional use, Decreased skin integrity, Decreased scar mobility, Decreased knowledge of precautions, Decreased activity tolerance, Pain  Visit Diagnosis: Aftercare following surgery for neoplasm  Stiffness of right shoulder joint  Stiffness of left shoulder joint  Abnormal posture  Muscle weakness (generalized)  Acute pain of right shoulder     Problem List Patient Active Problem List   Diagnosis Date Noted  . Invasive lobular carcinoma of breast, stage 1, right ([place]) 06/05/2018  . Genetic testing 05/20/2018  . Family history of breast cancer   . Malignant neoplasm of upper-outer quadrant of right breast in female, estrogen receptor positive ([place]) 05/08/2018  . Osteoporosis 12/14/2014  . Rash 12/28/2013  . Infected sebaceous cyst of skin 12/22/2013    Otelia Limes, PTA 07/08/2018, 12:04 PM  [place], [place], [address] Phone: [phone]   Fax:  [phone]  Name: Lisa Yoder MRN: [id number] Date of Birth: 06-Aug-1950

## 2018-07-08 NOTE — Patient Instructions (Signed)

## 2018-07-13 ENCOUNTER — Other Ambulatory Visit: Payer: Medicare Other

## 2018-07-15 ENCOUNTER — Encounter

## 2018-07-18 ENCOUNTER — Ambulatory Visit: Payer: Medicare Other | Attending: Hematology and Oncology | Admitting: Physical Therapy

## 2018-07-18 ENCOUNTER — Encounter: Payer: Self-pay | Admitting: Physical Therapy

## 2018-07-18 DIAGNOSIS — R293 Abnormal posture: Secondary | ICD-10-CM

## 2018-07-18 DIAGNOSIS — M25612 Stiffness of left shoulder, not elsewhere classified: Secondary | ICD-10-CM | POA: Diagnosis present

## 2018-07-18 DIAGNOSIS — M6281 Muscle weakness (generalized): Secondary | ICD-10-CM

## 2018-07-18 DIAGNOSIS — Z483 Aftercare following surgery for neoplasm: Secondary | ICD-10-CM | POA: Insufficient documentation

## 2018-07-18 DIAGNOSIS — M25511 Pain in right shoulder: Secondary | ICD-10-CM | POA: Diagnosis present

## 2018-07-18 DIAGNOSIS — M25611 Stiffness of right shoulder, not elsewhere classified: Secondary | ICD-10-CM | POA: Insufficient documentation

## 2018-07-18 NOTE — Therapy (Signed)
[place], [place], [address] Phone: [phone]   Fax:  [phone]  Physical Therapy Treatment  Patient Details  Name: Lisa Yoder MRN: [id number] Date of Birth: [birth year] Referring Provider (PT): Lindi Adie   Encounter Date: 07/18/2018  PT End of Session - 07/18/18 1111    Visit Number  5    Number of Visits  9    Date for PT Re-Evaluation  08/15/17    PT Start Time  6195    PT Stop Time  1100    PT Time Calculation (min)  45 min    Activity Tolerance  Patient tolerated treatment well    Behavior During Therapy  [facility] for tasks assessed/performed       Past Medical History:  Diagnosis Date  . Anxiety   . Arthritis    "back" (06/05/2018)  . Breast cancer, right breast ([place]) 03/2018  . Chronic lower back pain    "if I don't do yoga" (06/05/2018)  . Depression   . Family history of breast cancer   . GERD (gastroesophageal reflux disease)   . 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 History of hiatal hernia   . Nephritis    "as a child" (06/05/2018)  . Osteoporosis   . SCC (squamous cell carcinoma)    "under my nose" (06/05/2018)  . UTI (urinary tract infection) 8/15  . Wears glasses     Past Surgical History:  Procedure Laterality Date  . BREAST BIOPSY Right 04/2018  . CATARACT EXTRACTION W/ INTRAOCULAR LENS  IMPLANT, BILATERAL Bilateral   . CESAREAN SECTION  1977  . COLONOSCOPY    . CYST REMOVAL NECK Left 03/03/2014   Procedure: LEFT NECK CYST EXCISION;  Surgeon: Pedro Earls, MD;  Location: [place];  Service: General;  Laterality: Left;  . DILATION AND CURETTAGE OF UTERUS    . EYE SURGERY     Strabismus  . MASTECTOMY Left 06/05/2018  . MASTECTOMY COMPLETE / SIMPLE W/ SENTINEL NODE BIOPSY Right 06/05/2018   AND BLUE DYE INJECTION RIGHT BREAST  . MASTECTOMY W/ SENTINEL NODE BIOPSY Bilateral 06/05/2018   Procedure: BILATERAL MASTECTOMIES WITH RIGHT SENTINEL LYMPH NODE  BIOPSY AND BLUE DYE INJECTION RIGHT BREAST;  Surgeon: Donnie Mesa, MD;  Location: [place];  Service: General;  Laterality: Bilateral;  . SQUAMOUS CELL CARCINOMA EXCISION     "under my nose"  . STRABISMUS SURGERY Bilateral 05/02/2018   Procedure: BILATERAL REPAIR STRABISMUS;  Surgeon: Lamonte Sakai, MD;  Location: [place];  Service: Ophthalmology;  Laterality: Bilateral;  . TONSILLECTOMY AND ADENOIDECTOMY    . UPPER GI ENDOSCOPY    . WISDOM TOOTH EXTRACTION      There were no vitals filed for this visit.  Subjective Assessment - 07/18/18 1019    Subjective  Pt has a lot going on. She is getting ready to move to [facility] and is concerned about her ill husband. She says the nerve pain is better, but" Its under the arms that hurts "    Pertinent History   Pt had bilateral mastectomy on nov. 21, with 0/3 nodes on the right  Pt will not have to have chemo and radiation. She notices that she has some decreased range of motion in right shoulder and hip with yoga and has back arthritis and osteoporosis     Patient Stated Goals  to get range of motion back as soon as she can  Currently in Pain?  Yes    Pain Score  6     Pain Location  Axilla    Pain Orientation  Right;Left   more on the right but also on the left    Pain Descriptors / Indicators  --   irritating   Pain Type  Acute pain;Surgical pain    Pain Radiating Towards  to armpit                            [facility] Adult PT Treatment/Exercise - 07/18/18 0001      Self-Care   Other Self-Care Comments   made 2 chip packs in tg soft for pt to wear at each axiila under a tight shirt.  Gave pt information about Veva Holes compression shirt and she will check to see if she can get insurance coverage for it.       Exercises   Exercises  Shoulder;Lumbar      Lumbar Exercises: Stretches   Single Knee to Chest Stretch  1 rep    Double Knee to Chest Stretch  2 reps    Lower Trunk Rotation  5 reps     Pelvic Tilt  5 reps   supine and standing      Shoulder Exercises: Supine   Other Supine Exercises  lying over purple ball on thoracic spine for bilateral horizontal abducion and scapular retraction       Shoulder Exercises: Sidelying   ABduction  AROM;Right;10 reps    Other Sidelying Exercises  small circles progressing to larger circles with hand pointed toward ceiling.     Other Sidelying Exercises  forward and backward rotation of thoracic spine       Manual Therapy   Manual Therapy  Myofascial release;Manual Lymphatic Drainage (MLD);Passive ROM    Manual therapy comments  instructed in self MLD to abdomen under incisions  and right axilla     Myofascial Release  to bilateral chest and trunk around incisions     Manual Lymphatic Drainage (MLD)  right iguinal nodes and right anterior shoulder, axilla and lateral chest      Passive ROM  bil shoulders into flexion, abduction, and D2 to pts tolerance                  PT Long Term Goals - 06/26/18 1735      PT LONG TERM GOAL #1   Title  Pt will report pain across chest and in right shoulder is decreased to 4/10 at the most     Time  6    Period  Weeks    Status  New      PT LONG TERM GOAL #2   Title  Pt will have bilateral shoulder ROM return to pre-op levels     Time  6    Period  Weeks    Status  New      PT LONG TERM GOAL #3   Title  Pt will be independent in a home exercise program     Time  6    Period  Weeks    Status  New      PT LONG TERM GOAL #4   Title  Pt will decrease Quick DASH score to < 20 indicating functional improvment in her arms     Baseline  63.64    Time  6    Period  Weeks    Status  New  Patient will benefit from skilled therapeutic intervention in order to improve the following deficits and impairments:     Visit Diagnosis: Aftercare following surgery for neoplasm  Stiffness of right shoulder joint  Stiffness of left shoulder joint  Abnormal  posture  Muscle weakness (generalized)     Problem List Patient Active Problem List   Diagnosis Date Noted  . Invasive lobular carcinoma of breast, stage 1, right ([place]) 06/05/2018  . Genetic testing 05/20/2018  . Family history of breast cancer   . Malignant neoplasm of upper-outer quadrant of right breast in female, estrogen receptor positive ([place]) 05/08/2018  . Osteoporosis 12/14/2014  . Rash 12/28/2013  . Infected sebaceous cyst of skin 12/22/2013   Donato Heinz. Owens Shark PT  Norwood Levo 07/18/2018, 11:13 AM  [place], [place], [address] Phone: [phone]   Fax:  [phone]  Name: Lisa Yoder MRN: [id number] Date of Birth: 01/20/1951

## 2018-07-20 ENCOUNTER — Ambulatory Visit
Admission: RE | Admit: 2018-07-20 | Discharge: 2018-07-20 | Disposition: A | Discharge status: Home / Self Care | Payer: Medicare Other | Source: Ambulatory Visit | Attending: Otolaryngology | Admitting: Otolaryngology

## 2018-07-20 DIAGNOSIS — R221 Localized swelling, mass and lump, neck: Secondary | ICD-10-CM

## 2018-07-20 MED ORDER — GADOBENATE DIMEGLUMINE 529 MG/ML IV SOLN
10.0000 mL | Freq: Once | INTRAVENOUS | Status: AC | PRN
  Administered 2018-07-20: 10 mL via INTRAVENOUS

## 2018-07-22 ENCOUNTER — Ambulatory Visit: Payer: Medicare Other | Admitting: Physical Therapy

## 2018-07-22 DIAGNOSIS — M6281 Muscle weakness (generalized): Secondary | ICD-10-CM

## 2018-07-22 DIAGNOSIS — Z483 Aftercare following surgery for neoplasm: Secondary | ICD-10-CM

## 2018-07-22 DIAGNOSIS — R293 Abnormal posture: Secondary | ICD-10-CM

## 2018-07-22 DIAGNOSIS — M25612 Stiffness of left shoulder, not elsewhere classified: Secondary | ICD-10-CM

## 2018-07-22 DIAGNOSIS — M25611 Stiffness of right shoulder, not elsewhere classified: Secondary | ICD-10-CM

## 2018-07-22 NOTE — Therapy (Signed)
Lisa Yoder, [place], [address] Phone: [phone]   Fax:  [phone]  Physical Therapy Treatment  Patient Details  Name: Lisa Yoder MRN: [id number] Date of Birth: 1951/03/30 Referring Provider (PT): Lindi Adie   Encounter Date: 07/22/2018  PT End of Session - 07/22/18 1224    Visit Number  6    Number of Visits  9    Date for PT Re-Evaluation  08/15/17    PT Start Time  1029   pt arrived late    PT Stop Time  1100    PT Time Calculation (min)  31 min    Activity Tolerance  Patient tolerated treatment well    Behavior During Therapy  [facility] for tasks assessed/performed       Past Medical History:  Diagnosis Date  . Anxiety   . Arthritis    "back" (06/05/2018)  . Breast cancer, right breast ([place]) 03/2018  . Chronic lower back pain    "if I don't do yoga" (06/05/2018)  . Depression   . Family history of breast cancer   . GERD (gastroesophageal reflux disease)   . 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 History of hiatal hernia   . Nephritis    "as a child" (06/05/2018)  . Osteoporosis   . SCC (squamous cell carcinoma)    "under my nose" (06/05/2018)  . UTI (urinary tract infection) 8/15  . Wears glasses     Past Surgical History:  Procedure Laterality Date  . BREAST BIOPSY Right 04/2018  . CATARACT EXTRACTION W/ INTRAOCULAR LENS  IMPLANT, BILATERAL Bilateral   . CESAREAN SECTION  1977  . COLONOSCOPY    . CYST REMOVAL NECK Left 03/03/2014   Procedure: LEFT NECK CYST EXCISION;  Surgeon: Pedro Earls, MD;  Location: [place];  Service: General;  Laterality: Left;  . DILATION AND CURETTAGE OF UTERUS    . EYE SURGERY     Strabismus  . MASTECTOMY Left 06/05/2018  . MASTECTOMY COMPLETE / SIMPLE W/ SENTINEL NODE BIOPSY Right 06/05/2018   AND BLUE DYE INJECTION RIGHT BREAST  . MASTECTOMY W/ SENTINEL NODE BIOPSY Bilateral 06/05/2018   Procedure: BILATERAL MASTECTOMIES WITH RIGHT  SENTINEL LYMPH NODE BIOPSY AND BLUE DYE INJECTION RIGHT BREAST;  Surgeon: Donnie Mesa, MD;  Location: [place];  Service: General;  Laterality: Bilateral;  . SQUAMOUS CELL CARCINOMA EXCISION     "under my nose"  . STRABISMUS SURGERY Bilateral 05/02/2018   Procedure: BILATERAL REPAIR STRABISMUS;  Surgeon: Lamonte Sakai, MD;  Location: [place];  Service: Ophthalmology;  Laterality: Bilateral;  . TONSILLECTOMY AND ADENOIDECTOMY    . UPPER GI ENDOSCOPY    . WISDOM TOOTH EXTRACTION      There were no vitals filed for this visit.  Subjective Assessment - 07/22/18 1029    Subjective  Pt states the irritation is under her arms, but she still gets the buring across the chest .  She tried the chip packs and found that it really felt good.  She has not had a chance to call about the vest.  She tried gentle     Pertinent History   Pt had bilateral mastectomy on nov. 21, with 0/3 nodes on the right  Pt will not have to have chemo and radiation. She notices that she has some decreased range of motion in right shoulder and hip with yoga and has back arthritis and osteoporosis  [facility] Adult PT Treatment/Exercise - 07/22/18 0001      Shoulder Exercises: Supine   Other Supine Exercises  Meeks decompression instruction and pt able to return demonstation 5-10 reps of each       Manual Therapy   Myofascial Release  to bilateral chest and trunk around incisions     Manual Lymphatic Drainage (MLD)  right iguinal nodes and right anterior shoulder, axilla and lateral chest                    PT Long Term Goals - 06/26/18 1735      PT LONG TERM GOAL #1   Title  Pt will report pain across chest and in right shoulder is decreased to 4/10 at the most     Time  6    Period  Weeks    Status  New      PT LONG TERM GOAL #2   Title  Pt will have bilateral shoulder ROM return to pre-op levels     Time  6    Period  Weeks    Status  New      PT  LONG TERM GOAL #3   Title  Pt will be independent in a home exercise program     Time  6    Period  Weeks    Status  New      PT LONG TERM GOAL #4   Title  Pt will decrease Quick DASH score to < 20 indicating functional improvment in her arms     Baseline  63.64    Time  6    Period  Weeks    Status  New            Plan - 07/22/18 1225    Clinical Impression Statement  Swelling in right axilla appears to be softer today.  Pt says she is getting some relief with the chip packs and will call to see if she has coverage for the andrea compression shirt.     Rehab Potential  Good    Clinical Impairments Affecting Rehab Potential  osteoporosis     PT Frequency  2x / week    PT Duration  6 weeks    PT Treatment/Interventions  ADLs/Self Care Home Management;Therapeutic exercise;Manual lymph drainage;Compression bandaging;Taping;Manual techniques;Passive range of motion;Scar mobilization    PT Next Visit Plan  Assess goals continue bil shoulder ROM and myofascial release working towards pt tolerating yoga and swimming; review supine scapular series prn    Consulted and Agree with Plan of Care  Patient       Patient will benefit from skilled therapeutic intervention in order to improve the following deficits and impairments:  Postural dysfunction, Decreased range of motion, Impaired perceived functional ability, Impaired UE functional use, Decreased skin integrity, Decreased scar mobility, Decreased knowledge of precautions, Decreased activity tolerance, Pain  Visit Diagnosis: Aftercare following surgery for neoplasm  Stiffness of right shoulder joint  Stiffness of left shoulder joint  Abnormal posture  Muscle weakness (generalized)     Problem List Patient Active Problem List   Diagnosis Date Noted  . Invasive lobular carcinoma of breast, stage 1, right ([place]) 06/05/2018  . Genetic testing 05/20/2018  . Family history of breast cancer   . Malignant neoplasm of upper-outer  quadrant of right breast in female, estrogen receptor positive ([place]) 05/08/2018  . Osteoporosis 12/14/2014  . Rash 12/28/2013  . Infected sebaceous cyst of skin 12/22/2013   Lisa Yoder. Owens Shark,  PT  Norwood Levo 07/22/2018, 12:26 PM  [place], [place], [address] Phone: [phone]   Fax:  [phone]  Name: Lisa Yoder MRN: [id number] Date of Birth: 12-16-1950

## 2018-07-22 NOTE — Patient Instructions (Signed)

## 2018-07-24 ENCOUNTER — Ambulatory Visit: Payer: Medicare Other | Admitting: Physical Therapy

## 2018-07-24 ENCOUNTER — Encounter: Payer: Self-pay | Admitting: Physical Therapy

## 2018-07-24 DIAGNOSIS — M6281 Muscle weakness (generalized): Secondary | ICD-10-CM

## 2018-07-24 DIAGNOSIS — Z483 Aftercare following surgery for neoplasm: Secondary | ICD-10-CM | POA: Diagnosis not present

## 2018-07-24 DIAGNOSIS — M25612 Stiffness of left shoulder, not elsewhere classified: Secondary | ICD-10-CM

## 2018-07-24 DIAGNOSIS — M25611 Stiffness of right shoulder, not elsewhere classified: Secondary | ICD-10-CM

## 2018-07-24 DIAGNOSIS — R293 Abnormal posture: Secondary | ICD-10-CM

## 2018-07-24 NOTE — Therapy (Signed)
[place], [place], [address] Phone: [phone]   Fax:  [phone]  Physical Therapy Treatment  Patient Details  Name: Lisa Yoder MRN: [id number] Date of Birth: 12-May-1951 Referring Provider (PT): Lindi Adie   Encounter Date: 07/24/2018  PT End of Session - 07/24/18 1618    Visit Number  7    Number of Visits  9    Date for PT Re-Evaluation  08/15/17    PT Start Time  1525    PT Stop Time  1610    PT Time Calculation (min)  45 min    Activity Tolerance  Patient tolerated treatment well    Behavior During Therapy  [facility] for tasks assessed/performed       Past Medical History:  Diagnosis Date  . Anxiety   . Arthritis    "back" (06/05/2018)  . Breast cancer, right breast ([place]) 03/2018  . Chronic lower back pain    "if I don't do yoga" (06/05/2018)  . Depression   . Family history of breast cancer   . GERD (gastroesophageal reflux disease)   . 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 History of hiatal hernia   . Nephritis    "as a child" (06/05/2018)  . Osteoporosis   . SCC (squamous cell carcinoma)    "under my nose" (06/05/2018)  . UTI (urinary tract infection) 8/15  . Wears glasses     Past Surgical History:  Procedure Laterality Date  . BREAST BIOPSY Right 04/2018  . CATARACT EXTRACTION W/ INTRAOCULAR LENS  IMPLANT, BILATERAL Bilateral   . CESAREAN SECTION  1977  . COLONOSCOPY    . CYST REMOVAL NECK Left 03/03/2014   Procedure: LEFT NECK CYST EXCISION;  Surgeon: Pedro Earls, MD;  Location: [place];  Service: General;  Laterality: Left;  . DILATION AND CURETTAGE OF UTERUS    . EYE SURGERY     Strabismus  . MASTECTOMY Left 06/05/2018  . MASTECTOMY COMPLETE / SIMPLE W/ SENTINEL NODE BIOPSY Right 06/05/2018   AND BLUE DYE INJECTION RIGHT BREAST  . MASTECTOMY W/ SENTINEL NODE BIOPSY Bilateral 06/05/2018   Procedure: BILATERAL MASTECTOMIES WITH RIGHT SENTINEL LYMPH NODE  BIOPSY AND BLUE DYE INJECTION RIGHT BREAST;  Surgeon: Donnie Mesa, MD;  Location: [place];  Service: General;  Laterality: Bilateral;  . SQUAMOUS CELL CARCINOMA EXCISION     "under my nose"  . STRABISMUS SURGERY Bilateral 05/02/2018   Procedure: BILATERAL REPAIR STRABISMUS;  Surgeon: Lamonte Sakai, MD;  Location: [place];  Service: Ophthalmology;  Laterality: Bilateral;  . TONSILLECTOMY AND ADENOIDECTOMY    . UPPER GI ENDOSCOPY    . WISDOM TOOTH EXTRACTION      There were no vitals filed for this visit.  Subjective Assessment - 07/24/18 1535    Subjective  pt states she went to a yoga class twice this week and is doing better.  She is no longer having the irritation under her arms. She is getting her Engineer, maintenance (IT) next week     Pertinent History   Pt had bilateral mastectomy on nov. 21, with 0/3 nodes on the right  Pt will not have to have chemo and radiation. She notices that she has some decreased range of motion in right shoulder and hip with yoga and has back arthritis and osteoporosis     Patient Stated Goals  to get range of motion back as soon as she can  Currently in Pain?  Yes    Pain Score  4     Pain Location  Chest    Pain Orientation  Right;Left    Pain Descriptors / Indicators  Burning         [facility] PT Assessment - 07/24/18 0001      AROM   Right Shoulder Flexion  170 Degrees    Right Shoulder ABduction  175 Degrees    Left Shoulder Flexion  155 Degrees    Left Shoulder ABduction  170 Degrees                   [facility] Adult PT Treatment/Exercise - 07/24/18 0001      Exercises   Exercises  Shoulder      Shoulder Exercises: Standing   Row  Strengthening;Right;Left;5 reps;Theraband    Theraband Level (Shoulder Row)  Level 2 (Red)    Other Standing Exercises  external rotation walk outs with red theraband x 5 reps with each arm       Manual Therapy   Myofascial Release  to bilateral chest and trunk around incisions      Passive ROM  stretching to right shoulder in diagonal elevation                   PT Long Term Goals - 07/24/18 1526      PT LONG TERM GOAL #1   Title  Pt will report pain across chest and in right shoulder is decreased to 4/10 at the most     Baseline  pt states she still has the buring sensation across the ches to about 3 or 4     Status  Achieved      PT LONG TERM GOAL #2   Title  Pt will have bilateral shoulder ROM return to pre-op levels     Status  Achieved      PT LONG TERM GOAL #3   Title  Pt will be independent in a home exercise program     Time  6    Period  Weeks    Status  On-going      PT LONG TERM GOAL #4   Title  Pt will decrease Quick DASH score to < 20 indicating functional improvment in her arms     Baseline  63.64    Status  On-going            Plan - 07/24/18 1618    Clinical Impression Statement  Pt continues to make gradual progress.  Upgraded home exercise today to include more advanced scapular strengthening  Pt is going to community exercise classes for yoga and is interested in learing the stregth ABC program so she can return to weight lifting on her own at home. She continues to have tightness in anterior chest especially in right axilla to chest at lateral incision      Rehab Potential  Good    Clinical Impairments Affecting Rehab Potential  osteoporosis     PT Frequency  2x / week    PT Duration  6 weeks    PT Treatment/Interventions  ADLs/Self Care Home Management;Therapeutic exercise;Manual lymph drainage;Compression bandaging;Taping;Manual techniques;Passive range of motion;Scar mobilization    PT Next Visit Plan  Teach Strength ABC progam continue bil shoulder ROM and myofascial release working towards pt tolerating yoga and swimming; review supine scapular series prn    Consulted and Agree with Plan of Care  Patient       Patient  will benefit from skilled therapeutic intervention in order to improve the following deficits and  impairments:  Postural dysfunction, Decreased range of motion, Impaired perceived functional ability, Impaired UE functional use, Decreased skin integrity, Decreased scar mobility, Decreased knowledge of precautions, Decreased activity tolerance, Pain  Visit Diagnosis: Aftercare following surgery for neoplasm  Stiffness of right shoulder joint  Stiffness of left shoulder joint  Abnormal posture  Muscle weakness (generalized)     Problem List Patient Active Problem List   Diagnosis Date Noted  . Invasive lobular carcinoma of breast, stage 1, right ([place]) 06/05/2018  . Genetic testing 05/20/2018  . Family history of breast cancer   . Malignant neoplasm of upper-outer quadrant of right breast in female, estrogen receptor positive ([place]) 05/08/2018  . Osteoporosis 12/14/2014  . Rash 12/28/2013  . Infected sebaceous cyst of skin 12/22/2013   Donato Heinz. Owens Shark PT  Norwood Levo 07/24/2018, 4:22 PM  [place], [place], [address] Phone: [phone]   Fax:  [phone]  Name: KENZLEI RUNIONS MRN: [id number] Date of Birth: 1950/10/03

## 2018-07-24 NOTE — Patient Instructions (Signed)
Access Code: [email or web address]  URL: [email or web address]  Date: 07/24/2018  Prepared by: Maudry Diego   Exercises  Standing High Row with Resistance - 10 reps - 3 sets - 1x daily - 7x weekly  Shoulder External Rotation Reactive Isometrics - 10 reps - 3 sets - 1x daily - 7x weekly

## 2018-07-29 ENCOUNTER — Ambulatory Visit: Payer: Medicare Other | Admitting: Physical Therapy

## 2018-07-29 ENCOUNTER — Encounter: Payer: Self-pay | Admitting: Physical Therapy

## 2018-07-29 DIAGNOSIS — M25511 Pain in right shoulder: Secondary | ICD-10-CM

## 2018-07-29 DIAGNOSIS — M6281 Muscle weakness (generalized): Secondary | ICD-10-CM

## 2018-07-29 DIAGNOSIS — M25611 Stiffness of right shoulder, not elsewhere classified: Secondary | ICD-10-CM

## 2018-07-29 DIAGNOSIS — R293 Abnormal posture: Secondary | ICD-10-CM

## 2018-07-29 DIAGNOSIS — Z483 Aftercare following surgery for neoplasm: Secondary | ICD-10-CM

## 2018-07-29 DIAGNOSIS — M25612 Stiffness of left shoulder, not elsewhere classified: Secondary | ICD-10-CM

## 2018-07-29 NOTE — Therapy (Signed)
[place], [place], [address] Phone: [phone]   Fax:  [phone]  Physical Therapy Treatment  Patient Details  Name: Lisa Yoder MRN: [id number] Date of Birth: [birth year] Referring Provider (PT): Lindi Adie   Encounter Date: 07/29/2018  PT End of Session - 07/29/18 3704    Visit Number  8    Number of Visits  9    Date for PT Re-Evaluation  08/15/17    PT Start Time  1020    PT Stop Time  1100    PT Time Calculation (min)  40 min    Activity Tolerance  Patient tolerated treatment well    Behavior During Therapy  [facility] for tasks assessed/performed       Past Medical History:  Diagnosis Date  . Anxiety   . Arthritis    "back" (06/05/2018)  . Breast cancer, right breast ([place]) 03/2018  . Chronic lower back pain    "if I don't do yoga" (06/05/2018)  . Depression   . Family history of breast cancer   . GERD (gastroesophageal reflux disease)   . 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 History of hiatal hernia   . Nephritis    "as a child" (06/05/2018)  . Osteoporosis   . SCC (squamous cell carcinoma)    "under my nose" (06/05/2018)  . UTI (urinary tract infection) 8/15  . Wears glasses     Past Surgical History:  Procedure Laterality Date  . BREAST BIOPSY Right 04/2018  . CATARACT EXTRACTION W/ INTRAOCULAR LENS  IMPLANT, BILATERAL Bilateral   . CESAREAN SECTION  1977  . COLONOSCOPY    . CYST REMOVAL NECK Left 03/03/2014   Procedure: LEFT NECK CYST EXCISION;  Surgeon: Pedro Earls, MD;  Location: [place];  Service: General;  Laterality: Left;  . DILATION AND CURETTAGE OF UTERUS    . EYE SURGERY     Strabismus  . MASTECTOMY Left 06/05/2018  . MASTECTOMY COMPLETE / SIMPLE W/ SENTINEL NODE BIOPSY Right 06/05/2018   AND BLUE DYE INJECTION RIGHT BREAST  . MASTECTOMY W/ SENTINEL NODE BIOPSY Bilateral 06/05/2018   Procedure: BILATERAL MASTECTOMIES WITH RIGHT SENTINEL LYMPH NODE  BIOPSY AND BLUE DYE INJECTION RIGHT BREAST;  Surgeon: Donnie Mesa, MD;  Location: [place];  Service: General;  Laterality: Bilateral;  . SQUAMOUS CELL CARCINOMA EXCISION     "under my nose"  . STRABISMUS SURGERY Bilateral 05/02/2018   Procedure: BILATERAL REPAIR STRABISMUS;  Surgeon: Lamonte Sakai, MD;  Location: [place];  Service: Ophthalmology;  Laterality: Bilateral;  . TONSILLECTOMY AND ADENOIDECTOMY    . UPPER GI ENDOSCOPY    . WISDOM TOOTH EXTRACTION      There were no vitals filed for this visit.  Subjective Assessment - 07/29/18 1032    Subjective  Pt goes to get measured for her compression shirt today.  She is using the chip packs under her arms at night and the compression seems to help     Pertinent History   Pt had bilateral mastectomy on nov. 21, with 0/3 nodes on the right  Pt will not have to have chemo and radiation. She notices that she has some decreased range of motion in right shoulder and hip with yoga and has back arthritis and osteoporosis     Patient Stated Goals  to get range of motion back as soon as she can     Currently in Pain?  No/denies  [place] Adult PT Treatment/Exercise - 07/29/18 0001      Exercises   Exercises  Other Exercises    Other Exercises   Began instruction in Strength ABC program basics and stretching sequence      Shoulder Exercises: Standing   Row  Strengthening;Right;Left;5 reps;Theraband    Theraband Level (Shoulder Row)  Level 2 (Red);Level 3 (Green)    Row Limitations  cues to keep pelvis in neutral and core engaged while doing exercise                   PT Long Term Goals - 07/24/18 1526      PT LONG TERM GOAL #1   Title  Pt will report pain across chest and in right shoulder is decreased to 4/10 at the most     Baseline  pt states she still has the buring sensation across the ches to about 3 or 4     Status  Achieved      PT LONG TERM GOAL #2   Title  Pt will  have bilateral shoulder ROM return to pre-op levels     Status  Achieved      PT LONG TERM GOAL #3   Title  Pt will be independent in a home exercise program     Time  6    Period  Weeks    Status  On-going      PT LONG TERM GOAL #4   Title  Pt will decrease Quick DASH score to < 20 indicating functional improvment in her arms     Baseline  63.64    Status  On-going            Plan - 07/29/18 1241    Clinical Impression Statement  Pt needing extra cues for posterior pelvic tilt and core engagement in exercise today and refined technique for rows with green theraband in door. She understood basics of Strength ABC progression and did well with stretches but will benefit from more instruction.  She will come one more session , then suspend PT for a couple of weeks while she puts her house on the market and prepares to move to [place] Impairments Affecting Rehab Potential  osteoporosis     PT Frequency  2x / week    PT Duration  6 weeks    PT Treatment/Interventions  ADLs/Self Care Home Management;Therapeutic exercise;Manual lymph drainage;Compression bandaging;Taping;Manual techniques;Passive range of motion;Scar mobilization    PT Next Visit Plan  Review Strength ABC progam stretches and teach core portion, check on Andrea garment As needed, continue bil shoulder ROM and myofascial release working towards pt tolerating yoga and swimming; review supine scapular series prn    Consulted and Agree with Plan of Care  Patient       Patient will benefit from skilled therapeutic intervention in order to improve the following deficits and impairments:  Postural dysfunction, Decreased range of motion, Impaired perceived functional ability, Impaired UE functional use, Decreased skin integrity, Decreased scar mobility, Decreased knowledge of precautions, Decreased activity tolerance, Pain  Visit Diagnosis: Aftercare following surgery for neoplasm  Stiffness of right shoulder  joint  Stiffness of left shoulder joint  Abnormal posture  Muscle weakness (generalized)  Acute pain of right shoulder     Problem List Patient Active Problem List   Diagnosis Date Noted  . Invasive lobular carcinoma of breast, stage 1, right ([place]) 06/05/2018  . Genetic testing 05/20/2018  . Family history  of breast cancer   . Malignant neoplasm of upper-outer quadrant of right breast in female, estrogen receptor positive ([place]) 05/08/2018  . Osteoporosis 12/14/2014  . Rash 12/28/2013  . Infected sebaceous cyst of skin 12/22/2013   Donato Heinz. Owens Shark PT  Norwood Levo 07/29/2018, 12:46 PM  [place], [place], [address] Phone: [phone]   Fax:  [phone]  Name: Lisa Yoder MRN: [id number] Date of Birth: Aug 03, 1950

## 2018-07-31 ENCOUNTER — Encounter: Payer: Self-pay | Admitting: Physical Therapy

## 2018-07-31 ENCOUNTER — Ambulatory Visit: Payer: Medicare Other | Admitting: Physical Therapy

## 2018-07-31 DIAGNOSIS — M25612 Stiffness of left shoulder, not elsewhere classified: Secondary | ICD-10-CM

## 2018-07-31 DIAGNOSIS — Z483 Aftercare following surgery for neoplasm: Secondary | ICD-10-CM

## 2018-07-31 DIAGNOSIS — M6281 Muscle weakness (generalized): Secondary | ICD-10-CM

## 2018-07-31 DIAGNOSIS — M25611 Stiffness of right shoulder, not elsewhere classified: Secondary | ICD-10-CM

## 2018-07-31 DIAGNOSIS — R293 Abnormal posture: Secondary | ICD-10-CM

## 2018-07-31 NOTE — Therapy (Signed)
[place], [place], [address] Phone: [phone]   Fax:  [phone]  Physical Therapy Treatment  Patient Details  Name: Lisa Yoder MRN: [id number] Date of Birth: 21-Aug-1950 Referring Provider (PT): Lindi Adie   Encounter Date: 07/31/2018  PT End of Session - 07/31/18 1340    Visit Number  9    Number of Visits  9    Date for PT Re-Evaluation  08/15/17    PT Start Time  1300    PT Stop Time  1345    PT Time Calculation (min)  45 min    Activity Tolerance  Patient tolerated treatment well    Behavior During Therapy  [facility] for tasks assessed/performed       Past Medical History:  Diagnosis Date  . Anxiety   . Arthritis    "back" (06/05/2018)  . Breast cancer, right breast ([place]) 03/2018  . Chronic lower back pain    "if I don't do yoga" (06/05/2018)  . Depression   . Family history of breast cancer   . GERD (gastroesophageal reflux disease)   . 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 History of hiatal hernia   . Nephritis    "as a child" (06/05/2018)  . Osteoporosis   . SCC (squamous cell carcinoma)    "under my nose" (06/05/2018)  . UTI (urinary tract infection) 8/15  . Wears glasses     Past Surgical History:  Procedure Laterality Date  . BREAST BIOPSY Right 04/2018  . CATARACT EXTRACTION W/ INTRAOCULAR LENS  IMPLANT, BILATERAL Bilateral   . CESAREAN SECTION  1977  . COLONOSCOPY    . CYST REMOVAL NECK Left 03/03/2014   Procedure: LEFT NECK CYST EXCISION;  Surgeon: Pedro Earls, MD;  Location: [place];  Service: General;  Laterality: Left;  . DILATION AND CURETTAGE OF UTERUS    . EYE SURGERY     Strabismus  . MASTECTOMY Left 06/05/2018  . MASTECTOMY COMPLETE / SIMPLE W/ SENTINEL NODE BIOPSY Right 06/05/2018   AND BLUE DYE INJECTION RIGHT BREAST  . MASTECTOMY W/ SENTINEL NODE BIOPSY Bilateral 06/05/2018   Procedure: BILATERAL MASTECTOMIES WITH RIGHT SENTINEL LYMPH NODE  BIOPSY AND BLUE DYE INJECTION RIGHT BREAST;  Surgeon: Donnie Mesa, MD;  Location: [place];  Service: General;  Laterality: Bilateral;  . SQUAMOUS CELL CARCINOMA EXCISION     "under my nose"  . STRABISMUS SURGERY Bilateral 05/02/2018   Procedure: BILATERAL REPAIR STRABISMUS;  Surgeon: Lamonte Sakai, MD;  Location: [place];  Service: Ophthalmology;  Laterality: Bilateral;  . TONSILLECTOMY AND ADENOIDECTOMY    . UPPER GI ENDOSCOPY    . WISDOM TOOTH EXTRACTION      There were no vitals filed for this visit.      [facility] PT Assessment - 07/31/18 0001      Observation/Other Assessments   Quick DASH   25           Quick Dash - 07/31/18 0001    Open a tight or new jar  Moderate difficulty    Do heavy household chores (wash walls, wash floors)  Moderate difficulty    Carry a shopping bag or briefcase  Mild difficulty    Wash your back  No difficulty    Use a knife to cut food  No difficulty    Recreational activities in which you take some force or impact through your arm, shoulder, or hand (golf, hammering, tennis)  Moderate  difficulty    During the past week, to what extent has your arm, shoulder or hand problem interfered with your normal social activities with family, friends, neighbors, or groups?  Slightly    During the past week, to what extent has your arm, shoulder or hand problem limited your work or other regular daily activities  Not at all    Arm, shoulder, or hand pain.  Mild    Tingling (pins and needles) in your arm, shoulder, or hand  Mild    Difficulty Sleeping  Mild difficulty    DASH Score  25 %             [facility] Adult PT Treatment/Exercise - 07/31/18 0001      Self-Care   Self-Care  Other Self-Care Comments    Other Self-Care Comments   gave pt 2 pieces of while foam to wear inside compression tank and pt states that felt good . she still has fullness at tright upper arm against body  provided small tg soft with deep fold at the top        Exercises   Exercises  Other Exercises    Other Exercises   completed instruction for the rest of the Strength ABC program and had return demonstration with notes on handout also,                   PT Long Term Goals - 07/31/18 1337      PT LONG TERM GOAL #1   Title  Pt will report pain across chest and in right shoulder is decreased to 4/10 at the most     Baseline  pt states she still has the buring sensation across the ches to about 3 or 4  pt states its improving every das    Status  Achieved      PT LONG TERM GOAL #2   Title  Pt will have bilateral shoulder ROM return to pre-op levels     Status  Achieved      PT LONG TERM GOAL #3   Title  Pt will be independent in a home exercise program     Baseline  07/31/2018 instructed in strength ABC     Period  Weeks    Status  On-going      PT LONG TERM GOAL #4   Title  Pt will decrease Quick DASH score to < 20 indicating functional improvment in her arms     Baseline  63.64 on eval, On  1/16 2020 : 25    Status  Achieved            Plan - 07/31/18 1340    Clinical Impression Statement  Pt reports she is doing better every day.  She has a compression tank top and is finding some relief  she has some pain and fullness in right upper arm, so small tg soft was issued to see if that will help.  Pt will be packing house to prepare to move, so will be suspend PT for a couple of weeks and call to make an appointment when she is ready to resume.     Rehab Potential  Good    Clinical Impairments Affecting Rehab Potential  osteoporosis     PT Treatment/Interventions  ADLs/Self Care Home Management;Therapeutic exercise;Manual lymph drainage;Compression bandaging;Taping;Manual techniques;Passive range of motion;Scar mobilization    PT Next Visit Plan  Reassess chest and arm circumference, answer questions, see if pt needs a compression sleeve from [facility],  discharge possible        Patient will benefit from skilled therapeutic  intervention in order to improve the following deficits and impairments:  Postural dysfunction, Decreased range of motion, Impaired perceived functional ability, Impaired UE functional use, Decreased skin integrity, Decreased scar mobility, Decreased knowledge of precautions, Decreased activity tolerance, Pain  Visit Diagnosis: Aftercare following surgery for neoplasm  Stiffness of right shoulder joint  Stiffness of left shoulder joint  Abnormal posture  Muscle weakness (generalized)     Problem List Patient Active Problem List   Diagnosis Date Noted  . Invasive lobular carcinoma of breast, stage 1, right ([place]) 06/05/2018  . Genetic testing 05/20/2018  . Family history of breast cancer   . Malignant neoplasm of upper-outer quadrant of right breast in female, estrogen receptor positive ([place]) 05/08/2018  . Osteoporosis 12/14/2014  . Rash 12/28/2013  . Infected sebaceous cyst of skin 12/22/2013   Donato Heinz. Owens Shark PT  Norwood Levo 07/31/2018, 1:54 PM  [place], [place], [address] Phone: [phone]   Fax:  [phone]  Name: Lisa Yoder MRN: [id number] Date of Birth: 09-26-1950

## 2018-09-04 ENCOUNTER — Ambulatory Visit: Payer: Medicare Other | Attending: Hematology and Oncology | Admitting: Physical Therapy

## 2018-09-04 ENCOUNTER — Encounter: Payer: Self-pay | Admitting: Physical Therapy

## 2018-09-04 DIAGNOSIS — M25611 Stiffness of right shoulder, not elsewhere classified: Secondary | ICD-10-CM | POA: Diagnosis present

## 2018-09-04 DIAGNOSIS — Z483 Aftercare following surgery for neoplasm: Secondary | ICD-10-CM | POA: Diagnosis not present

## 2018-09-04 DIAGNOSIS — M25612 Stiffness of left shoulder, not elsewhere classified: Secondary | ICD-10-CM

## 2018-09-04 DIAGNOSIS — M6281 Muscle weakness (generalized): Secondary | ICD-10-CM | POA: Diagnosis present

## 2018-09-04 DIAGNOSIS — R293 Abnormal posture: Secondary | ICD-10-CM | POA: Diagnosis present

## 2018-09-04 NOTE — Therapy (Addendum)
[place], [place], [address] Phone: [phone]   Fax:  [phone]  Physical Therapy Treatment  Patient Details  Name: Lisa Yoder MRN: [id number] Date of Birth: Jul 13, 1951 Referring Provider (PT): Gudena  Progress Note Reporting Period 06/26/2018  to 09/04/2018  See note below for Objective Data and Assessment of Progress/Goals.      Encounter Date: 09/04/2018  PT End of Session - 09/04/18 1936    Visit Number  10    Number of Visits  14    Date for PT Re-Evaluation  10/14/18    PT Start Time  1430    PT Stop Time  1515    PT Time Calculation (min)  45 min    Activity Tolerance  Patient tolerated treatment well    Behavior During Therapy  [facility] for tasks assessed/performed       Past Medical History:  Diagnosis Date  . Anxiety   . Arthritis    "back" (06/05/2018)  . Breast cancer, right breast ([place]) 03/2018  . Chronic lower back pain    "if I don't do yoga" (06/05/2018)  . Depression   . Family history of breast cancer   . GERD (gastroesophageal reflux disease)   . 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 History of hiatal hernia   . Nephritis    "as a child" (06/05/2018)  . Osteoporosis   . SCC (squamous cell carcinoma)    "under my nose" (06/05/2018)  . UTI (urinary tract infection) 8/15  . Wears glasses     Past Surgical History:  Procedure Laterality Date  . BREAST BIOPSY Right 04/2018  . CATARACT EXTRACTION W/ INTRAOCULAR LENS  IMPLANT, BILATERAL Bilateral   . CESAREAN SECTION  1977  . COLONOSCOPY    . CYST REMOVAL NECK Left 03/03/2014   Procedure: LEFT NECK CYST EXCISION;  Surgeon: Pedro Earls, MD;  Location: [facility] [place];  Service: General;  Laterality: Left;  . DILATION AND CURETTAGE OF UTERUS    . EYE SURGERY     Strabismus  . MASTECTOMY Left 06/05/2018  . MASTECTOMY COMPLETE / SIMPLE W/ SENTINEL NODE BIOPSY Right 06/05/2018   AND BLUE DYE INJECTION  RIGHT BREAST  . MASTECTOMY W/ SENTINEL NODE BIOPSY Bilateral 06/05/2018   Procedure: BILATERAL MASTECTOMIES WITH RIGHT SENTINEL LYMPH NODE BIOPSY AND BLUE DYE INJECTION RIGHT BREAST;  Surgeon: Donnie Mesa, MD;  Location: [facility];  Service: General;  Laterality: Bilateral;  . SQUAMOUS CELL CARCINOMA EXCISION     "under my nose"  . STRABISMUS SURGERY Bilateral 05/02/2018   Procedure: BILATERAL REPAIR STRABISMUS;  Surgeon: Lamonte Sakai, MD;  Location: [facility];  Service: Ophthalmology;  Laterality: Bilateral;  . TONSILLECTOMY AND ADENOIDECTOMY    . UPPER GI ENDOSCOPY    . WISDOM TOOTH EXTRACTION      There were no vitals filed for this visit.  Subjective Assessment - 09/04/18 1440    Subjective  Pt says she is still having some pain under both axilla is still present.  She is still having numbness in her axilla and has pain under her left incision. She is still doing yoga and is doing all the "stretch" things in yoga. She is wearing a compression tank top and did not order the Veva Holes for axillary compression as discussed earlier. She is considering getting her prostheses, but is not sure if she wants to go to [facility].  She has had a recent  loss of her sister and is packing up to leave her house of many years.  Pt is emotional at the thought of all the losses she has had.     Pertinent History   Pt had bilateral mastectomy on nov. 21, with 0/3 nodes on the right  Pt will not have to have chemo and radiation. She notices that she has some decreased range of motion in right shoulder and hip with yoga and has back arthritis and osteoporosis     Patient Stated Goals  to get range of motion back as soon as she can     Currently in Pain?  No/denies         [facility] PT Assessment - 09/04/18 0001      Assessment   Medical Diagnosis  right breast    Referring Provider (PT)  Georgiann Mohs Adult PT Treatment/Exercise - 09/04/18 0001       Self-Care   Self-Care  Other Self-Care Comments    Other Self-Care Comments   gave pt information about how to get knitted knockers and other options to go for breast prostheses.  Offerred lots of encouragement that pt is doing well and to continue with her yoga and exercise       Exercises   Exercises  Other Exercises    Other Exercises   reviewed Strenth ABC exercises with handout and made notes to help pt with her form as she is concerned she is not doing them right.  She was able to demonstrate good technique will all exercises wtih cues,.              PT Education - 09/04/18 1933    Education Details  reviewed Strenth ABC program. talked with pt about where to go for breast prostheses and knitted knockers    Person(s) Educated  Patient    Methods  Explanation    Comprehension  Verbalized understanding;Returned demonstration;Tactile cues required          PT Long Term Goals - 09/04/18 1943      PT LONG TERM GOAL #1   Title  Pt will report pain across chest and in right shoulder is decreased to 4/10 at the most     Status  Achieved      PT LONG TERM GOAL #2   Title  Pt will have bilateral shoulder ROM return to pre-op levels     Status  Achieved      PT LONG TERM GOAL #3   Title  Pt will be independent in a home exercise program     Baseline  07/31/2018 instructed in strength ABC     Time  6    Period  Weeks    Status  On-going      PT LONG TERM GOAL #4   Title  Pt will decrease Quick DASH score to < 20 indicating functional improvment in her arms     Baseline  63.64 on eval, On  1/16 2020 : 25    Time  6    Period  Weeks    Status  On-going            Plan - 09/04/18 1938    Clinical Impression Statement  Pt comes in today still distressed about discomfort in both axillae and difficutly with returning to previous functional level.  She has many questions about exercise despite written  handout with pitures.  She is still deciding about getting her  prostheis and if she needs more compression.  She will be moving into [place] in March and wants to return back for another check in after she moves in.  I offerred to talk with the personal trainer there about her exercise program as needed Renewal sent til the end of March      PT Frequency  --   intermittent visits as needed    PT Duration  6 weeks    PT Treatment/Interventions  ADLs/Self Care Home Management;Therapeutic exercise;Manual lymph drainage;Compression bandaging;Taping;Manual techniques;Passive range of motion;Scar mobilization    PT Next Visit Plan  Reassess chest and arm circumference, answer questions, see if pt needs a compression sleeve from [facility], discharge possible     Consulted and Agree with Plan of Care  Patient       Patient will benefit from skilled therapeutic intervention in order to improve the following deficits and impairments:     Visit Diagnosis: Aftercare following surgery for neoplasm - Plan: PT plan of care cert/re-cert  Stiffness of right shoulder joint - Plan: PT plan of care cert/re-cert  Stiffness of left shoulder joint - Plan: PT plan of care cert/re-cert  Abnormal posture - Plan: PT plan of care cert/re-cert  Muscle weakness (generalized) - Plan: PT plan of care cert/re-cert     Problem List Patient Active Problem List   Diagnosis Date Noted  . Invasive lobular carcinoma of breast, stage 1, right ([place]) 06/05/2018  . Genetic testing 05/20/2018  . Family history of breast cancer   . Malignant neoplasm of upper-outer quadrant of right breast in female, estrogen receptor positive ([place]) 05/08/2018  . Osteoporosis 12/14/2014  . Rash 12/28/2013  . Infected sebaceous cyst of skin 12/22/2013   Lisa Yoder PT   Norwood Levo 09/04/2018, 7:47 PM  [place], [place], [address] Phone: [phone]   Fax:  [phone]  Name: Lisa Yoder MRN:  [id number] Date of Birth: 04-22-1951  PHYSICAL THERAPY DISCHARGE SUMMARY  Visits from Start of Care: 10 Current functional level related to goals / functional outcomes: unknown   Remaining deficits: unknown   Education / Equipment: Home exercise lyphedema managment Plan: Patient agrees to discharge.  Patient goals were partially met. Patient is being discharged due to not returning since the last visit.  ?????    Maudry Diego, PT 10/06/18 2:43 PM

## 2018-09-04 NOTE — Patient Instructions (Signed)
First of all, check with your insurance company to see if provider is in network    [facility] (for wigs and compression sleeves / gloves/gauntlets )  [address] [place], [place] [address] [phone]  Will file some insurances --- call for appointment   [facility] (for mastectomy prosthetics and garments) [address] [place], [place] [address] [phone] Will file some insurances --- call for appointment  [facility]  [address]  , [place] [address] [phone] Lower extremity garments  [facility] [address] [place], [place]  [address] [phone]  Cathy Rubel ( Medicaid certified lymphedema fitter) [phone] [email or web address]  Melissa Meares  [facility]  [phone]  [facility] [address] [place], [place] [address] [phone]  Other Resources: National Lymphedema Network:  www.lymphnet.org www.K[employer]etraining.com for patient articles and self manual lymph drainage information www.lymphedemablog.com has informative articles.  www.compressionguru.com www.lymphedemaproducts.com www.brightlifedirect.com www.compressionguru.com 

## 2018-09-09 ENCOUNTER — Encounter: Payer: Medicare Other | Admitting: Physical Therapy

## 2018-10-22 NOTE — Progress Notes (Signed)
Patient ID: [name], female   DOB: [birth year], 68 y.o.   MRN: [id number]  Patient location: Home My location: Office  Referring Provider: Jonathon Jordan, MD  I connected with the patient on 10/23/18 at  9:40 AM EDT by a video enabled telemedicine application and verified that I am speaking with the correct person.   I discussed the limitations of evaluation and management by telemedicine and the availability of in person appointments. The patient expressed understanding and agreed to proceed.   Details of the encounter are shown below.  HPI  Lisa Yoder is a 68 y.o.-year-old female, returning for f/u for osteoporosis. Last visit 1 year ago. ObGyn: Dr. Dian Queen.  Since last visit, she was dx'ed with BrCA. She had bilateral mastectomy in 05/2018. On Tamoxifen now.  She moved to [place] recently.  No fractures since last visit. She had a fall on her knees when walking last Spring >> no fracture.  Reviewed and addended history:  She was diagnosed with osteoporosis in 2006.  She has a history of a fall on the deck in 2018, but no fractures.  Also, denies dizziness, vertigo, orthostasis.  Reviewed patient's DXA scan reports along with the patient: Date L1-L4 T score FN T score  05/28/2017 ([facility]) (L3): -3.3 (-0.9%) LFN: -2.8 (-2.2%*) RFN: -3.1  05/27/2015 () -3.0 LFN: -2.7 RFN: -3.1  05/25/2013 ([facility]) -3.5 (-10.4%* c/w 03/14/2009) LFN: -3.4 RFN: -3.5  03/14/2009 -2.8   03/03/2007 -2.7   04/13/2005 -2.5    She was on the following osteoporosis treatments: - Fosamax in 2006 >> for 1-2 years - Actonel >> for 2 more years.  She refused Prolia.  No skeletal pain-joint pain.  No history of vitamin D deficiency.  Reviewed her vitamin D levels: Lab Results  Component Value Date   VD25OH 37.68 10/22/2017   VD25OH 31.59 08/28/2016   VD25OH 43.57 09/02/2015   VD25OH 42 10/08/2014  In the past, she was on  5000 units vitamin D daily, but as vitamin D level was close to the lower limit of normal, I advised her to increase to 7000 units daily >> she was only taking 2000 units at last visit and we increased this to 4000 units.  She does weightbearing exercises >> going to PT.  She does yoga occasionally, now more walking.  In the past she was also doing water aerobics once a week, but not now due to the coronavirus pandemic.  She is not on high vitamin A doses.  No history of hypo-or hypercalcemia.  No hyperparathyroidism.  No kidney stones. Lab Results  Component Value Date   CALCIUM 9.2 06/06/2018   CALCIUM 9.3 05/28/2018   CALCIUM 9.6 10/22/2017   CALCIUM 10.1 08/28/2016   CALCIUM 9.9 10/08/2014   CALCIUM 10.2 02/15/2014   CALCIUM 8.2 (L) 12/29/2013   CALCIUM 9.4 12/28/2013   No thyrotoxicosis.  Reviewed TSH levels:  Lab Results  Component Value Date   TSH 2.471 10/08/2014   TSH 1.710 12/30/2013   No history of CKD.  Reviewed BUN/creatinine: Lab Results  Component Value Date   BUN 13 06/06/2018   CREATININE 0.84 06/06/2018   Menopause was at 68 years old.  She does have a family history of osteoporosis in her sister, who is on Forteo.  She also has a history of a small hiatal hernia and dysphagia.  ROS: Constitutional: no weight gain/no weight loss, + fatigue, no subjective hyperthermia, no subjective hypothermia Eyes: no blurry vision,  no xerophthalmia ENT: no sore throat, no nodules palpated in neck, + dysphagia, no odynophagia, no hoarseness Cardiovascular: no CP/no SOB/no palpitations/no leg swelling Respiratory: no cough/no SOB/no wheezing Gastrointestinal: no N/no V/no D/no C/no acid reflux Musculoskeletal: no muscle aches/no joint aches Skin: no rashes, no hair loss Neurological: no tremors/no numbness/no tingling/no dizziness  I reviewed pt's medications, allergies, PMH, social hx, family hx, and changes were documented in the history of present illness.  Otherwise, unchanged from my initial visit note.  Past Medical History:  Diagnosis Date  . Anxiety   . Arthritis    "back" (06/05/2018)  . Breast cancer, right breast ([place]) 03/2018  . Chronic lower back pain    "if I don't do yoga" (06/05/2018)  . Depression   . Family history of breast cancer   . GERD (gastroesophageal reflux disease)   . 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 History of hiatal hernia   . Nephritis    "as a child" (06/05/2018)  . Osteoporosis   . SCC (squamous cell carcinoma)    "under my nose" (06/05/2018)  . UTI (urinary tract infection) 8/15  . Wears glasses    Past Surgical History:  Procedure Laterality Date  . BREAST BIOPSY Right 04/2018  . CATARACT EXTRACTION W/ INTRAOCULAR LENS  IMPLANT, BILATERAL Bilateral   . CESAREAN SECTION  1977  . COLONOSCOPY    . CYST REMOVAL NECK Left 03/03/2014   Procedure: LEFT NECK CYST EXCISION;  Surgeon: Pedro Earls, MD;  Location: [facility];  Service: General;  Laterality: Left;  . DILATION AND CURETTAGE OF UTERUS    . EYE SURGERY     Strabismus  . MASTECTOMY Left 06/05/2018  . MASTECTOMY COMPLETE / SIMPLE W/ SENTINEL NODE BIOPSY Right 06/05/2018   AND BLUE DYE INJECTION RIGHT BREAST  . MASTECTOMY W/ SENTINEL NODE BIOPSY Bilateral 06/05/2018   Procedure: BILATERAL MASTECTOMIES WITH RIGHT SENTINEL LYMPH NODE BIOPSY AND BLUE DYE INJECTION RIGHT BREAST;  Surgeon: Donnie Mesa, MD;  Location: [facility];  Service: General;  Laterality: Bilateral;  . SQUAMOUS CELL CARCINOMA EXCISION     "under my nose"  . STRABISMUS SURGERY Bilateral 05/02/2018   Procedure: BILATERAL REPAIR STRABISMUS;  Surgeon: Lamonte Sakai, MD;  Location: [facility];  Service: Ophthalmology;  Laterality: Bilateral;  . TONSILLECTOMY AND ADENOIDECTOMY    . UPPER GI ENDOSCOPY    . WISDOM TOOTH EXTRACTION     History   Social History  . Marital Status: Married    Spouse Name: N/A  . Number of Children: 2   Occupational  History  .  speech language pathologist   Social History Main Topics  . Smoking status: Former Smoker    Types: Cigarettes    Quit date:  86   . Smokeless tobacco: Never Used  . Alcohol Use: Yes  . Drug Use: No   Current Outpatient Medications on File Prior to Visit  Medication Sig Dispense Refill  . Brimonidine Tartrate (LUMIFY) 0.025 % SOLN Place 1 drop into both eyes daily.     Marland Kitchen CALCIUM-MAGNESIUM PO Take 1 tablet by mouth daily.     . Cholecalciferol (VITAMIN D3) 50 MCG (2000 UT) capsule Take 2,000 Units by mouth 2 (two) times daily.     . Cyanocobalamin 5000 MCG TBDP Take 5,000 mcg by mouth daily.     Marland Kitchen desvenlafaxine (PRISTIQ) 50 MG 24 hr tablet Take 50 mg by mouth daily.   11  . Digestive Enzymes (DIGESTIVE ENZYME  PO) Take 1 capsule by mouth 3 (three) times daily.     . Hypromellose (ARTIFICIAL TEARS OP) Place 1 drop into both eyes 2 (two) times daily.     Marland Kitchen linaclotide (LINZESS) 290 MCG CAPS capsule Take 290 mcg by mouth every other day.     Marland Kitchen LORazepam (ATIVAN) 0.5 MG tablet Take 0.5 mg by mouth at bedtime.   0  . Magnesium Gluconate (MAG-G) 500 (27 Mg) MG TABS Take 500 mg by mouth daily.     . meloxicam (MOBIC) 15 MG tablet Take 15 mg by mouth daily.    . methocarbamol (ROBAXIN) 500 MG tablet Take 1 tablet (500 mg total) by mouth every 6 (six) hours as needed for muscle spasms. 10 tablet 0  . Multiple Vitamins-Minerals (PRESERVISION AREDS) CAPS Take 1 capsule by mouth 2 (two) times daily.     . Omega-3 Fatty Acids (OMEGA 3 PO) Take 1 capsule by mouth daily.     Marland Kitchen oxyCODONE (OXY IR/ROXICODONE) 5 MG immediate release tablet Take 1 tablet (5 mg total) by mouth every 4 (four) hours as needed for moderate pain. 12 tablet 0  . tamoxifen (NOLVADEX) 20 MG tablet Take 1 tablet (20 mg total) by mouth daily. (Patient taking differently: Take 10 mg by mouth daily. ) 90 tablet 3  . tobramycin-dexamethasone (TOBRADEX) ophthalmic ointment Place 1 application into both eyes 3 (three) times  daily. For one week    . traMADol (ULTRAM) 50 MG tablet Take 1 tablet (50 mg total) by mouth every 6 (six) hours as needed (mild pain). 15 tablet 0  . Turmeric 500 MG CAPS Take 500 mg by mouth daily.     . Wheat Dextrin (BENEFIBER PO) Take 2 each by mouth See admin instructions. Mix 2 tablespoons and drink once daily in the evening     No current facility-administered medications on file prior to visit.    Allergies  Allergen Reactions  . Bactrim [Sulfamethoxazole-Trimethoprim] Anaphylaxis, Hives and Rash   Family History  Problem Relation Age of Onset  . Breast cancer Mother 8  . Hypertension Father   . Mental illness Sister   . Cancer Maternal Grandfather        blood cancer?  . Breast cancer Cousin        dx under 50  . Cancer Paternal Aunt        type unk  . Cancer Other        type unk   PE: There were no vitals taken for this visit. There is no height or weight on file to calculate BMI. Wt Readings from Last 3 Encounters:  06/16/18 131 lb 14.4 oz (59.8 kg)  06/05/18 128 lb 14.4 oz (58.5 kg)  05/28/18 128 lb 14.4 oz (58.5 kg)   Constitutional:  in NAD  The physical exam was not performed (virtual visit).  Assessment: 1. Osteoporosis  Plan: 1. Osteoporosis  Likely menopausal, worse after stopping p.o. bisphosphonates, which she tolerated well.  She does have a history of hiatal hernia and dysphagia, though.    We reviewed together her latest bone density scan from 05/2017: T-scores were fairly stable, however, she does have significant osteoporosis.  She refused Prolia at last visit.  Since last visit, she was diagnosed with breast cancer and was started on tamoxifen.  We discussed that this is beneficial effect on the bones as opposed to aromatase inhibitors.  She continues to exercise and supplementing calcium and vitamin D.  At last visit, I advised her  to increase her vitamin D dose from 2000 to 4000 units daily as her vitamin D level was closer to the lower  limit of normal.  She continues on this dose now.  We also discussed at that time about increasing weightbearing exercises as she was only doing these once a week.  Since then, she started to work with a physical therapist and does more weightbearing exercises, less yoga, and more walking.  At this visit, we addressed again the need for medication and I again suggested either Reclast or Prolia.  However, we discussed about waiting until her next bone density scan which is due in 05/2019 and decide at that time.  She understands that she does have a high risk of fracture based on the absolute values of her T-scores.  We will need to check another BMP and vitamin D level when she returns to the clinic  I advised her to contact me in 05/2019 so I can order her next bone density scan and will discuss about it at our next visit  We will see her back in 7 mo   - time spent with the patient: 15 min, of which >50% was spent in obtaining information about her symptoms, reviewing her previous labs, evaluations, and treatments, counseling her about her condition (please see the discussed topics above), and developing a plan to further investigate and treat it; she had a number of questions which I addressed.  Philemon Kingdom, MD PhD [facility] Endocrinology

## 2018-10-23 ENCOUNTER — Encounter: Payer: Self-pay | Admitting: Internal Medicine

## 2018-10-23 ENCOUNTER — Other Ambulatory Visit: Payer: Self-pay

## 2018-10-23 ENCOUNTER — Ambulatory Visit (INDEPENDENT_AMBULATORY_CARE_PROVIDER_SITE_OTHER): Payer: Medicare Other | Admitting: Internal Medicine

## 2018-10-23 DIAGNOSIS — M810 Age-related osteoporosis without current pathological fracture: Secondary | ICD-10-CM | POA: Diagnosis not present

## 2018-10-23 NOTE — Patient Instructions (Addendum)
Please continue 4000 units vitamin D daily.  Please send me a message in 05/2018 so I can order your next bone density scan.  Please come back for a follow-up appointment in 7 months.

## 2019-02-18 ENCOUNTER — Other Ambulatory Visit: Payer: Self-pay

## 2019-02-18 ENCOUNTER — Encounter: Payer: Self-pay | Admitting: Internal Medicine

## 2019-02-18 ENCOUNTER — Non-Acute Institutional Stay: Payer: Medicare Other | Admitting: Internal Medicine

## 2019-02-18 VITALS — BP 130/84 | HR 85 | Temp 98.0°F | Ht 64.0 in | Wt 127.0 lb

## 2019-02-18 DIAGNOSIS — M810 Age-related osteoporosis without current pathological fracture: Secondary | ICD-10-CM

## 2019-02-18 DIAGNOSIS — F331 Major depressive disorder, recurrent, moderate: Secondary | ICD-10-CM

## 2019-02-18 DIAGNOSIS — M545 Low back pain, unspecified: Secondary | ICD-10-CM

## 2019-02-18 DIAGNOSIS — K5900 Constipation, unspecified: Secondary | ICD-10-CM

## 2019-02-18 DIAGNOSIS — G8929 Other chronic pain: Secondary | ICD-10-CM

## 2019-02-18 DIAGNOSIS — Q181 Preauricular sinus and cyst: Secondary | ICD-10-CM

## 2019-02-18 DIAGNOSIS — C50911 Malignant neoplasm of unspecified site of right female breast: Secondary | ICD-10-CM

## 2019-02-18 MED ORDER — DESVENLAFAXINE SUCCINATE ER 50 MG PO TB24: 50.0000 mg | ORAL_TABLET | Freq: Every day | ORAL | 3 refills | Status: AC

## 2019-02-18 MED ORDER — MELOXICAM 15 MG PO TABS: 15.0000 mg | ORAL_TABLET | Freq: Every day | ORAL | 1 refills | Status: DC

## 2019-02-18 MED ORDER — LORAZEPAM 0.5 MG PO TABS: 0.5000 mg | ORAL_TABLET | Freq: Every day | ORAL | 2 refills | Status: AC

## 2019-02-18 MED ORDER — LINACLOTIDE 290 MCG PO CAPS: 290.0000 ug | ORAL_CAPSULE | ORAL | 0 refills | Status: DC

## 2019-02-18 NOTE — Progress Notes (Signed)
Location:  [place] of Service:  Clinic (12)  Provider:   Code Status:  Goals of Care:  Advanced Directives 02/18/2019  Does Patient Have a Medical Advance Directive? Yes  Type of Advance Directive Living will;Healthcare Power of Attorney  Does patient want to make changes to medical advance directive? No - Patient declined  Copy of [place] in Chart? Yes - validated most recent copy scanned in chart (See row information)  Would patient like information on creating a medical advance directive? -  Pre-existing out of facility DNR order (yellow form or pink MOST form) -     Chief Complaint  Patient presents with  . Medical Management of Chronic Issues    new patient, discuss medications, urinary problem,weight gain   . Health Maintenance    hepatitis c screening, colonoscopy, pcv13 vaccine, mammogram    HPI: Patient is a 68 y.o. female seen today for medical management of chronic diseases.   Patient is here today to establish care  H/o Bilateral Mastectomy for right breast invasive lobular carcinoma She is on tamoxifen and continues to have mild side effects including menopausal symptoms and weight gain Follows with Dr. Lindi Adie  Also follows with Dr.Tsuei her general surgeon  H/o Osteoporosis Last DEXA scan was from 11/18 Has been on Fosamax and Actonel Refused Prolia due to side ffects Follows with Dr Cruzita Lederer  Chronic Constipation and Hemorrhoids Due for Colonoscopy by Dr Earlean Shawl Depression She follows with Dr Helane Rima Gynecology who has started her on Pristiq and it seems to help her. Low Back Pain with MRI in 2/19 showed Degenerative changes On Meloxicam Benign Cyst of Right Inner Ear Follows with Dr Melene Plan ENT with MRI every Year  Patient and her husband have recently moved in [facility].  She is independent.  Still drives and does not use any cane or walker.  Has had 2 falls in the past 1 year with no injuries   Past Medical  History:  Diagnosis Date  . Anxiety   . Arthritis    "back" (06/05/2018)  . Breast cancer, right breast ([place]) 03/2018  . Chronic lower back pain    "if I don't do yoga" (06/05/2018)  . Depression   . Family history of breast cancer   . GERD (gastroesophageal reflux disease)   . 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 History of hiatal hernia   . Nephritis    "as a child" (06/05/2018)  . Osteoporosis   . SCC (squamous cell carcinoma)    "under my nose" (06/05/2018)  . UTI (urinary tract infection) 8/15  . Wears glasses     Past Surgical History:  Procedure Laterality Date  . BREAST BIOPSY Right 04/2018  . CATARACT EXTRACTION W/ INTRAOCULAR LENS  IMPLANT, BILATERAL Bilateral   . CESAREAN SECTION  1977  . COLONOSCOPY    . CYST REMOVAL NECK Left 03/03/2014   Procedure: LEFT NECK CYST EXCISION;  Surgeon: Pedro Earls, MD;  Location: [facility];  Service: General;  Laterality: Left;  . DILATION AND CURETTAGE OF UTERUS    . EYE SURGERY     Strabismus  . MASTECTOMY Left 06/05/2018  . MASTECTOMY COMPLETE / SIMPLE W/ SENTINEL NODE BIOPSY Right 06/05/2018   AND BLUE DYE INJECTION RIGHT BREAST  . MASTECTOMY W/ SENTINEL NODE BIOPSY Bilateral 06/05/2018   Procedure: BILATERAL MASTECTOMIES WITH RIGHT SENTINEL LYMPH NODE BIOPSY AND BLUE DYE INJECTION RIGHT BREAST;  Surgeon: Donnie Mesa,  MD;  Location: [facility];  Service: General;  Laterality: Bilateral;  . SQUAMOUS CELL CARCINOMA EXCISION     "under my nose"  . STRABISMUS SURGERY Bilateral 05/02/2018   Procedure: BILATERAL REPAIR STRABISMUS;  Surgeon: Lamonte Sakai, MD;  Location: [facility];  Service: Ophthalmology;  Laterality: Bilateral;  . TONSILLECTOMY AND ADENOIDECTOMY    . UPPER GI ENDOSCOPY    . WISDOM TOOTH EXTRACTION      Allergies  Allergen Reactions  . Bactrim [Sulfamethoxazole-Trimethoprim] Anaphylaxis, Hives and Rash    Outpatient Encounter Medications as of 02/18/2019  Medication Sig  .  Brimonidine Tartrate (LUMIFY) 0.025 % SOLN Place 1 drop into both eyes daily.   Marland Kitchen CALCIUM-MAGNESIUM PO Take 1 tablet by mouth daily.   . Cholecalciferol (VITAMIN D3) 50 MCG (2000 UT) capsule Take 2,000 Units by mouth 2 (two) times daily.   Marland Kitchen desvenlafaxine (PRISTIQ) 50 MG 24 hr tablet Take 50 mg by mouth daily.   . Digestive Enzymes (DIGESTIVE ENZYME PO) Take 1 capsule by mouth 3 (three) times daily.   . Hypromellose (ARTIFICIAL TEARS OP) Place 1 drop into both eyes 2 (two) times daily.   Marland Kitchen linaclotide (LINZESS) 290 MCG CAPS capsule Take 290 mcg by mouth every other day.   Marland Kitchen LORazepam (ATIVAN) 0.5 MG tablet Take 0.5 mg by mouth at bedtime.   . Magnesium Gluconate (MAG-G) 500 (27 Mg) MG TABS Take 500 mg by mouth daily.   . meloxicam (MOBIC) 15 MG tablet Take 15 mg by mouth daily.  . Multiple Vitamins-Minerals (PRESERVISION AREDS) CAPS Take 1 capsule by mouth 2 (two) times daily.   . Omega-3 Fatty Acids (OMEGA 3 PO) Take 1 capsule by mouth daily.   . tamoxifen (NOLVADEX) 20 MG tablet Take 1 tablet (20 mg total) by mouth daily. (Patient taking differently: Take 15 mg by mouth daily. )  . Wheat Dextrin (BENEFIBER PO) Take 2 each by mouth See admin instructions. Mix 2 tablespoons and drink once daily in the evening  . [DISCONTINUED] methocarbamol (ROBAXIN) 500 MG tablet Take 1 tablet (500 mg total) by mouth every 6 (six) hours as needed for muscle spasms.  . Cyanocobalamin 5000 MCG TBDP Take 5,000 mcg by mouth daily.   . [DISCONTINUED] oxyCODONE (OXY IR/ROXICODONE) 5 MG immediate release tablet Take 1 tablet (5 mg total) by mouth every 4 (four) hours as needed for moderate pain.  . [DISCONTINUED] tobramycin-dexamethasone (TOBRADEX) ophthalmic ointment Place 1 application into both eyes 3 (three) times daily. For one week  . [DISCONTINUED] traMADol (ULTRAM) 50 MG tablet Take 1 tablet (50 mg total) by mouth every 6 (six) hours as needed (mild pain).  . [DISCONTINUED] Turmeric 500 MG CAPS Take 500 mg by  mouth daily.    No facility-administered encounter medications on file as of 02/18/2019.     Review of Systems:  Review of Systems  Review of Systems  Constitutional: Negative for activity change, appetite change, chills, diaphoresis, fatigue and fever.  HENT: Negative for mouth sores, postnasal drip, rhinorrhea, sinus pain and sore throat.   Respiratory: Negative for apnea, cough, chest tightness, shortness of breath and wheezing.   Cardiovascular: Negative for chest pain, palpitations and leg swelling.  Gastrointestinal: Negative for abdominal distention, abdominal pain, constipation, diarrhea, nausea and vomiting.  Genitourinary: Negative for dysuria and frequency.  Musculoskeletal: Negative for arthralgias, joint swelling and myalgias.  Skin: Negative for rash.  Neurological: Negative for dizziness, syncope, weakness, light-headedness and numbness.  Psychiatric/Behavioral: Negative for behavioral problems, confusion and sleep disturbance.  Health Maintenance  Topic Date Due  . Hepatitis C Screening  1950/08/17  . TETANUS/TDAP  09/13/1967  . COLONOSCOPY  09/12/1998  . PNA vac Low Risk Adult (1 of 2 - PCV13) 09/12/2013  . MAMMOGRAM  05/26/2015  . INFLUENZA VACCINE  02/14/2019  . DEXA SCAN  Completed    Physical Exam: Vitals:   02/18/19 1332  BP: 130/84  Pulse: 85  Temp: 98 F (36.7 C)  TempSrc: Oral  SpO2: 97%  Weight: 127 lb (57.6 kg)  Height: 5\' 4"  (1.626 m)   Body mass index is 21.8 kg/m. Physical Exam  Constitutional: Oriented to person, place, and time. Well-developed and well-nourished.  HENT:  Head: Normocephalic.  Mouth/Throat: Oropharynx is clear and moist.  Eyes: Pupils are equal, round, and reactive to light.  Neck: Neck supple.  Cardiovascular: Normal rate and normal heart sounds.  No murmur heard. Pulmonary/Chest: Effort normal and breath sounds normal. No respiratory distress. No wheezes. She has no rales.  Abdominal: Soft. Bowel sounds are  normal. No distension. There is no tenderness. There is no rebound.  Musculoskeletal: No edema.  Lymphadenopathy: none Neurological: Alert and oriented to person, place, and time. Gait was normal Skin: Skin is warm and dry.  Psychiatric: Normal mood and affect. Behavior is normal. Thought content normal.    Labs reviewed: Basic Metabolic Panel: Recent Labs    05/28/18 1318 06/05/18 1425 06/06/18 0313  NA 140  --  140  K 4.0  --  4.2  CL 103  --  109  CO2 27  --  25  GLUCOSE 105*  --  106*  BUN 17  --  13  CREATININE 1.00 0.96 0.84  CALCIUM 9.3  --  9.2   Liver Function Tests: Recent Labs    05/28/18 1318  AST 17  ALT 16  ALKPHOS 78  BILITOT 0.6  PROT 6.9  ALBUMIN 3.9   No results for input(s): LIPASE, AMYLASE in the last 8760 hours. No results for input(s): AMMONIA in the last 8760 hours. CBC: Recent Labs    05/28/18 1318 06/05/18 1425 06/06/18 0313  WBC 5.8 8.9 9.3  HGB 13.2 12.6 11.2*  HCT 44.1 40.2 35.2*  MCV 93.6 92.8 92.1  PLT 245 177 176   Lipid Panel: No results for input(s): CHOL, HDL, LDLCALC, TRIG, CHOLHDL, LDLDIRECT in the last 8760 hours. No results found for: HGBA1C  Procedures since last visit: No results found.  Assessment/Plan Invasive lobular carcinoma of breast, stage 1, right ([place]) - Plan:  Continue tamoxifen. Follow-up with surgeon and oncology  Osteoporosis - Plan:  I have told patient that I can order her bone scan due in 05/2019 She is not interested in any aggressive therapy at this time   Recurrent major depressive disorder ([place]) - Plan:  Continue on Pristiq and Ativan as needed  Chronic bilateral low back pain without sciatica - Plan:  Patient is going to reduce her dose of meloxicam to 7.5 mg Eventually plan is for her to taper herself off  Ear cysts - Plan:  Follow-up with MRI of her head due in 07/2019  Constipation, unspecified constipation type - Plan:  Follow-up due with Dr. Earlean Shawl for colonoscopy Continue on  Linzess every other day  Stress Urinary Incontinence Doing Kegel exercise   Labs/tests ordered:  Next appt:  05/21/2019  Patient will come for a follow-up in 3 months.  Total time spent in this patient care encounter was  45  minutes; greater than 50% of the  visit spent counseling patient and staff, reviewing records , Labs and coordinating care for problems addressed at this encounter.

## 2019-03-19 ENCOUNTER — Encounter: Payer: Self-pay | Admitting: Internal Medicine

## 2019-04-18 ENCOUNTER — Other Ambulatory Visit: Payer: Self-pay | Admitting: Internal Medicine

## 2019-05-21 ENCOUNTER — Other Ambulatory Visit: Payer: Self-pay | Admitting: Internal Medicine

## 2019-05-21 ENCOUNTER — Other Ambulatory Visit: Payer: Medicare Other

## 2019-05-21 ENCOUNTER — Other Ambulatory Visit: Payer: Self-pay

## 2019-05-21 DIAGNOSIS — G8929 Other chronic pain: Secondary | ICD-10-CM

## 2019-05-21 DIAGNOSIS — M545 Low back pain, unspecified: Secondary | ICD-10-CM

## 2019-05-21 DIAGNOSIS — M810 Age-related osteoporosis without current pathological fracture: Secondary | ICD-10-CM

## 2019-05-22 LAB — LIPID PANEL
Cholesterol: 194 mg/dL (ref <200)
HDL: 80 mg/dL (ref >=50)
LDL Cholesterol (Calc): 93 mg/dL (calc)
Non-HDL Cholesterol (Calc): 114 mg/dL (calc) (ref <130)
Total CHOL/HDL Ratio: 2.4 (calc) (ref <5.0)
Triglycerides: 111 mg/dL (ref <150)

## 2019-05-22 LAB — CBC WITH DIFFERENTIAL/PLATELET
Absolute Monocytes: 607 cells/uL (ref 200–950)
Basophils Absolute: 51 cells/uL (ref 0–200)
Basophils Relative: 1 %
Eosinophils Absolute: 224 cells/uL (ref 15–500)
Eosinophils Relative: 4.4 %
HCT: 40.2 % (ref 35.0–45.0)
Hemoglobin: 13.4 g/dL (ref 11.7–15.5)
Lymphs Abs: 1836 cells/uL (ref 850–3900)
MCH: 30 pg (ref 27.0–33.0)
MCHC: 33.3 g/dL (ref 32.0–36.0)
MCV: 90.1 fL (ref 80.0–100.0)
MPV: 11.8 fL (ref 7.5–12.5)
Monocytes Relative: 11.9 %
Neutro Abs: 2382 cells/uL (ref 1500–7800)
Neutrophils Relative %: 46.7 %
Platelets: 232 10*3/uL (ref 140–400)
RBC: 4.46 10*6/uL (ref 3.80–5.10)
RDW: 12.2 % (ref 11.0–15.0)
Total Lymphocyte: 36 %
WBC: 5.1 10*3/uL (ref 3.8–10.8)

## 2019-05-22 LAB — COMPLETE METABOLIC PANEL WITH GFR
AG Ratio: 1.5 (calc) (ref 1.0–2.5)
ALT: 18 U/L (ref 6–29)
AST: 19 U/L (ref 10–35)
Albumin: 4.2 g/dL (ref 3.6–5.1)
Alkaline phosphatase (APISO): 55 U/L (ref 37–153)
BUN/Creatinine Ratio: 17 (calc) (ref 6–22)
BUN: 17 mg/dL (ref 7–25)
CO2: 28 mmol/L (ref 20–32)
Calcium: 9.8 mg/dL (ref 8.6–10.4)
Chloride: 106 mmol/L (ref 98–110)
Creat: 0.99 mg/dL — ABNORMAL HIGH (ref 0.60–0.93)
GFR, Est African American: 67 mL/min/{1.73_m2} (ref >=60)
GFR, Est Non African American: 58 mL/min/{1.73_m2} — ABNORMAL LOW (ref >=60)
Globulin: 2.8 g/dL (calc) (ref 1.9–3.7)
Glucose, Bld: 81 mg/dL (ref 65–99)
Potassium: 4.7 mmol/L (ref 3.5–5.3)
Sodium: 141 mmol/L (ref 135–146)
Total Bilirubin: 0.5 mg/dL (ref 0.2–1.2)
Total Protein: 7 g/dL (ref 6.1–8.1)

## 2019-05-22 LAB — TSH: TSH: 4.43 mIU/L (ref 0.40–4.50)

## 2019-05-22 LAB — VITAMIN D 25 HYDROXY (VIT D DEFICIENCY, FRACTURES): Vit D, 25-Hydroxy: 31 ng/mL (ref 30–100)

## 2019-05-27 ENCOUNTER — Other Ambulatory Visit: Payer: Self-pay

## 2019-05-27 ENCOUNTER — Telehealth: Payer: Self-pay | Admitting: Internal Medicine

## 2019-05-27 ENCOUNTER — Non-Acute Institutional Stay: Payer: Medicare Other | Admitting: Internal Medicine

## 2019-05-27 ENCOUNTER — Encounter: Payer: Self-pay | Admitting: Internal Medicine

## 2019-05-27 VITALS — BP 144/90 | HR 78 | Temp 97.6°F | Ht 64.0 in | Wt 135.4 lb

## 2019-05-27 DIAGNOSIS — C50911 Malignant neoplasm of unspecified site of right female breast: Secondary | ICD-10-CM

## 2019-05-27 DIAGNOSIS — M810 Age-related osteoporosis without current pathological fracture: Secondary | ICD-10-CM | POA: Diagnosis not present

## 2019-05-27 DIAGNOSIS — E559 Vitamin D deficiency, unspecified: Secondary | ICD-10-CM | POA: Diagnosis not present

## 2019-05-27 DIAGNOSIS — M545 Low back pain: Secondary | ICD-10-CM

## 2019-05-27 DIAGNOSIS — G8929 Other chronic pain: Secondary | ICD-10-CM

## 2019-05-27 NOTE — Telephone Encounter (Signed)
Patient called re: an order and scheduling for patient to have a bone density test. Please call patient at ph# [phone] to advise.

## 2019-05-27 NOTE — Progress Notes (Signed)
Location: [place] of Service:  Clinic (12)  Provider:   Code Status:  Goals of Care:  Advanced Directives 02/18/2019  Does Patient Have a Medical Advance Directive? Yes  Type of Advance Directive Living will;Healthcare Power of Attorney  Does patient want to make changes to medical advance directive? No - Patient declined  Copy of [place] in Chart? Yes - validated most recent copy scanned in chart (See row information)  Would patient like information on creating a medical advance directive? -  Pre-existing out of facility DNR order (yellow form or pink MOST form) -     Chief Complaint  Patient presents with  . Medical Management of Chronic Issues    3 month follow up  . Health Maintenance    Hep C screen, TDAP, Colonoscopy scheduled for Dec., PCV13, patient has had a mastectomy    HPI: Patient is a 68 y.o. female seen today for an acute visit for Follow up of her Multiple issues and discuss her Blood Work H/o Bilateral Mastectomy for right breast invasive lobular carcinoma Follows with Dr. Lindi Adie  Also follows with Dr.Tsuei her general surgeon On Tamoxifen But takes 15 mg QD Continues to have some issues like LE swelling and Weight gain  H/o Osteoporosis Last DEXA scan was from 11/18 Has been on Fosamax and Actonel Refused Prolia due to side ffects Has DEXA scheduled with Dr Cruzita Lederer  Chronic Constipation and Hemorrhoids Has Colonoscopy schedule with [facility] Depression She follows with Dr Helane Rima Gynecology who has started her on Pristiq and it seems to help her. Also on Low dose Lorazepam Low Back Pain with MRI in 2/19 showed Degenerative changes On Meloxicam Benign Cyst of Right Inner Ear Follows with Dr Melene Plan ENT with MRI every Year  Feeling Good. Had no Acute complains. Patient and her husband have recently moved in [facility].  She is independent.  Still drives and does not use any cane or walker.  Has had 2 falls in the  past 1 year with no injuries Her son just got diagnosed with Colon Cancer and is getting Chemo   Past Medical History:  Diagnosis Date  . Anxiety   . Arthritis    "back" (06/05/2018)  . Breast cancer, right breast ([place]) 03/2018  . Chronic lower back pain    "if I don't do yoga" (06/05/2018)  . Depression   . Family history of breast cancer   . GERD (gastroesophageal reflux disease)   . 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 History of hiatal hernia   . Macular degeneration    Per [facility] New Patient Packet   . Nephritis    "as a child" (06/05/2018)  . Osteoporosis   . SCC (squamous cell carcinoma)    "under my nose" (06/05/2018)  . Strabismus    Per [facility] new patient packet   . UTI (urinary tract infection) 8/15  . Wears glasses     Past Surgical History:  Procedure Laterality Date  . BREAST BIOPSY Right 04/2018  . CATARACT EXTRACTION W/ INTRAOCULAR LENS  IMPLANT, BILATERAL Bilateral   . CESAREAN SECTION  1977  . COLONOSCOPY    . CYST REMOVAL NECK Left 03/03/2014   Procedure: LEFT NECK CYST EXCISION;  Surgeon: Pedro Earls, MD;  Location: [facility];  Service: General;  Laterality: Left;  . DILATION AND CURETTAGE OF UTERUS    . EYE SURGERY     Strabismus  .  MASTECTOMY Left 06/05/2018  . MASTECTOMY COMPLETE / SIMPLE W/ SENTINEL NODE BIOPSY Right 06/05/2018   AND BLUE DYE INJECTION RIGHT BREAST  . MASTECTOMY W/ SENTINEL NODE BIOPSY Bilateral 06/05/2018   Procedure: BILATERAL MASTECTOMIES WITH RIGHT SENTINEL LYMPH NODE BIOPSY AND BLUE DYE INJECTION RIGHT BREAST;  Surgeon: Donnie Mesa, MD;  Location: [facility];  Service: General;  Laterality: Bilateral;  . SQUAMOUS CELL CARCINOMA EXCISION     "under my nose"  . STRABISMUS SURGERY Bilateral 05/02/2018   Procedure: BILATERAL REPAIR STRABISMUS;  Surgeon: Lamonte Sakai, MD;  Location: [facility];  Service: Ophthalmology;  Laterality: Bilateral;  . TONSILLECTOMY AND ADENOIDECTOMY    . UPPER GI ENDOSCOPY     . WISDOM TOOTH EXTRACTION      Allergies  Allergen Reactions  . Bactrim [Sulfamethoxazole-Trimethoprim] Anaphylaxis, Hives and Rash    Outpatient Encounter Medications as of 05/27/2019  Medication Sig  . b complex vitamins tablet Take 1 tablet by mouth daily. 100 mg  . Brimonidine Tartrate (LUMIFY) 0.025 % SOLN Place 1 drop into both eyes daily.   Marland Kitchen CALCIUM-MAGNESIUM PO Take 1 tablet by mouth daily.   . Cholecalciferol (VITAMIN D3) 50 MCG (2000 UT) capsule Take 2,000 Units by mouth 2 (two) times daily.   Marland Kitchen desvenlafaxine (PRISTIQ) 50 MG 24 hr tablet Take 1 tablet (50 mg total) by mouth daily.  . Digestive Enzymes (DIGESTIVE ENZYME PO) Take 1 capsule by mouth 3 (three) times daily.   . Hypromellose (ARTIFICIAL TEARS OP) Place 1 drop into both eyes 2 (two) times daily.   Marland Kitchen Ketotifen Fumarate (ALAWAY OP) Apply to eye. As needed for allergies  . LINZESS 290 MCG CAPS capsule TAKE 1 CAPSULE EVERY OTHER DAY.  . LORazepam (ATIVAN) 0.5 MG tablet Take 0.5 mg by mouth at bedtime.  . Magnesium Gluconate (MAG-G) 500 (27 Mg) MG TABS Take 500 mg by mouth daily.   . meloxicam (MOBIC) 15 MG tablet TAKE 1 TABLET ONCE DAILY.  . Multiple Vitamins-Minerals (PRESERVISION AREDS) CAPS Take 1 capsule by mouth 2 (two) times daily.   . Omega-3 Fatty Acids (OMEGA 3 PO) Take 1 capsule by mouth daily.   . SELENIUM PO Take 250 mg by mouth daily.  . sodium chloride (OCEAN) 0.65 % SOLN nasal spray Place 1 spray into both nostrils 2 (two) times daily as needed for congestion.  . Tamoxifen Citrate (NOLVADEX PO) Take 15 mg by mouth daily.  . [DISCONTINUED] Cyanocobalamin 5000 MCG TBDP Take 5,000 mcg by mouth daily.   . [DISCONTINUED] tamoxifen (NOLVADEX) 20 MG tablet Take 1 tablet (20 mg total) by mouth daily. (Patient taking differently: Take 15 mg by mouth daily. )  . [DISCONTINUED] Wheat Dextrin (BENEFIBER PO) Take 2 each by mouth See admin instructions. Mix 2 tablespoons and drink once daily in the evening   No  facility-administered encounter medications on file as of 05/27/2019.     Review of Systems:  Review of Systems  Review of Systems  Constitutional: Negative for activity change, appetite change, chills, diaphoresis, fatigue and fever.  HENT: Negative for mouth sores, postnasal drip, rhinorrhea, sinus pain and sore throat.   Respiratory: Negative for apnea, cough, chest tightness, shortness of breath and wheezing.   Cardiovascular: Negative for chest pain, palpitations and leg swelling.  Gastrointestinal: Negative for abdominal distention, abdominal pain, constipation, diarrhea, nausea and vomiting.  Genitourinary: Negative for dysuria and frequency.  Musculoskeletal: Negative for arthralgias, joint swelling and myalgias.  Skin: Negative for rash.  Neurological: Negative for dizziness, syncope,  weakness, light-headedness and numbness.  Psychiatric/Behavioral: Negative for behavioral problems, confusion and sleep disturbance.     Health Maintenance  Topic Date Due  . Hepatitis C Screening  09[birth year]  . TETANUS/TDAP  09/13/1967  . COLONOSCOPY  09/12/1998  . PNA vac Low Risk Adult (1 of 2 - PCV13) 09/12/2013  . INFLUENZA VACCINE  Completed  . DEXA SCAN  Completed  . MAMMOGRAM  Discontinued    Physical Exam: Vitals:   05/27/19 1344  BP: (!) 144/90  Pulse: 78  Temp: 97.6 F (36.4 C)  SpO2: 93%  Weight: 135 lb 6.4 oz (61.4 kg)  Height: 5\' 4"  (1.626 m)   Body mass index is 23.24 kg/m. Physical Exam  Constitutional: Oriented to person, place, and time. Well-developed and well-nourished.  HENT:  Head: Normocephalic.  Mouth/Throat: Oropharynx is clear and moist.  Eyes: Pupils are equal, round, and reactive to light.  Neck: Neck supple.  Cardiovascular: Normal rate and normal heart sounds.  No murmur heard. Pulmonary/Chest: Effort normal and breath sounds normal. No respiratory distress. No wheezes. She has no rales.  Abdominal: Soft. Bowel sounds are normal. No distension.  There is no tenderness. There is no rebound.  Musculoskeletal: No edema.  Lymphadenopathy: none Neurological: Alert and oriented to person, place, and time. Walk in very steady No Focal deficits Skin: Skin is warm and dry.  Psychiatric: Normal mood and affect. Behavior is normal. Thought content normal.    Labs reviewed: Basic Metabolic Panel: Recent Labs    05/28/18 1318 06/05/18 1425 06/06/18 0313 05/20/19 1623  NA 140  --  140 141  K 4.0  --  4.2 4.7  CL 103  --  109 106  CO2 27  --  25 28  GLUCOSE 105*  --  106* 81  BUN 17  --  13 17  CREATININE 1.00 0.96 0.84 0.99*  CALCIUM 9.3  --  9.2 9.8  TSH  --   --   --  4.43   Liver Function Tests: Recent Labs    05/28/18 1318 05/20/19 1623  AST 17 19  ALT 16 18  ALKPHOS 78  --   BILITOT 0.6 0.5  PROT 6.9 7.0  ALBUMIN 3.9  --    No results for input(s): LIPASE, AMYLASE in the last 8760 hours. No results for input(s): AMMONIA in the last 8760 hours. CBC: Recent Labs    06/05/18 1425 06/06/18 0313 05/20/19 1623  WBC 8.9 9.3 5.1  NEUTROABS  --   --  2,382  HGB 12.6 11.2* 13.4  HCT 40.2 35.2* 40.2  MCV 92.8 92.1 90.1  PLT 177 176 232   Lipid Panel: Recent Labs    05/20/19 1623  CHOL 194  HDL 80  LDLCALC 93  TRIG 111  CHOLHDL 2.4   No results found for: HGBA1C  Procedures since last visit: No results found.  Assessment/Plan  Invasive lobular carcinoma of breast, stage 1, right ([place]) - Plan:  Tolerating Tamoxifen Follow up with her Oncology  Osteoporosis - Plan:  Will follow with her Rheumatology for DEXA   Recurrent major depressive disorder ([place]) - Plan:  Continue on Pristiq and Ativan as needed Prescribed By Dr Candelaria Celeste Can change the prescription to [place]  Chronic bilateral low back pain without sciatica - Plan:  Patient is going to reduce her dose of meloxicam to 7.5 mg Eventually plan is for her to taper herself off  Ear cysts - Plan:  Follow-up with MRI of her head due in 07/2019  Constipation, unspecified constipation type - Plan:  Follow-up due with [facility] colonoscopy Continue on Linzess every other day  Stress Urinary Incontinence Doing Kegel exercise Vit D Def D/W the patient to increase her Vit d to 5000units Mild weight gain Patient is going to cut back her bread and increase her Swimming days to 3/week    Labs/tests ordered:  * No order type specified * Next appt:  Visit date not found

## 2019-05-28 ENCOUNTER — Other Ambulatory Visit: Payer: Self-pay | Admitting: Internal Medicine

## 2019-05-28 DIAGNOSIS — M810 Age-related osteoporosis without current pathological fracture: Secondary | ICD-10-CM

## 2019-05-28 NOTE — Telephone Encounter (Signed)
OK, I ordered it to be done at [facility]. Can you please schedule this for her?  Last was 05/28/2017.

## 2019-05-28 NOTE — Telephone Encounter (Signed)
LM for patient on identified VM.

## 2019-06-01 ENCOUNTER — Other Ambulatory Visit: Payer: Self-pay | Admitting: Internal Medicine

## 2019-06-01 NOTE — Telephone Encounter (Signed)
Spoke with patient to schedule appointment within the next 30 days to have medications reviewed and sign an Non-Opioid Treatment Contract required for for all controlled substances.    Request sent to Virgie Dad, MD to review [place] Database and approve if necessary

## 2019-06-14 ENCOUNTER — Other Ambulatory Visit: Payer: Self-pay | Admitting: Internal Medicine

## 2019-06-21 NOTE — Progress Notes (Signed)
Patient Care Team: Virgie Dad, MD as PCP - General (Internal Medicine) Ronnette Juniper, MD as Consulting Physician (Gastroenterology) Phylliss Bob, MD as Consulting Physician (Orthopedic Surgery) Nicholas Lose, MD as Consulting Physician (Hematology and Oncology) Dian Queen, MD as Consulting Physician (Obstetrics and Gynecology) Marygrace Drought, MD as Consulting Physician (Ophthalmology) Leta Baptist, MD as Consulting Physician (Otolaryngology)  DIAGNOSIS:    ICD-10-CM   1. Malignant neoplasm of upper-outer quadrant of right breast in female, estrogen receptor positive ([place])  C50.411    Z17.0     SUMMARY OF ONCOLOGIC HISTORY: Oncology History  Malignant neoplasm of upper-outer quadrant of right breast in female, estrogen receptor positive ([place])  05/05/2018 Initial Diagnosis   Screening detected right breast mass, MRI revealed UIQ 1.5 x 0.8 x 0.8 cm mass.  Additional masses 5 mm size and 7 mm size and several smaller enhancing masses in the right breast, biopsy of the 1.5 cm mass invasive lobular cancer with LCIS ER 20%, PR 20%, Ki-67 less than 1%, HER-2 negative; biopsy of additional masses showed ALH and ILC with LCIS ER 100%, PR 70%, Ki-67 5%, HER-2 1+ negative   05/08/2018 -  Anti-estrogen oral therapy   Tamoxifen, plan 5-10 years   06/05/2018 Surgery   Right mastectomy: Invasive lobular carcinoma, multifocal, 1.2 cm is the largest tumor, grade 1, margins negative, 0/1 lymph node, ER 100%, PR 70%, HER-2 -1+, Ki-67 5% T1c N0 stage Ia Left mastectomy: Benign   06/18/2018 Cancer Staging   Staging form: Breast, AJCC 8th Edition - Pathologic: Stage IA (pT1c(m), pN0, cM0, G1, ER+, PR+, HER2-) - Signed by Gardenia Phlegm, NP on 06/18/2018   06/19/2018 Oncotype testing   Oncotype: score of 11 with 3% chance of distant recurrence in 9 years with tamoxifen alone     CHIEF COMPLIANT: Follow-up of right breast cancer on tamoxifen  INTERVAL HISTORY: Lisa Yoder is a 68 y.o. with above-mentioned history of right breast cancer who underwent bilateral mastectomies and is currently on tamoxifen. She presents to the clinic today for annual follow-up.   REVIEW OF SYSTEMS:   Constitutional: Denies fevers, chills or abnormal weight loss Eyes: Denies blurriness of vision Ears, nose, mouth, throat, and face: Denies mucositis or sore throat Respiratory: Denies cough, dyspnea or wheezes Cardiovascular: Denies palpitation, chest discomfort Gastrointestinal: Denies nausea, heartburn or change in bowel habits Skin: Denies abnormal skin rashes Lymphatics: Denies new lymphadenopathy or easy bruising Neurological: Denies numbness, tingling or new weaknesses Behavioral/Psych: Mood is stable, no new changes  Extremities: No lower extremity edema Breast: denies any pain or lumps or nodules in either breasts All other systems were reviewed with the patient and are negative.  I have reviewed the past medical history, past surgical history, social history and family history with the patient and they are unchanged from previous note.  ALLERGIES:  is allergic to bactrim [sulfamethoxazole-trimethoprim].  MEDICATIONS:  Current Outpatient Medications  Medication Sig Dispense Refill  . b complex vitamins tablet Take 1 tablet by mouth daily. 100 mg    . Brimonidine Tartrate (LUMIFY) 0.025 % SOLN Place 1 drop into both eyes daily.     Marland Kitchen CALCIUM-MAGNESIUM PO Take 1 tablet by mouth daily.     . Cholecalciferol (VITAMIN D3) 50 MCG (2000 UT) capsule Take 2,000 Units by mouth 2 (two) times daily.     Marland Kitchen desvenlafaxine (PRISTIQ) 50 MG 24 hr tablet Take 1 tablet (50 mg total) by mouth daily. 30 tablet 3  . Digestive Enzymes (DIGESTIVE ENZYME  PO) Take 1 capsule by mouth 3 (three) times daily.     . Hypromellose (ARTIFICIAL TEARS OP) Place 1 drop into both eyes 2 (two) times daily.     Marland Kitchen Ketotifen Fumarate (ALAWAY OP) Apply to eye. As needed for allergies    . LINZESS 290 MCG CAPS  capsule TAKE 1 CAPSULE EVERY OTHER DAY. 45 capsule 1  . LORazepam (ATIVAN) 0.5 MG tablet TAKE ONE TABLET AT BEDTIME. 30 tablet 0  . Magnesium Gluconate (MAG-G) 500 (27 Mg) MG TABS Take 500 mg by mouth daily.     . meloxicam (MOBIC) 15 MG tablet TAKE 1 TABLET ONCE DAILY. 30 tablet 5  . Multiple Vitamins-Minerals (PRESERVISION AREDS) CAPS Take 1 capsule by mouth 2 (two) times daily.     . Omega-3 Fatty Acids (OMEGA 3 PO) Take 1 capsule by mouth daily.     . SELENIUM PO Take 250 mg by mouth daily.    . sodium chloride (OCEAN) 0.65 % SOLN nasal spray Place 1 spray into both nostrils 2 (two) times daily as needed for congestion.    . Tamoxifen Citrate (NOLVADEX PO) Take 15 mg by mouth daily.     No current facility-administered medications for this visit.     PHYSICAL EXAMINATION: ECOG PERFORMANCE STATUS: 1 - Symptomatic but completely ambulatory  There were no vitals filed for this visit. There were no vitals filed for this visit.  GENERAL: alert, no distress and comfortable SKIN: skin color, texture, turgor are normal, no rashes or significant lesions EYES: normal, Conjunctiva are pink and non-injected, sclera clear OROPHARYNX: no exudate, no erythema and lips, buccal mucosa, and tongue normal  NECK: supple, thyroid normal size, non-tender, without nodularity LYMPH: no palpable lymphadenopathy in the cervical, axillary or inguinal LUNGS: clear to auscultation and percussion with normal breathing effort HEART: regular rate & rhythm and no murmurs and no lower extremity edema ABDOMEN: abdomen soft, non-tender and normal bowel sounds MUSCULOSKELETAL: no cyanosis of digits and no clubbing  NEURO: alert & oriented x 3 with fluent speech, no focal motor/sensory deficits EXTREMITIES: No lower extremity edema BREAST: No palpable masses or nodules in either right or left breasts. No palpable axillary supraclavicular or infraclavicular adenopathy no breast tenderness or nipple discharge. (exam  performed in the presence of a chaperone)  LABORATORY DATA:  I have reviewed the data as listed CMP Latest Ref Rng & Units 05/20/2019 06/06/2018 06/05/2018  Glucose 65 - 99 mg/dL 81 106(H) -  BUN 7 - 25 mg/dL 17 13 -  Creatinine 0.60 - 0.93 mg/dL 0.99(H) 0.84 0.96  Sodium 135 - 146 mmol/L 141 140 -  Potassium 3.5 - 5.3 mmol/L 4.7 4.2 -  Chloride 98 - 110 mmol/L 106 109 -  CO2 20 - 32 mmol/L 28 25 -  Calcium 8.6 - 10.4 mg/dL 9.8 9.2 -  Total Protein 6.1 - 8.1 g/dL 7.0 - -  Total Bilirubin 0.2 - 1.2 mg/dL 0.5 - -  Alkaline Phos 38 - 126 U/L - - -  AST 10 - 35 U/L 19 - -  ALT 6 - 29 U/L 18 - -    Lab Results  Component Value Date   WBC 5.1 05/20/2019   HGB 13.4 05/20/2019   HCT 40.2 05/20/2019   MCV 90.1 05/20/2019   PLT 232 05/20/2019   NEUTROABS 2,382 05/20/2019    ASSESSMENT & PLAN:  Malignant neoplasm of upper-outer quadrant of right breast in female, estrogen receptor positive ([place]) 06/05/2018: Bilateral mastectomies: Right mastectomy: Invasive lobular  carcinoma, multifocal, 1.2 cm is the largest tumor, grade 1, margins negative, 0/1 lymph node, ER 100%, PR 70%, HER-2 -1+, Ki-67 5% T1c N0 stage Ia Left mastectomy: Benign Oncotype DX: Score 11, ROR 3%  Current treatment: Tamoxifen started January 2020 patient is able to tolerate 15 mg only Tamoxifen toxicities: Severe hot flashes at 20 mg but she is able to tolerate 15 mg.  I sent a new prescription for 1/2 tablets of the 10 mg dosage of tamoxifen.  Breast cancer surveillance: 1.  Chest wall examination no palpable lumps or nodules. patient had previous bilateral mastectomy 2.  No role of imaging studies Patient son was diagnosed with colon cancer and is undergoing adjuvant chemotherapy. Patient's husband has had health issues.  Patient's father has multiple myeloma at 47.  Return to clinic in 1 year for follow-up No orders of the defined types were placed in this encounter.  The patient has a good understanding of  the overall plan. she agrees with it. she will call with any problems that may develop before the next visit here.  Nicholas Lose, MD 06/22/2019  Julious Oka Dorshimer, am acting as scribe for Dr. Nicholas Lose.  I have reviewed the above documentation for accuracy and completeness, and I agree with the above.

## 2019-06-22 ENCOUNTER — Inpatient Hospital Stay: Payer: Medicare Other | Attending: Hematology and Oncology | Admitting: Hematology and Oncology

## 2019-06-22 ENCOUNTER — Other Ambulatory Visit: Payer: Self-pay

## 2019-06-22 DIAGNOSIS — Z170 Estrogen receptor positive status [ER+]: Secondary | ICD-10-CM | POA: Diagnosis not present

## 2019-06-22 DIAGNOSIS — Z79810 Long term (current) use of selective estrogen receptor modulators (SERMs): Secondary | ICD-10-CM | POA: Insufficient documentation

## 2019-06-22 DIAGNOSIS — N951 Menopausal and female climacteric states: Secondary | ICD-10-CM | POA: Diagnosis not present

## 2019-06-22 DIAGNOSIS — C50411 Malignant neoplasm of upper-outer quadrant of right female breast: Secondary | ICD-10-CM | POA: Insufficient documentation

## 2019-06-22 MED ORDER — TAMOXIFEN CITRATE 10 MG PO TABS: 15.0000 mg | ORAL_TABLET | Freq: Two times a day (BID) | ORAL | 3 refills | Status: DC

## 2019-06-22 MED ORDER — VITAMIN D3 50 MCG (2000 UT) PO CAPS: 5000.0000 [IU] | ORAL_CAPSULE | Freq: Every day | ORAL | Status: DC

## 2019-06-22 NOTE — Assessment & Plan Note (Signed)
06/05/2018: Bilateral mastectomies: Right mastectomy: Invasive lobular carcinoma, multifocal, 1.2 cm is the largest tumor, grade 1, margins negative, 0/1 lymph node, ER 100%, PR 70%, HER-2 -1+, Ki-67 5% T1c N0 stage Ia Left mastectomy: Benign Oncotype DX: Score 11, ROR 3%  Current treatment: Tamoxifen started January 2020 Tamoxifen toxicities:  Breast cancer surveillance: 1.  Chest wall examination no palpable lumps or nodules. patient had previous bilateral mastectomy 2.  No role of imaging studies

## 2019-06-24 ENCOUNTER — Encounter: Payer: Medicare Other | Admitting: Internal Medicine

## 2019-06-29 ENCOUNTER — Telehealth: Payer: Self-pay

## 2019-06-29 NOTE — Telephone Encounter (Signed)
Yes that is fine. I will talk to her when I call her about her husband

## 2019-06-29 NOTE — Telephone Encounter (Signed)
The patient had an appointment 06/24/19 for medication management and to sign nonopioid contract.  She missed that appointment and is not available for an appointment in the near future.  She is asking if she can come by the clinic and sign the contract without having an appointment for a visit.  She states you are seeing her husband today and she will ask you about this at that time also.

## 2019-07-29 ENCOUNTER — Non-Acute Institutional Stay: Payer: Medicare PPO | Admitting: Internal Medicine

## 2019-07-29 ENCOUNTER — Other Ambulatory Visit: Payer: Self-pay

## 2019-07-29 ENCOUNTER — Encounter: Payer: Self-pay | Admitting: Internal Medicine

## 2019-07-29 VITALS — HR 88 | Temp 97.7°F

## 2019-07-29 DIAGNOSIS — F4321 Adjustment disorder with depressed mood: Secondary | ICD-10-CM

## 2019-07-29 DIAGNOSIS — F419 Anxiety disorder, unspecified: Secondary | ICD-10-CM | POA: Diagnosis not present

## 2019-08-02 NOTE — Progress Notes (Signed)
Location: [place] of Service:  Clinic (12)  Provider:   Code Status:  Goals of Care:  Advanced Directives 02/18/2019  Does Patient Have a Medical Advance Directive? Yes  Type of Advance Directive Living will;Healthcare Power of Attorney  Does patient want to make changes to medical advance directive? No - Patient declined  Copy of [place] in Chart? Yes - validated most recent copy scanned in chart (See row information)  Would patient like information on creating a medical advance directive? -  Pre-existing out of facility DNR order (yellow form or pink MOST form) -     Chief Complaint  Patient presents with  . Acute Visit    Non opiod contact    HPI: Patient is a 69 y.o. female seen today for an acute visit for Seen For Non Opiod Contract  No New Complains Having Anxiety as lost her husband and her father recently.  Also her son has been diagnosed with colon cancer Had presented contract before we continue her lorazepam  Past Medical History:  Diagnosis Date  . Anxiety   . Arthritis    "back" (06/05/2018)  . Breast cancer, right breast ([place]) 03/2018  . Chronic lower back pain    "if I don't do yoga" (06/05/2018)  . Depression   . Family history of breast cancer   . GERD (gastroesophageal reflux disease)   . 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 History of hiatal hernia   . Macular degeneration    Per [facility] New Patient Packet   . Nephritis    "as a child" (06/05/2018)  . Osteoporosis   . SCC (squamous cell carcinoma)    "under my nose" (06/05/2018)  . Strabismus    Per [place] new patient packet   . UTI (urinary tract infection) 8/15  . Wears glasses     Past Surgical History:  Procedure Laterality Date  . BREAST BIOPSY Right 04/2018  . CATARACT EXTRACTION W/ INTRAOCULAR LENS  IMPLANT, BILATERAL Bilateral   . CESAREAN SECTION  1977  . COLONOSCOPY    . CYST REMOVAL NECK Left 03/03/2014   Procedure: LEFT NECK CYST EXCISION;   Surgeon: Pedro Earls, MD;  Location: [place];  Service: General;  Laterality: Left;  . DILATION AND CURETTAGE OF UTERUS    . EYE SURGERY     Strabismus  . MASTECTOMY Left 06/05/2018  . MASTECTOMY COMPLETE / SIMPLE W/ SENTINEL NODE BIOPSY Right 06/05/2018   AND BLUE DYE INJECTION RIGHT BREAST  . MASTECTOMY W/ SENTINEL NODE BIOPSY Bilateral 06/05/2018   Procedure: BILATERAL MASTECTOMIES WITH RIGHT SENTINEL LYMPH NODE BIOPSY AND BLUE DYE INJECTION RIGHT BREAST;  Surgeon: Donnie Mesa, MD;  Location: [facility];  Service: General;  Laterality: Bilateral;  . SQUAMOUS CELL CARCINOMA EXCISION     "under my nose"  . STRABISMUS SURGERY Bilateral 05/02/2018   Procedure: BILATERAL REPAIR STRABISMUS;  Surgeon: Lamonte Sakai, MD;  Location: [place];  Service: Ophthalmology;  Laterality: Bilateral;  . TONSILLECTOMY AND ADENOIDECTOMY    . UPPER GI ENDOSCOPY    . WISDOM TOOTH EXTRACTION      Allergies  Allergen Reactions  . Bactrim [Sulfamethoxazole-Trimethoprim] Anaphylaxis, Hives and Rash    Outpatient Encounter Medications as of 07/29/2019  Medication Sig  . Brimonidine Tartrate (LUMIFY) 0.025 % SOLN Place 1 drop into both eyes daily.   Marland Kitchen CALCIUM-MAGNESIUM PO Take 1 tablet by mouth daily.   . Cholecalciferol (VITAMIN  D3) 50 MCG (2000 UT) capsule Take 2.5 capsules (5,000 Units total) by mouth daily.  Marland Kitchen desvenlafaxine (PRISTIQ) 50 MG 24 hr tablet Take 1 tablet (50 mg total) by mouth daily.  . Digestive Enzymes (DIGESTIVE ENZYME PO) Take 1 capsule by mouth 3 (three) times daily.   . Hypromellose (ARTIFICIAL TEARS OP) Place 1 drop into both eyes 2 (two) times daily.   Marland Kitchen Ketotifen Fumarate (ALAWAY OP) Apply to eye. As needed for allergies  . LINZESS 290 MCG CAPS capsule TAKE 1 CAPSULE EVERY OTHER DAY.  . LORazepam (ATIVAN) 0.5 MG tablet TAKE ONE TABLET AT BEDTIME.  . Magnesium Gluconate (MAG-G) 500 (27 Mg) MG TABS Take 500 mg by mouth daily.   . meloxicam (MOBIC)  15 MG tablet TAKE 1 TABLET ONCE DAILY.  . Multiple Vitamins-Minerals (PRESERVISION AREDS) CAPS Take 1 capsule by mouth 2 (two) times daily.   . Omega-3 Fatty Acids (OMEGA 3 PO) Take 1 capsule by mouth daily.   . SELENIUM PO Take 250 mg by mouth daily.  . sodium chloride (OCEAN) 0.65 % SOLN nasal spray Place 1 spray into both nostrils 2 (two) times daily as needed for congestion.  . tamoxifen (NOLVADEX) 10 MG tablet Take 1.5 tablets (15 mg total) by mouth 2 (two) times daily.   No facility-administered encounter medications on file as of 07/29/2019.    Review of Systems:  Review of Systems  Health Maintenance  Topic Date Due  . Hepatitis C Screening  [birth year]  . TETANUS/TDAP  09/13/1967  . COLONOSCOPY  09/12/1998  . PNA vac Low Risk Adult (1 of 2 - PCV13) 09/12/2013  . INFLUENZA VACCINE  Completed  . DEXA SCAN  Completed  . MAMMOGRAM  Discontinued    Physical Exam: Vitals:   07/29/19 1615  Pulse: 88  Temp: 97.7 F (36.5 C)  SpO2: 97%   There is no height or weight on file to calculate BMI. Physical Exam  Labs reviewed: Basic Metabolic Panel: Recent Labs    05/20/19 1623  NA 141  K 4.7  CL 106  CO2 28  GLUCOSE 81  BUN 17  CREATININE 0.99*  CALCIUM 9.8  TSH 4.43   Liver Function Tests: Recent Labs    05/20/19 1623  AST 19  ALT 18  BILITOT 0.5  PROT 7.0   No results for input(s): LIPASE, AMYLASE in the last 8760 hours. No results for input(s): AMMONIA in the last 8760 hours. CBC: Recent Labs    05/20/19 1623  WBC 5.1  NEUTROABS 2,382  HGB 13.4  HCT 40.2  MCV 90.1  PLT 232   Lipid Panel: Recent Labs    05/20/19 1623  CHOL 194  HDL 80  LDLCALC 93  TRIG 111  CHOLHDL 2.4   No results found for: HGBA1C  Procedures since last visit: No results found.  Assessment/Plan Non Opioid Contract and Renewal Patient was explained by my nurse about her contract for continued care And Renewal of Ativan    Labs/tests ordered:   Next appt:   09/21/2019

## 2019-08-13 ENCOUNTER — Other Ambulatory Visit: Payer: Self-pay | Admitting: Internal Medicine

## 2019-08-13 NOTE — Telephone Encounter (Signed)
RX last filled in Epic on 06/01/2019  Non-opioid treatment agreement on file from: 07/29/2019

## 2019-08-19 ENCOUNTER — Telehealth: Payer: Self-pay | Admitting: Internal Medicine

## 2019-08-19 NOTE — Telephone Encounter (Signed)
Patient requests to be called at ph# [phone] re: Patient states Dr. Cruzita Lederer told her to call in November 2020 re: getting a bone density test. Patient states she called in November but has not heard back from our office as of yet. Please call patient to advise.

## 2019-08-19 NOTE — Telephone Encounter (Signed)
Chart reviewed, and this was ordered 05/27/2019 and we called her and left a message. Patient never returned call.  Patient will need to contact [place] at [phone]

## 2019-08-20 NOTE — Telephone Encounter (Signed)
Called patient and left info via identified VM.

## 2019-09-04 ENCOUNTER — Other Ambulatory Visit: Payer: Self-pay | Admitting: Internal Medicine

## 2019-09-23 ENCOUNTER — Encounter: Payer: Self-pay | Admitting: Internal Medicine

## 2019-10-02 DIAGNOSIS — C50911 Malignant neoplasm of unspecified site of right female breast: Secondary | ICD-10-CM | POA: Diagnosis not present

## 2019-10-02 DIAGNOSIS — E559 Vitamin D deficiency, unspecified: Secondary | ICD-10-CM | POA: Diagnosis not present

## 2019-10-05 ENCOUNTER — Other Ambulatory Visit: Payer: Self-pay

## 2019-10-05 DIAGNOSIS — C50911 Malignant neoplasm of unspecified site of right female breast: Secondary | ICD-10-CM

## 2019-10-05 DIAGNOSIS — E559 Vitamin D deficiency, unspecified: Secondary | ICD-10-CM

## 2019-10-05 LAB — CBC WITH DIFFERENTIAL/PLATELET
Absolute Monocytes: 620 cells/uL (ref 200–950)
Basophils Absolute: 60 cells/uL (ref 0–200)
Basophils Relative: 1.2 %
Eosinophils Absolute: 190 cells/uL (ref 15–500)
Eosinophils Relative: 3.8 %
HCT: 38.9 % (ref 35.0–45.0)
Hemoglobin: 12.8 g/dL (ref 11.7–15.5)
Lymphs Abs: 1880 cells/uL (ref 850–3900)
MCH: 29.6 pg (ref 27.0–33.0)
MCHC: 32.9 g/dL (ref 32.0–36.0)
MCV: 89.8 fL (ref 80.0–100.0)
MPV: 11.4 fL (ref 7.5–12.5)
Monocytes Relative: 12.4 %
Neutro Abs: 2250 cells/uL (ref 1500–7800)
Neutrophils Relative %: 45 %
Platelets: 209 10*3/uL (ref 140–400)
RBC: 4.33 10*6/uL (ref 3.80–5.10)
RDW: 12.3 % (ref 11.0–15.0)
Total Lymphocyte: 37.6 %
WBC: 5 10*3/uL (ref 3.8–10.8)

## 2019-10-05 LAB — COMPLETE METABOLIC PANEL WITH GFR
AG Ratio: 1.6 (calc) (ref 1.0–2.5)
ALT: 11 U/L (ref 6–29)
AST: 15 U/L (ref 10–35)
Albumin: 3.8 g/dL (ref 3.6–5.1)
Alkaline phosphatase (APISO): 61 U/L (ref 37–153)
BUN/Creatinine Ratio: 18 (calc) (ref 6–22)
BUN: 18 mg/dL (ref 7–25)
CO2: 27 mmol/L (ref 20–32)
Calcium: 9.2 mg/dL (ref 8.6–10.4)
Chloride: 107 mmol/L (ref 98–110)
Creat: 0.99 mg/dL — ABNORMAL HIGH (ref 0.60–0.93)
GFR, Est African American: 66 mL/min/{1.73_m2} (ref >=60)
GFR, Est Non African American: 57 mL/min/{1.73_m2} — ABNORMAL LOW (ref >=60)
Globulin: 2.4 g/dL (calc) (ref 1.9–3.7)
Glucose, Bld: 84 mg/dL (ref 65–99)
Potassium: 4.3 mmol/L (ref 3.5–5.3)
Sodium: 141 mmol/L (ref 135–146)
Total Bilirubin: 0.5 mg/dL (ref 0.2–1.2)
Total Protein: 6.2 g/dL (ref 6.1–8.1)

## 2019-10-05 LAB — VITAMIN D 25 HYDROXY (VIT D DEFICIENCY, FRACTURES): Vit D, 25-Hydroxy: 43 ng/mL (ref 30–100)

## 2019-10-07 ENCOUNTER — Non-Acute Institutional Stay: Payer: Medicare PPO | Admitting: Internal Medicine

## 2019-10-07 ENCOUNTER — Other Ambulatory Visit: Payer: Self-pay

## 2019-10-07 ENCOUNTER — Encounter: Payer: Self-pay | Admitting: Internal Medicine

## 2019-10-07 VITALS — BP 144/86 | HR 93 | Temp 97.8°F | Ht 64.0 in | Wt 135.2 lb

## 2019-10-07 DIAGNOSIS — G8929 Other chronic pain: Secondary | ICD-10-CM

## 2019-10-07 DIAGNOSIS — M810 Age-related osteoporosis without current pathological fracture: Secondary | ICD-10-CM | POA: Diagnosis not present

## 2019-10-07 DIAGNOSIS — F331 Major depressive disorder, recurrent, moderate: Secondary | ICD-10-CM | POA: Diagnosis not present

## 2019-10-07 DIAGNOSIS — M545 Low back pain, unspecified: Secondary | ICD-10-CM

## 2019-10-07 DIAGNOSIS — F419 Anxiety disorder, unspecified: Secondary | ICD-10-CM | POA: Diagnosis not present

## 2019-10-07 DIAGNOSIS — C50911 Malignant neoplasm of unspecified site of right female breast: Secondary | ICD-10-CM | POA: Diagnosis not present

## 2019-10-07 DIAGNOSIS — E559 Vitamin D deficiency, unspecified: Secondary | ICD-10-CM

## 2019-10-08 MED ORDER — FLUTICASONE PROPIONATE 50 MCG/ACT NA SUSP: 2.0000 | Freq: Every day | NASAL | 6 refills | Status: DC

## 2019-10-08 NOTE — Progress Notes (Signed)
Location:  [place] of Service:  Clinic (12)  Provider:   Code Status:  Goals of Care:  Advanced Directives 02/18/2019  Does Patient Have a Medical Advance Directive? Yes  Type of Advance Directive Living will;Healthcare Power of Attorney  Does patient want to make changes to medical advance directive? No - Patient declined  Copy of Albert Le[place] in Chart? Yes - validated most recent copy scanned in chart (See row information)  Would patient like information on creating a medical advance directive? -  Pre-existing out of facility DNR order (yellow form or pink MOST form) -     Chief Complaint  Patient presents with  . Medical Management of Chronic Issues    4 month follow up.Patient would like a new prescription for flonase.   Marland Kitchen Health Maintenance    Colonoscopy scheduled for May.    HPI: Patient is a 69 y.o. female seen today for medical management of chronic diseases.    H/o Bilateral Mastectomyfor right breast invasive lobular carcinoma Follows with Dr.Gudena Taking Lower Dose of Tamoxifen due to side effects Mostly his weight gain and lower extremity edema  H/o Osteoporosis Last DEXA scan was from 11/18 She follows with Dr.Gherge Has refused Prolia Does have Follow up for DEXA  Chronic Constipation and Hemorrhoids Has Colonoscopy schedule with [facility] Low Back Pain with MRI in 2/19 showed Degenerative changes On Meloxicam Benign Cyst of Right Inner Ear Follows with Dr Melene Plan ENT with MRI every Year Depression with Anxiety On Pristiq Patient recently lost her husband .  Also had her son diagnosed with colon cancer who is now getting chemotherapy.  She also lost her father.  And is going through a lot of anxiety.  She said she is not sleeping well was shaking and looked very anxious today.  She takes Ativan as needed She is independent. Still drives and does not use any cane or walker.     Past Medical History:  Diagnosis  Date  . Anxiety   . Arthritis    "back" (06/05/2018)  . Breast cancer, right breast ([place]) 03/2018  . Chronic lower back pain    "if I don't do yoga" (06/05/2018)  . Depression   . Family history of breast cancer   . GERD (gastroesophageal reflux disease)   . 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 History of hiatal hernia   . Macular degeneration    Per [facility] New Patient Packet   . Nephritis    "as a child" (06/05/2018)  . Osteoporosis   . SCC (squamous cell carcinoma)    "under my nose" (06/05/2018)  . Strabismus    Per [place] new patient packet   . UTI (urinary tract infection) 8/15  . Wears glasses     Past Surgical History:  Procedure Laterality Date  . BREAST BIOPSY Right 04/2018  . CATARACT EXTRACTION W/ INTRAOCULAR LENS  IMPLANT, BILATERAL Bilateral   . CESAREAN SECTION  1977  . COLONOSCOPY    . CYST REMOVAL NECK Left 03/03/2014   Procedure: LEFT NECK CYST EXCISION;  Surgeon: Pedro Earls, MD;  Location: [place];  Service: General;  Laterality: Left;  . DILATION AND CURETTAGE OF UTERUS    . EYE SURGERY     Strabismus  . MASTECTOMY Left 06/05/2018  . MASTECTOMY COMPLETE / SIMPLE W/ SENTINEL NODE BIOPSY Right 06/05/2018   AND BLUE DYE INJECTION RIGHT BREAST  . MASTECTOMY W/ SENTINEL NODE  BIOPSY Bilateral 06/05/2018   Procedure: BILATERAL MASTECTOMIES WITH RIGHT SENTINEL LYMPH NODE BIOPSY AND BLUE DYE INJECTION RIGHT BREAST;  Surgeon: Donnie Mesa, MD;  Location: [place];  Service: General;  Laterality: Bilateral;  . SQUAMOUS CELL CARCINOMA EXCISION     "under my nose"  . STRABISMUS SURGERY Bilateral 05/02/2018   Procedure: BILATERAL REPAIR STRABISMUS;  Surgeon: Lamonte Sakai, MD;  Location: [place];  Service: Ophthalmology;  Laterality: Bilateral;  . TONSILLECTOMY AND ADENOIDECTOMY    . UPPER GI ENDOSCOPY    . WISDOM TOOTH EXTRACTION      Allergies  Allergen Reactions  . Bactrim [Sulfamethoxazole-Trimethoprim] Anaphylaxis, Hives and  Rash    Outpatient Encounter Medications as of 10/07/2019  Medication Sig  . Brimonidine Tartrate (LUMIFY) 0.025 % SOLN Place 1 drop into both eyes daily.   Marland Kitchen CALCIUM-MAGNESIUM PO Take 1 tablet by mouth daily.   . Cholecalciferol (VITAMIN D3) 50 MCG (2000 UT) capsule Take 2.5 capsules (5,000 Units total) by mouth daily. (Patient taking differently: Take 2,000 Units by mouth daily. )  . desvenlafaxine (PRISTIQ) 50 MG 24 hr tablet Take 1 tablet (50 mg total) by mouth daily.  . Digestive Enzymes (DIGESTIVE ENZYME PO) Take 1 capsule by mouth 3 (three) times daily.   . Hypromellose (ARTIFICIAL TEARS OP) Place 1 drop into both eyes 2 (two) times daily.   Marland Kitchen Ketotifen Fumarate (ALAWAY OP) Apply to eye. As needed for allergies  . LORazepam (ATIVAN) 0.5 MG tablet TAKE ONE TABLET AT BEDTIME. (Patient taking differently: Can Take Extra0.25 mg Tab if Needed in the day time)  . Magnesium Gluconate (MAG-G) 500 (27 Mg) MG TABS Take 500 mg by mouth daily.   . melatonin 3 MG TABS tablet Take 3 mg by mouth at bedtime as needed.  . meloxicam (MOBIC) 15 MG tablet TAKE 1 TABLET ONCE DAILY.  . Multiple Vitamins-Minerals (PRESERVISION AREDS) CAPS Take 1 capsule by mouth 2 (two) times daily.   . Omega-3 Fatty Acids (OMEGA 3 PO) Take 1 capsule by mouth daily.   . psyllium (METAMUCIL) 58.6 % packet Take 1 packet by mouth daily.  . sodium chloride (OCEAN) 0.65 % SOLN nasal spray Place 1 spray into both nostrils 2 (two) times daily as needed for congestion.  . tamoxifen (NOLVADEX) 10 MG tablet Take 1.5 tablets (15 mg total) by mouth 2 (two) times daily. (Patient taking differently: Take by mouth 2 (two) times daily. 15 mg  in am and 10 mg in pm)  . fluticasone (FLONASE) 50 MCG/ACT nasal spray Place 2 sprays into both nostrils daily.  . [DISCONTINUED] LINZESS 290 MCG CAPS capsule TAKE 1 CAPSULE EVERY OTHER DAY. (Patient not taking: Reported on 10/07/2019)  . [DISCONTINUED] SELENIUM PO Take 250 mg by mouth daily.   No  facility-administered encounter medications on file as of 10/07/2019.    Review of Systems:  Review of Systems Review of Systems  Constitutional: Negative for activity change, appetite change, chills, diaphoresis, fatigue and fever.  HENT: Negative for mouth sores, postnasal drip, rhinorrhea, sinus pain and sore throat.   Respiratory: Negative for apnea, cough, chest tightness, shortness of breath and wheezing.   Cardiovascular: Negative for chest pain,Occassional Palpitations when very Anxious  Gastrointestinal: Negative for abdominal distention, abdominal pain, constipation, diarrhea, nausea and vomiting.  Genitourinary: Negative for dysuria and frequency.  Musculoskeletal: Negative for arthralgias, joint swelling and myalgias.  Skin: Negative for rash.  Neurological: Negative for dizziness, syncope, weakness, light-headedness and numbness.  Psychiatric/Behavioral:For Anxiety and Insomnia  Health Maintenance  Topic Date Due  . Hepatitis C Screening  Never done  . TETANUS/TDAP  Never done  . COLONOSCOPY  Never done  . PNA vac Low Risk Adult (1 of 2 - PCV13) Never done  . INFLUENZA VACCINE  Completed  . DEXA SCAN  Completed  . MAMMOGRAM  Discontinued    Physical Exam: Vitals:   10/07/19 1614  BP: (!) 144/86  Pulse: 93  Temp: 97.8 F (36.6 C)  SpO2: 95%  Weight: 135 lb 3.2 oz (61.3 kg)  Height: 5\' 4"  (1.626 m)   Body mass index is 23.21 kg/m. Physical Exam  Constitutional: Oriented to person, place, and time. Well-developed and well-nourished.  HENT:  Head: Normocephalic.  Mouth/Throat: Oropharynx is clear and moist.  Eyes: Pupils are equal, round, and reactive to light.  Neck: Neck supple.  Cardiovascular: Normal rate and normal heart sounds.  No murmur heard. Pulmonary/Chest: Effort normal and breath sounds normal. No respiratory distress. No wheezes. She has no rales.  Abdominal: Soft. Bowel sounds are normal. No distension. There is no tenderness. There is no  rebound.  Musculoskeletal: Mild Edema Lymphadenopathy: none Neurological: Alert and oriented to person, place, and time.  Skin: Skin is warm and dry.  Psychiatric: Normal mood and affect. Behavior is normal. Thought content normal.    Labs reviewed: Basic Metabolic Panel: Recent Labs    05/20/19 1623 10/02/19 0831  NA 141 141  K 4.7 4.3  CL 106 107  CO2 28 27  GLUCOSE 81 84  BUN 17 18  CREATININE 0.99* 0.99*  CALCIUM 9.8 9.2  TSH 4.43  --    Liver Function Tests: Recent Labs    05/20/19 1623 10/02/19 0831  AST 19 15  ALT 18 11  BILITOT 0.5 0.5  PROT 7.0 6.2   No results for input(s): LIPASE, AMYLASE in the last 8760 hours. No results for input(s): AMMONIA in the last 8760 hours. CBC: Recent Labs    05/20/19 1623 10/02/19 0831  WBC 5.1 5.0  NEUTROABS 2,382 2,250  HGB 13.4 12.8  HCT 40.2 38.9  MCV 90.1 89.8  PLT 232 209   Lipid Panel: Recent Labs    05/20/19 1623  CHOL 194  HDL 80  LDLCALC 93  TRIG 111  CHOLHDL 2.4   No results found for: HGBA1C  Procedures since last visit: No results found.  Assessment/Plan  Invasive lobular carcinoma of breast, stage 1, right ([place])- Plan:  On Tamoxifen Osteoporosis- Plan:  Will follow with her Rheumatology for DEXA  Recurrent major depressive disorder ([place]) - Plan: Continue on Pristiq and Ativan as needed Discussed with her that she can take extra Ativan if having hard time with anxiety  Chronic bilateral low back pain without sciatica - Plan: Patient is going to reduce her dose of meloxicam to 7.5 mg Eventually plan is for her to taper herself off  Ear cysts -Plan:  Follow-up with MRI of her head due in01/2021  Constipation, unspecified constipation type- Plan:  Follow-up due with [facility] colonoscopy She stopped her Linzess  Stress Urinary Incontinence Doing Kegel exercise Vit D Def Levels are now good I have told her to reduce to vitamin D2 2000 units Allergic rhinitis Use  Flonase as needed Insomnia Melatonin as needed    Labs/tests ordered:  * No order type specified * Next appt:  01/28/2020

## 2019-10-09 ENCOUNTER — Other Ambulatory Visit: Payer: Self-pay | Admitting: *Deleted

## 2019-10-09 ENCOUNTER — Other Ambulatory Visit: Payer: Self-pay | Admitting: Internal Medicine

## 2019-10-09 NOTE — Telephone Encounter (Signed)
Patient pharmacy sent medication refill. Please Advise.

## 2019-10-09 NOTE — Telephone Encounter (Signed)
Pended Rx and sent to Dr. Gupta for approval.  

## 2019-10-09 NOTE — Telephone Encounter (Signed)
Yes

## 2019-10-09 NOTE — Telephone Encounter (Signed)
Received fax from [facility] wanting to clarify the directions of Lorazepam. They want to know if the Directions should be:  Take one tablet by mouth at bedtime and Lisa Yoder take and extra 1/2 tablet if needed.   Please Advise.

## 2019-10-09 NOTE — Addendum Note (Signed)
Addended by: Rafael Bihari A on: 10/09/2019 04:02 PM   Modules accepted: Orders

## 2019-10-12 MED ORDER — LORAZEPAM 0.5 MG PO TABS: ORAL_TABLET | ORAL | 0 refills | Status: DC

## 2019-10-12 NOTE — Telephone Encounter (Signed)
Rx forwarded to Dr. Lyndel Safe for approval.

## 2019-10-12 NOTE — Addendum Note (Signed)
Addended by: Rafael Bihari A on: 10/12/2019 08:11 AM   Modules accepted: Orders

## 2019-10-30 IMAGING — MG MM CLIP PLACEMENT
2 series · 2 of 2 positions shown · non-contrast
Comparison: 04/26/2018 and earlier

CLINICAL DATA: Status post MR guided core biopsy of masses in the
UPPER-OUTER QUADRANT of the RIGHT breast.

EXAM:
DIAGNOSTIC RIGHT MAMMOGRAM POST MRI BIOPSY

[R CC]
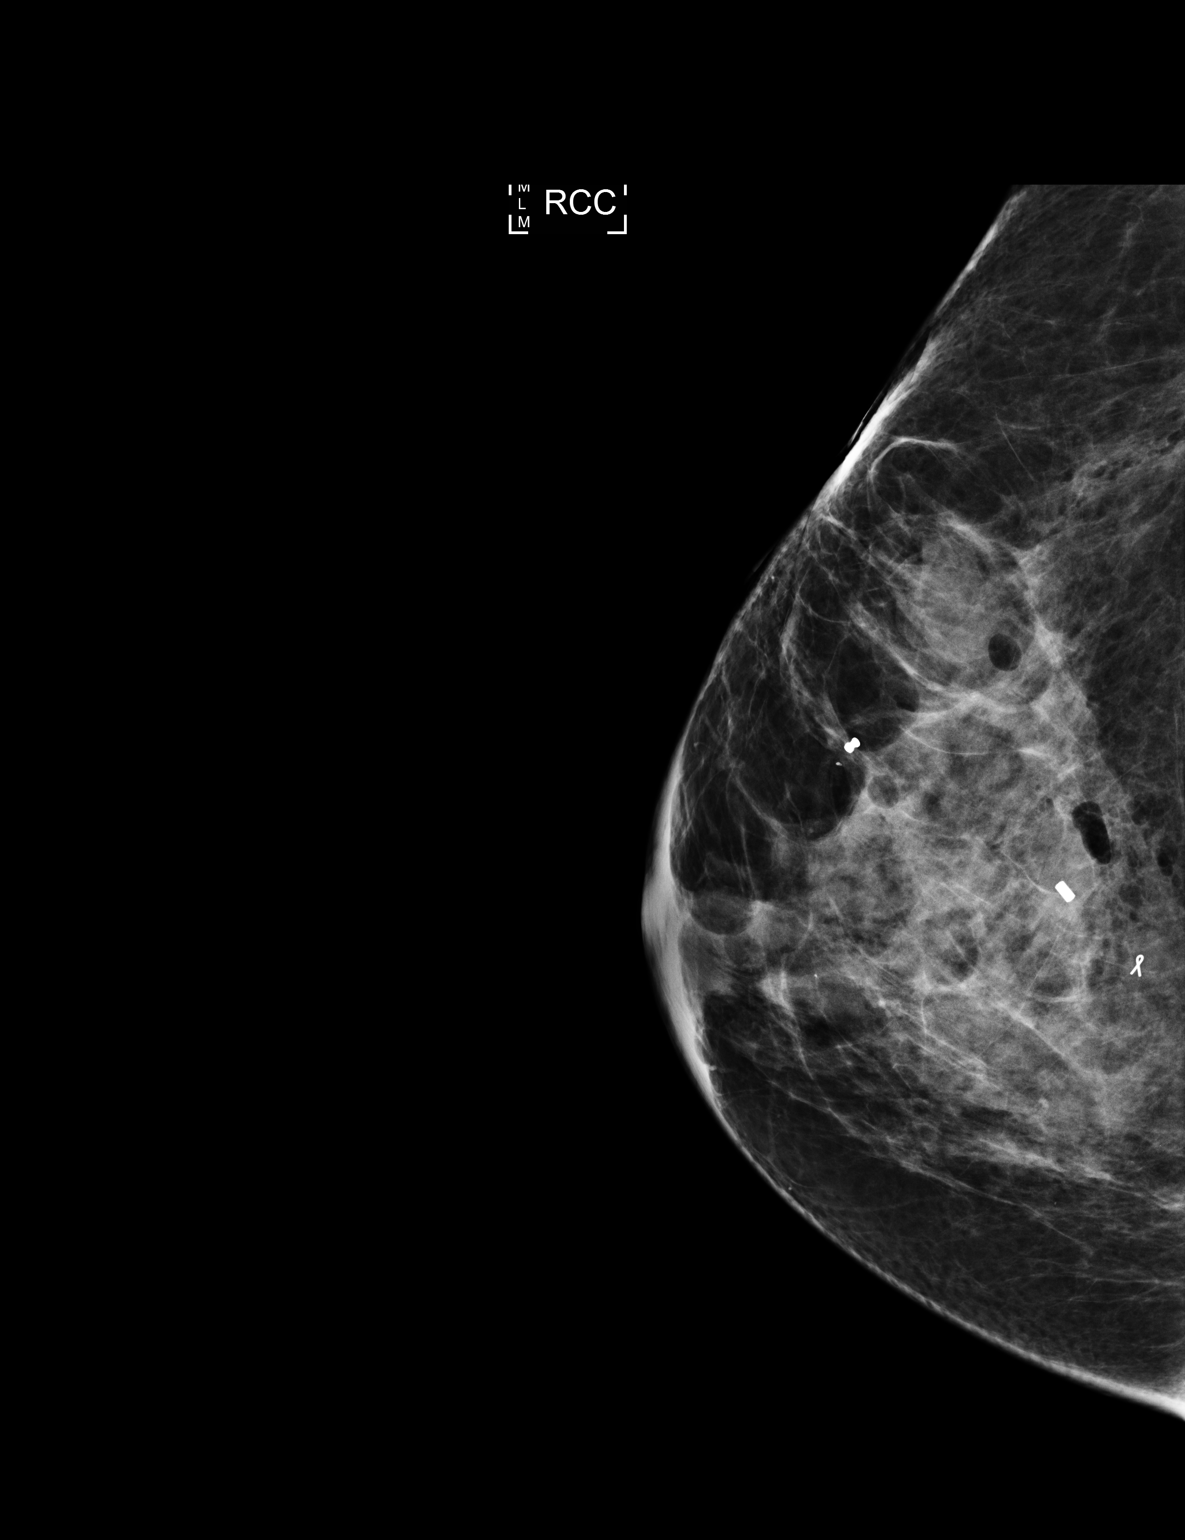

[R ML]
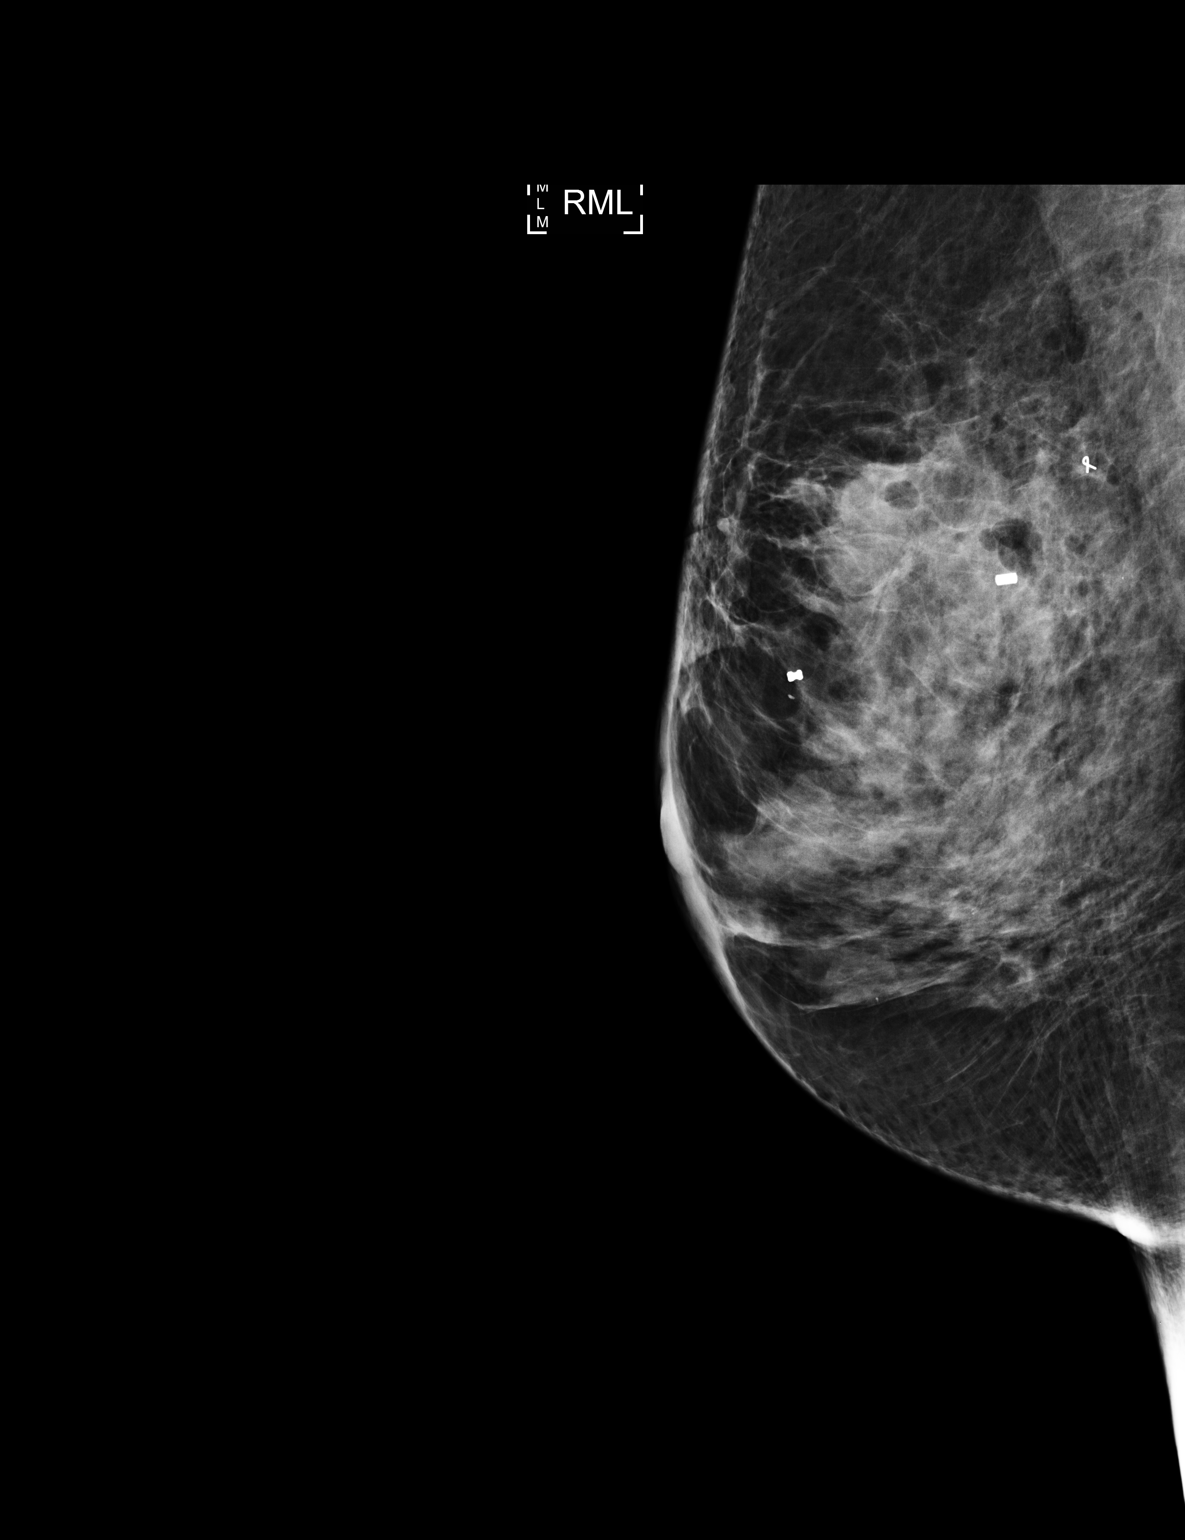

[2 of 2 positions shown; findings below may reference images not displayed]

FINDINGS: Mammographic images were obtained following MRI guided biopsy of
mass in the anterior UPPER OUTER QUADRANT of the RIGHT breast and
placement of a bow tie shaped clip. Clip is in expected location,
5.3 centimeters from the known malignancy marked with a ribbon
shaped clip in the posterior UPPER portion of the RIGHT breast.

Following biopsy of a mass in the UPPER-OUTER QUADRANT posterior
portion of the RIGHT breast, a cylinder-shaped clip was placed. Clip
is in the expected location, 2.1 centimeters anterior to the known
malignancy in the posterior superior portion of the RIGHT breast.
IMPRESSION: Tissue marker clips are in the expected locations after biopsy.

Final Assessment: Post Procedure Mammograms for Marker Placement

## 2019-10-30 IMAGING — MR MR BREAST BX W LOC DEV 1ST LESION IMAGE BX SPEC MR GUIDE*R*
6 of 8 series · 33 of 48 positions shown · IV contrast (10 ml Multihance)
Comparison: Previous exams.

ADDENDUM:
Pathology revealed ATYPICAL LOBULAR HYPERPLASIA of the Right breast,
anterior, upper outer quadrant, (dumbbell clip). This was found to
be concordant by Dr. Fay Slay, with excision recommended.

Pathology revealed GRADE I INVASIVE MAMMARY CARCINOMA, MAMMARY
CARCINOMA IN-SITU of the Right breast, posterior upper outer
quadrant, (cylinder clip). This was found to be concordant by Dr.
Fay Slay.
Pathology results were discussed with the patient by telephone. The
patient reported doing well after the biopsies with tenderness at
the sites. Post biopsy instructions and care were reviewed and
questions were answered. The patient was encouraged to call The
The patient has a recent diagnosis of right breast cancer and should
follow her outlined treatment plan. Dr. Moiz Atkin was notified
of biopsy results via [REDACTED] message on May 06, 2018.
Pathology results reported by Keo Cato, RN on 05/06/2018.
CLINICAL DATA: Patient has been recently diagnosed with grade 1
invasive lobular carcinoma and lobular carcinoma in situ in the
RIGHT breast 1 o'clock location. MRI demonstrates 2 additional
enhancing masses in the RIGHT breast and patient presents for biopsy
of these masses today.
EXAM:
MRI GUIDED CORE NEEDLE BIOPSY OF THE RIGHT BREAST x2
TECHNIQUE: Multiplanar, multisequence MR imaging of the RIGHT breast was
performed both before and after administration of intravenous
contrast.
CONTRAST:  10mL MULTIHANCE GADOBENATE DIMEGLUMINE 529 MG/ML IV SOLN

[Series 2: fiducial unilateral · sagittal · 2.0mm · 1.33mm/px · 1 of 52 slices shown]
[im 1/52]
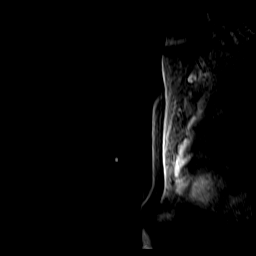

[Series 3: dynamic pre · axial · non-contrast · 1.3mm · 0.73mm/px · z∈[-93,+92]mm · 6 of 144 slices shown]
[im 1/144]
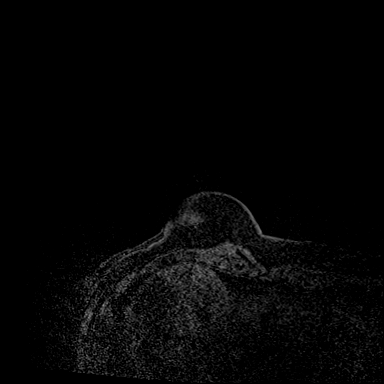
[im 29/144]
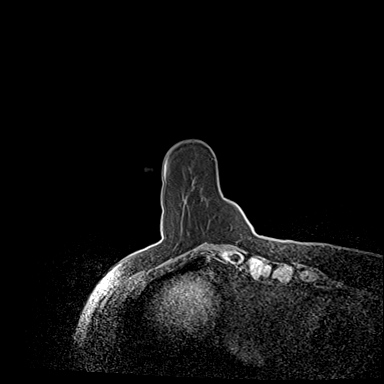
[im 58/144]
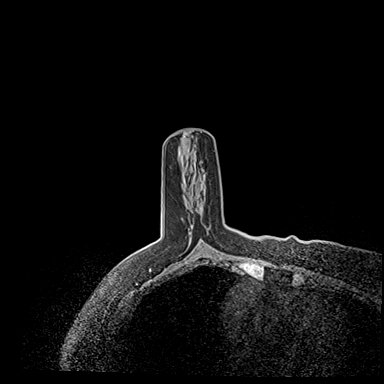
[im 86/144]
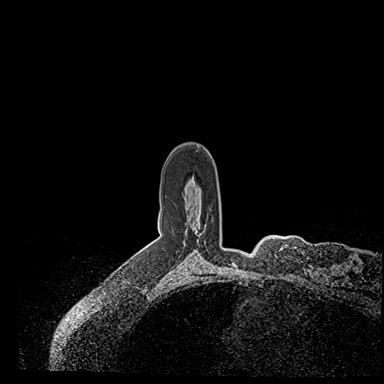
[im 115/144]
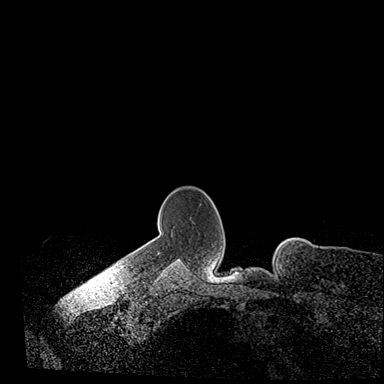
[im 144/144]
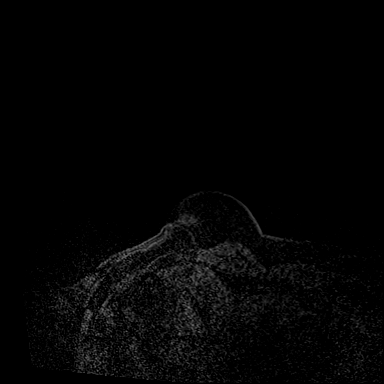

[Series 4: dynamic post 20 · axial · 1.3mm · 0.73mm/px · z∈[-93,+92]mm · 6 of 144 slices shown (1 of 2)]
[im 1/144]
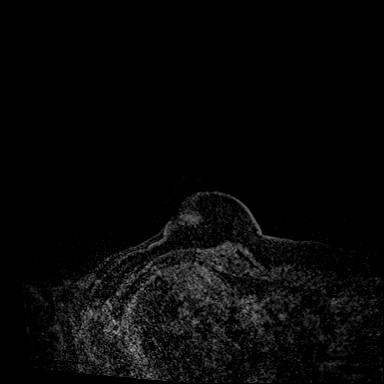
[im 29/144]
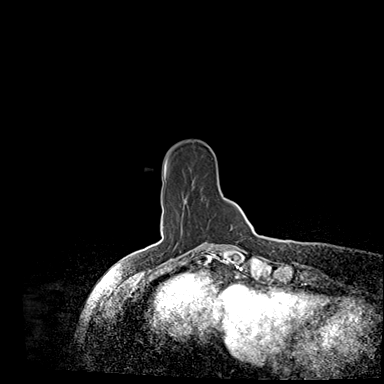
[im 58/144]
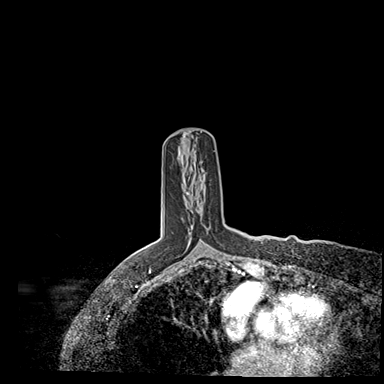
[im 86/144]
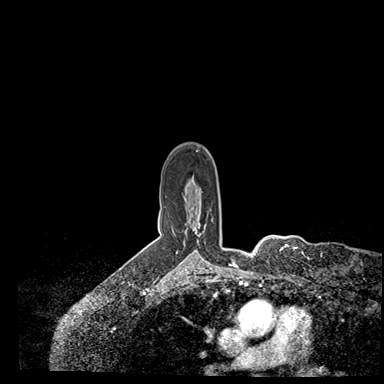
[im 115/144]
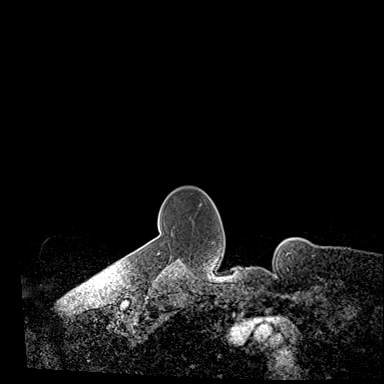
[im 144/144]
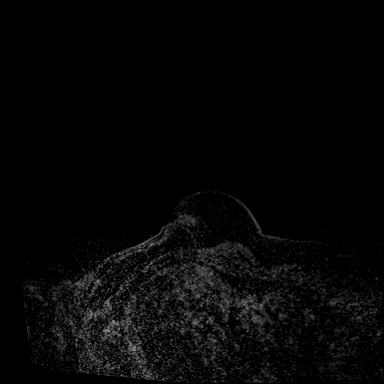

[Series 5: dynamic post 20 · axial · 1.3mm · 0.73mm/px · z∈[-93,+92]mm · 7 of 144 slices shown (2 of 2)]
[im 1/144]
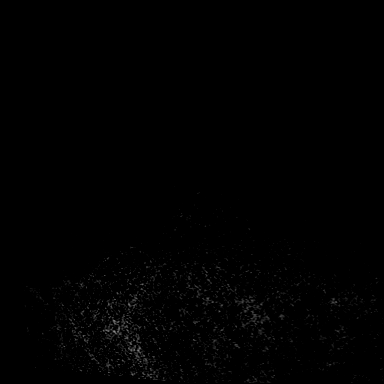
[im 24/144]
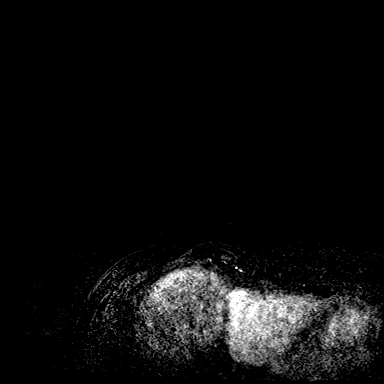
[im 48/144]
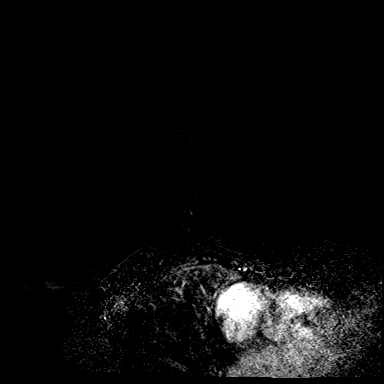
[im 72/144]
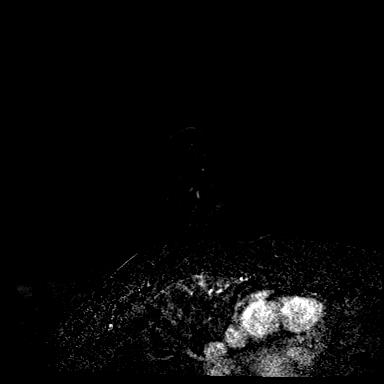
[im 96/144]
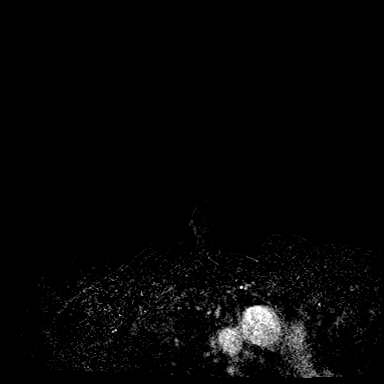
[im 120/144]
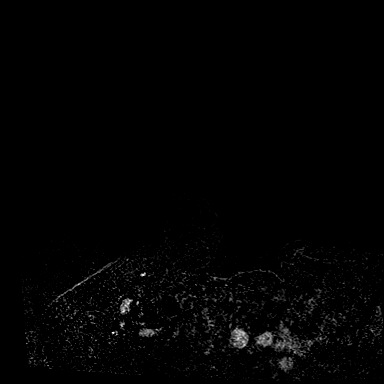
[im 144/144]
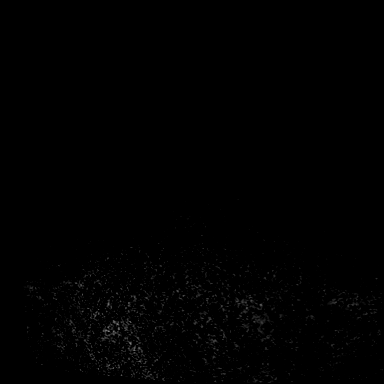

[Series 6: needle confirmation · axial · 1.3mm · 0.73mm/px · z∈[-93,+92]mm · 7 of 144 slices shown]
[im 1/144]
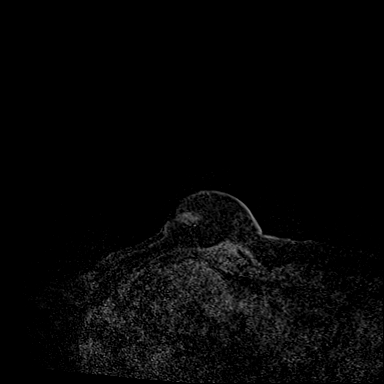
[im 24/144]
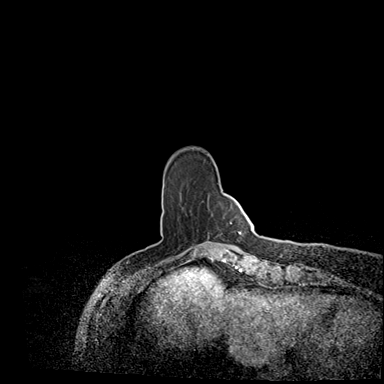
[im 48/144]
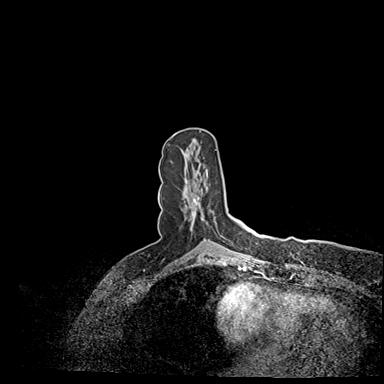
[im 72/144]
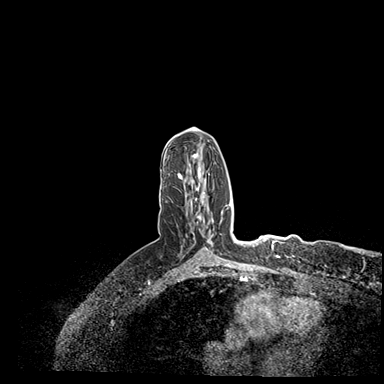
[im 96/144]
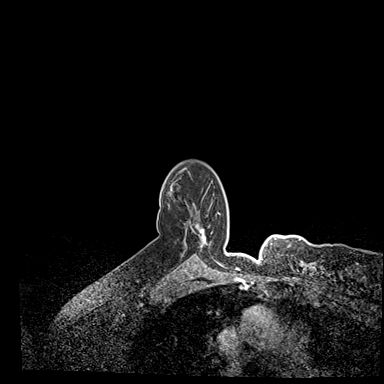
[im 120/144]
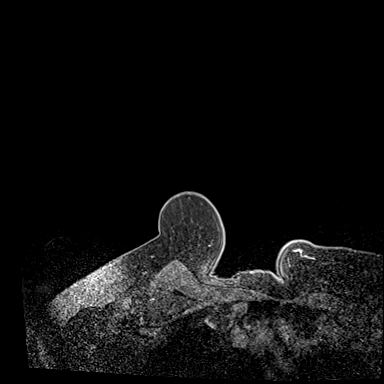
[im 144/144]
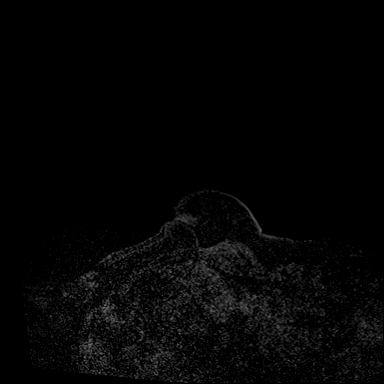

[Series 7: needle confirmation_sub · axial · 1.3mm · 0.73mm/px · z∈[-93,+61]mm · 6 of 144 slices shown]
[im 1/144]
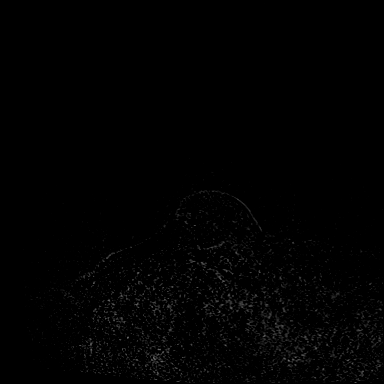
[im 24/144]
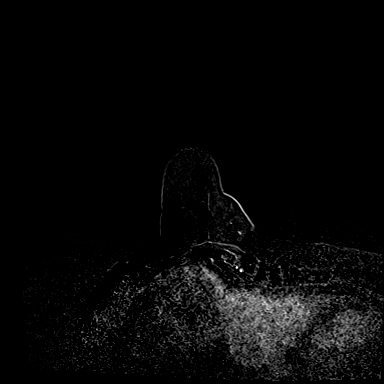
[im 48/144]
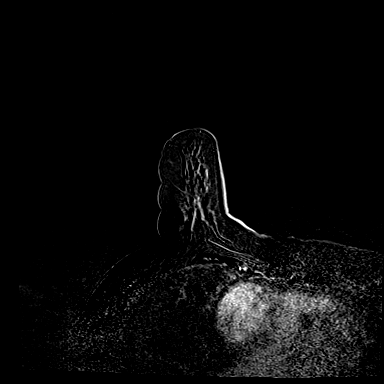
[im 72/144]
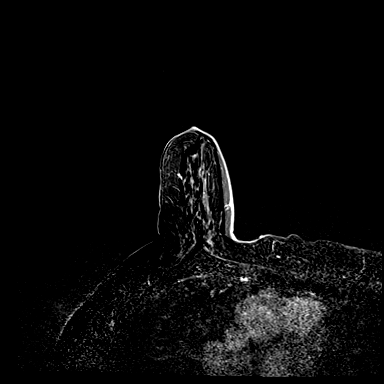
[im 96/144]
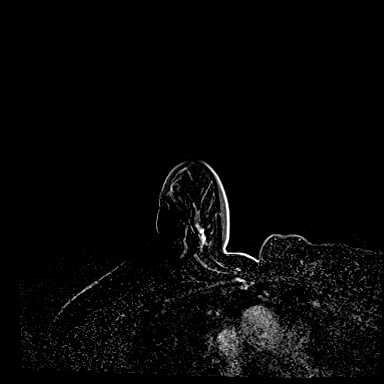
[im 120/144]
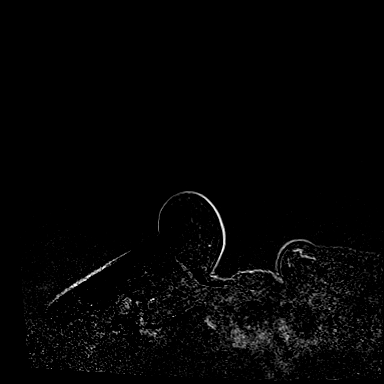

[33 of 48 positions shown; findings below may reference images not displayed]

FINDINGS: I met with the patient, and we discussed the procedure of MRI guided
biopsy, including risks, benefits, and alternatives. Specifically,
we discussed the risks of infection, bleeding, tissue injury, clip
migration, and inadequate sampling. Informed, written consent was
given. The usual time out protocol was performed immediately prior
to the procedure.

Using sterile technique, 1% Lidocaine, MRI guidance, and a 9 gauge
vacuum assisted device, biopsy was performed of anterior mass in the
UPPER-OUTER QUADRANT of the RIGHT breast using a LATERAL approach.
At the conclusion of the procedure, a bow tie shaped tissue marker
clip was deployed into the biopsy cavity.

Using sterile technique, 1% Lidocaine, MRI guidance, and a 9 gauge
vacuum assisted device, biopsy was performed of posterior mass in
the UPPER-OUTER QUADRANT of the RIGHT breast using a LATERAL
approach. At the conclusion of the procedure, a cylinder shaped
tissue marker clip was deployed into the biopsy cavity.

Follow-up 2-view mammogram was performed and dictated separately.
IMPRESSION: MRI guided biopsy of 2 masses in the UPPER-OUTER QUADRANT of the
RIGHT breast. No apparent complications.

## 2019-11-23 ENCOUNTER — Other Ambulatory Visit: Payer: Self-pay | Admitting: Internal Medicine

## 2019-11-23 NOTE — Telephone Encounter (Signed)
Received refill Request through [facility] and sent to Dr. Lyndel Safe for approval.

## 2019-12-16 ENCOUNTER — Other Ambulatory Visit: Payer: Self-pay | Admitting: Internal Medicine

## 2019-12-16 DIAGNOSIS — Z9012 Acquired absence of left breast and nipple: Secondary | ICD-10-CM | POA: Diagnosis not present

## 2019-12-16 DIAGNOSIS — C50911 Malignant neoplasm of unspecified site of right female breast: Secondary | ICD-10-CM | POA: Diagnosis not present

## 2019-12-17 DIAGNOSIS — Z9012 Acquired absence of left breast and nipple: Secondary | ICD-10-CM | POA: Diagnosis not present

## 2019-12-17 DIAGNOSIS — C50911 Malignant neoplasm of unspecified site of right female breast: Secondary | ICD-10-CM | POA: Diagnosis not present

## 2019-12-18 DIAGNOSIS — Z03818 Encounter for observation for suspected exposure to other biological agents ruled out: Secondary | ICD-10-CM | POA: Diagnosis not present

## 2019-12-23 DIAGNOSIS — D123 Benign neoplasm of transverse colon: Secondary | ICD-10-CM | POA: Diagnosis not present

## 2019-12-23 DIAGNOSIS — K2930 Chronic superficial gastritis without bleeding: Secondary | ICD-10-CM | POA: Diagnosis not present

## 2019-12-23 DIAGNOSIS — K648 Other hemorrhoids: Secondary | ICD-10-CM | POA: Diagnosis not present

## 2019-12-23 DIAGNOSIS — D120 Benign neoplasm of cecum: Secondary | ICD-10-CM | POA: Diagnosis not present

## 2019-12-23 DIAGNOSIS — Z86010 Personal history of colonic polyps: Secondary | ICD-10-CM | POA: Diagnosis not present

## 2019-12-23 DIAGNOSIS — K3189 Other diseases of stomach and duodenum: Secondary | ICD-10-CM | POA: Diagnosis not present

## 2019-12-23 DIAGNOSIS — K228 Other specified diseases of esophagus: Secondary | ICD-10-CM | POA: Diagnosis not present

## 2019-12-23 DIAGNOSIS — R1310 Dysphagia, unspecified: Secondary | ICD-10-CM | POA: Diagnosis not present

## 2019-12-28 DIAGNOSIS — K228 Other specified diseases of esophagus: Secondary | ICD-10-CM | POA: Diagnosis not present

## 2019-12-28 DIAGNOSIS — D120 Benign neoplasm of cecum: Secondary | ICD-10-CM | POA: Diagnosis not present

## 2019-12-28 DIAGNOSIS — D123 Benign neoplasm of transverse colon: Secondary | ICD-10-CM | POA: Diagnosis not present

## 2019-12-28 DIAGNOSIS — K2930 Chronic superficial gastritis without bleeding: Secondary | ICD-10-CM | POA: Diagnosis not present

## 2020-01-04 ENCOUNTER — Other Ambulatory Visit: Payer: Self-pay | Admitting: Internal Medicine

## 2020-01-04 NOTE — Telephone Encounter (Signed)
Pharmacy requested refill Pended Rx and sent to Dr. Gupta for approval.  

## 2020-01-13 ENCOUNTER — Other Ambulatory Visit: Payer: Self-pay | Admitting: Gastroenterology

## 2020-01-20 DIAGNOSIS — S60459A Superficial foreign body of unspecified finger, initial encounter: Secondary | ICD-10-CM | POA: Diagnosis not present

## 2020-01-28 ENCOUNTER — Other Ambulatory Visit: Payer: Self-pay

## 2020-01-28 DIAGNOSIS — M810 Age-related osteoporosis without current pathological fracture: Secondary | ICD-10-CM | POA: Diagnosis not present

## 2020-01-28 LAB — CBC WITH DIFFERENTIAL/PLATELET
Absolute Monocytes: 583 cells/uL (ref 200–950)
Basophils Absolute: 81 cells/uL (ref 0–200)
Basophils Relative: 1.3 %
Eosinophils Absolute: 198 cells/uL (ref 15–500)
Eosinophils Relative: 3.2 %
HCT: 43.1 % (ref 35.0–45.0)
Hemoglobin: 14.3 g/dL (ref 11.7–15.5)
Lymphs Abs: 2759 cells/uL (ref 850–3900)
MCH: 29.9 pg (ref 27.0–33.0)
MCHC: 33.2 g/dL (ref 32.0–36.0)
MCV: 90 fL (ref 80.0–100.0)
MPV: 11.6 fL (ref 7.5–12.5)
Monocytes Relative: 9.4 %
Neutro Abs: 2579 cells/uL (ref 1500–7800)
Neutrophils Relative %: 41.6 %
Platelets: 229 10*3/uL (ref 140–400)
RBC: 4.79 10*6/uL (ref 3.80–5.10)
RDW: 12.1 % (ref 11.0–15.0)
Total Lymphocyte: 44.5 %
WBC: 6.2 10*3/uL (ref 3.8–10.8)

## 2020-01-28 LAB — COMPLETE METABOLIC PANEL WITH GFR
AG Ratio: 1.8 (calc) (ref 1.0–2.5)
ALT: 15 U/L (ref 6–29)
AST: 17 U/L (ref 10–35)
Albumin: 4.2 g/dL (ref 3.6–5.1)
Alkaline phosphatase (APISO): 72 U/L (ref 37–153)
BUN/Creatinine Ratio: 17 (calc) (ref 6–22)
BUN: 19 mg/dL (ref 7–25)
CO2: 28 mmol/L (ref 20–32)
Calcium: 10 mg/dL (ref 8.6–10.4)
Chloride: 103 mmol/L (ref 98–110)
Creat: 1.09 mg/dL — ABNORMAL HIGH (ref 0.60–0.93)
GFR, Est African American: 59 mL/min/{1.73_m2} — ABNORMAL LOW (ref >=60)
GFR, Est Non African American: 51 mL/min/{1.73_m2} — ABNORMAL LOW (ref >=60)
Globulin: 2.4 g/dL (calc) (ref 1.9–3.7)
Glucose, Bld: 101 mg/dL — ABNORMAL HIGH (ref 65–99)
Potassium: 4.6 mmol/L (ref 3.5–5.3)
Sodium: 140 mmol/L (ref 135–146)
Total Bilirubin: 0.6 mg/dL (ref 0.2–1.2)
Total Protein: 6.6 g/dL (ref 6.1–8.1)

## 2020-01-28 LAB — LIPID PANEL
Cholesterol: 195 mg/dL (ref <200)
HDL: 74 mg/dL (ref >=50)
LDL Cholesterol (Calc): 98 mg/dL (calc)
Non-HDL Cholesterol (Calc): 121 mg/dL (calc) (ref <130)
Total CHOL/HDL Ratio: 2.6 (calc) (ref <5.0)
Triglycerides: 135 mg/dL (ref <150)

## 2020-02-03 ENCOUNTER — Encounter: Payer: Self-pay | Admitting: Internal Medicine

## 2020-02-11 DIAGNOSIS — H16301 Unspecified interstitial keratitis, right eye: Secondary | ICD-10-CM | POA: Diagnosis not present

## 2020-02-15 ENCOUNTER — Other Ambulatory Visit: Payer: Self-pay | Admitting: Internal Medicine

## 2020-02-15 NOTE — Telephone Encounter (Signed)
Received refill from pharmacy Pended Rx and sent to Dr. Lyndel Safe for approval.

## 2020-02-17 ENCOUNTER — Non-Acute Institutional Stay: Payer: Medicare PPO | Admitting: Internal Medicine

## 2020-02-17 ENCOUNTER — Other Ambulatory Visit: Payer: Self-pay

## 2020-02-17 ENCOUNTER — Encounter: Payer: Self-pay | Admitting: Internal Medicine

## 2020-02-17 VITALS — BP 148/100 | HR 86 | Temp 98.2°F | Ht 64.0 in | Wt 133.8 lb

## 2020-02-17 DIAGNOSIS — B3731 Acute candidiasis of vulva and vagina: Secondary | ICD-10-CM

## 2020-02-17 DIAGNOSIS — I10 Essential (primary) hypertension: Secondary | ICD-10-CM | POA: Diagnosis not present

## 2020-02-17 DIAGNOSIS — G8929 Other chronic pain: Secondary | ICD-10-CM

## 2020-02-17 DIAGNOSIS — B373 Candidiasis of vulva and vagina: Secondary | ICD-10-CM | POA: Diagnosis not present

## 2020-02-17 DIAGNOSIS — C50911 Malignant neoplasm of unspecified site of right female breast: Secondary | ICD-10-CM | POA: Diagnosis not present

## 2020-02-17 DIAGNOSIS — F331 Major depressive disorder, recurrent, moderate: Secondary | ICD-10-CM | POA: Diagnosis not present

## 2020-02-17 DIAGNOSIS — M545 Low back pain: Secondary | ICD-10-CM | POA: Diagnosis not present

## 2020-02-17 DIAGNOSIS — M810 Age-related osteoporosis without current pathological fracture: Secondary | ICD-10-CM | POA: Diagnosis not present

## 2020-02-17 DIAGNOSIS — E559 Vitamin D deficiency, unspecified: Secondary | ICD-10-CM

## 2020-02-17 DIAGNOSIS — F419 Anxiety disorder, unspecified: Secondary | ICD-10-CM | POA: Diagnosis not present

## 2020-02-17 NOTE — Patient Instructions (Signed)
Monostat Suppository Every day for 7 Days

## 2020-02-17 NOTE — Progress Notes (Signed)
Location:  [place] of Service:  Clinic (12)  Provider:   Code Status:  Goals of Care:  Advanced Directives 02/18/2019  Does Patient Have a Medical Advance Directive? Yes  Type of Advance Directive Living will;Healthcare Power of Attorney  Does patient want to make changes to medical advance directive? No - Patient declined  Copy of [place] in Chart? Yes - validated most recent copy scanned in chart (See row information)  Would patient like information on creating a medical advance directive? -  Pre-existing out of facility DNR order (yellow form or pink MOST form) -     Chief Complaint  Patient presents with  . Medical Management of Chronic Issues    Patient returns to clinic for her 4 month follow up.   Marland Kitchen Health Maintenance    Hep C screen, TDAP, PCV13    HPI: Patient is a 69 y.o. female seen today for medical management of chronic diseases.    Vaginitis Today her main complaint was itching in vaginal area.  Has not noticed any discharge.  History of bilateral mastectomy for right breast invasive lobular carcinoma Follows with Dr. Lindi Adie On tamoxifen History of osteoporosis last DEXA scan was 11/18 Due for repeat DEXA.  Wanted Dr. Renne Crigler to order it But wants me to order it now Chronic constipation Had colonoscopy done by either GI Reflux-like symptoms Plan for esophageal manometry per Dr. Therisa Doyne in [place] back pain with MRI in 2019 showed degenerative changes On meloxicam has failed to taper History of benign cyst of right inner ear Still has not followed up with Dr. Melene Plan for MRI  Depression with anxiety On Pristiq and Ativan Lost her husband recently and hurt her son diagnosed with colon cancer But is doing better now.  She is independent. Still drives and does not use any cane or walker Past Medical History:  Diagnosis Date  . Anxiety   . Arthritis    "back" (06/05/2018)  . Breast cancer, right breast ([place])  03/2018  . Chronic lower back pain    "if I don't do yoga" (06/05/2018)  . Depression   . Family history of breast cancer   . GERD (gastroesophageal reflux disease)   . 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 History of hiatal hernia   . Macular degeneration    Per [facility] New Patient Packet   . Nephritis    "as a child" (06/05/2018)  . Osteoporosis   . SCC (squamous cell carcinoma)    "under my nose" (06/05/2018)  . Strabismus    Per [place] new patient packet   . UTI (urinary tract infection) 8/15  . Wears glasses     Past Surgical History:  Procedure Laterality Date  . BREAST BIOPSY Right 04/2018  . CATARACT EXTRACTION W/ INTRAOCULAR LENS  IMPLANT, BILATERAL Bilateral   . CESAREAN SECTION  1977  . COLONOSCOPY    . CYST REMOVAL NECK Left 03/03/2014   Procedure: LEFT NECK CYST EXCISION;  Surgeon: Pedro Earls, MD;  Location: [place];  Service: General;  Laterality: Left;  . DILATION AND CURETTAGE OF UTERUS    . EYE SURGERY     Strabismus  . MASTECTOMY Left 06/05/2018  . MASTECTOMY COMPLETE / SIMPLE W/ SENTINEL NODE BIOPSY Right 06/05/2018   AND BLUE DYE INJECTION RIGHT BREAST  . MASTECTOMY W/ SENTINEL NODE BIOPSY Bilateral 06/05/2018   Procedure: BILATERAL MASTECTOMIES WITH RIGHT SENTINEL LYMPH NODE BIOPSY  AND BLUE DYE INJECTION RIGHT BREAST;  Surgeon: Donnie Mesa, MD;  Location: [place];  Service: General;  Laterality: Bilateral;  . SQUAMOUS CELL CARCINOMA EXCISION     "under my nose"  . STRABISMUS SURGERY Bilateral 05/02/2018   Procedure: BILATERAL REPAIR STRABISMUS;  Surgeon: Lamonte Sakai, MD;  Location: [place];  Service: Ophthalmology;  Laterality: Bilateral;  . TONSILLECTOMY AND ADENOIDECTOMY    . UPPER GI ENDOSCOPY    . WISDOM TOOTH EXTRACTION      Allergies  Allergen Reactions  . Bactrim [Sulfamethoxazole-Trimethoprim] Anaphylaxis, Hives and Rash    Outpatient Encounter Medications as of 02/17/2020  Medication Sig  . Brimonidine  Tartrate (LUMIFY) 0.025 % SOLN Place 1 drop into both eyes daily.   Marland Kitchen CALCIUM-MAGNESIUM PO Take 1 tablet by mouth daily.   . Cholecalciferol (VITAMIN D3) 50 MCG (2000 UT) capsule Take 2.5 capsules (5,000 Units total) by mouth daily. (Patient taking differently: Take 2,000 Units by mouth daily. )  . desvenlafaxine (PRISTIQ) 50 MG 24 hr tablet Take 1 tablet (50 mg total) by mouth daily.  . Digestive Enzymes (DIGESTIVE ENZYME PO) Take 1 capsule by mouth 3 (three) times daily.   . fluticasone (FLONASE) 50 MCG/ACT nasal spray Place 2 sprays into both nostrils daily.  . Hypromellose (ARTIFICIAL TEARS OP) Place 1 drop into both eyes 2 (two) times daily.   Marland Kitchen Ketotifen Fumarate (ALAWAY OP) Apply to eye. As needed for allergies  . LORazepam (ATIVAN) 0.5 MG tablet TAKE ONE TABLET AT BEDTIME, MAY TAKE AND EXTRA 1/2 TABLET AS NEEDED.  . Magnesium Gluconate (MAG-G) 500 (27 Mg) MG TABS Take 500 mg by mouth daily.   . melatonin 3 MG TABS tablet Take 3 mg by mouth at bedtime as needed.  . meloxicam (MOBIC) 15 MG tablet TAKE 1 TABLET ONCE DAILY.  . Multiple Vitamins-Minerals (PRESERVISION AREDS) CAPS Take 1 capsule by mouth 2 (two) times daily.   . Nutritional Supplements (CYTO-REDOXIN PO) Take 2 oz by mouth in the morning and at bedtime.  . Omega-3 Fatty Acids (OMEGA 3 PO) Take 1 capsule by mouth daily.   . psyllium (METAMUCIL) 58.6 % packet Take 1 packet by mouth daily.  . sodium chloride (OCEAN) 0.65 % SOLN nasal spray Place 1 spray into both nostrils 2 (two) times daily as needed for congestion.  . tamoxifen (NOLVADEX) 10 MG tablet Take 1.5 tablets (15 mg total) by mouth 2 (two) times daily. (Patient taking differently: Take by mouth 2 (two) times daily. 15 mg  in am and 10 mg in pm)   No facility-administered encounter medications on file as of 02/17/2020.    Review of Systems:  Review of Systems  Constitutional: Negative.   HENT: Negative.   Respiratory: Negative.   Cardiovascular: Negative.     Gastrointestinal: Negative.   Genitourinary: Negative.   Musculoskeletal: Negative.   Skin: Negative.   Neurological: Negative.   Psychiatric/Behavioral: Positive for dysphoric mood.    Health Maintenance  Topic Date Due  . Hepatitis C Screening  Never done  . TETANUS/TDAP  Never done  . COLONOSCOPY  Never done  . PNA vac Low Risk Adult (1 of 2 - PCV13) Never done  . INFLUENZA VACCINE  02/14/2020  . DEXA SCAN  Completed  . COVID-19 Vaccine  Completed  . MAMMOGRAM  Discontinued    Physical Exam: Vitals:   02/17/20 1349  BP: (!) 148/100  Pulse: 86  Temp: 98.2 F (36.8 C)  SpO2: 96%  Weight: 133 lb 12.8  oz (60.7 kg)  Height: 5\' 4"  (1.626 m)   Body mass index is 22.97 kg/m. Physical Exam Vitals reviewed.  Constitutional:      Appearance: Normal appearance.  HENT:     Head: Normocephalic.     Nose: Nose normal.     Mouth/Throat:     Mouth: Mucous membranes are moist.     Pharynx: Oropharynx is clear.  Eyes:     Pupils: Pupils are equal, round, and reactive to light.  Cardiovascular:     Rate and Rhythm: Normal rate and regular rhythm.     Pulses: Normal pulses.  Pulmonary:     Effort: Pulmonary effort is normal.     Breath sounds: Normal breath sounds.  Abdominal:     General: Abdomen is flat. Bowel sounds are normal.     Palpations: Abdomen is soft.  Genitourinary:    Comments: Mild Candida Vaginitis  Musculoskeletal:        General: No swelling.     Cervical back: Neck supple.  Skin:    General: Skin is warm.  Neurological:     General: No focal deficit present.     Mental Status: She is alert and oriented to person, place, and time.  Psychiatric:        Mood and Affect: Mood normal.        Thought Content: Thought content normal.     Labs reviewed: Basic Metabolic Panel: Recent Labs    05/20/19 1623 10/02/19 0831 01/28/20 0000  NA 141 141 140  K 4.7 4.3 4.6  CL 106 107 103  CO2 28 27 28   GLUCOSE 81 84 101*  BUN 17 18 19   CREATININE  0.99* 0.99* 1.09*  CALCIUM 9.8 9.2 10.0  TSH 4.43  --   --    Liver Function Tests: Recent Labs    05/20/19 1623 10/02/19 0831 01/28/20 0000  AST 19 15 17   ALT 18 11 15   BILITOT 0.5 0.5 0.6  PROT 7.0 6.2 6.6   No results for input(s): LIPASE, AMYLASE in the last 8760 hours. No results for input(s): AMMONIA in the last 8760 hours. CBC: Recent Labs    05/20/19 1623 10/02/19 0831 01/28/20 0000  WBC 5.1 5.0 6.2  NEUTROABS 2,382 2,250 2,579  HGB 13.4 12.8 14.3  HCT 40.2 38.9 43.1  MCV 90.1 89.8 90.0  PLT 232 209 229   Lipid Panel: Recent Labs    05/20/19 1623 01/28/20 0000  CHOL 194 195  HDL 80 74  LDLCALC 93 98  TRIG 111 135  CHOLHDL 2.4 2.6   No results found for: HGBA1C  Procedures since last visit: No results found.  Assessment/Plan Candida vaginitis Monostat Suppository for 7 days  Essential Hypertension BP High Today Check it with Facility Nurse Q weekly and Let me know if More then 140/90  Invasive lobular carcinoma of breast, stage 1, right ([place]) On Tamoxifen  Follows with Oncology  Osteoporosis without current pathological fracture, unspecified osteoporosis type Will order DEXA as per request Follows with Rheumatology  Chronic bilateral low back pain without sciatica Takes Meloxicam 15 mg QD Is unable to wean herself from this Told to try with food Reflux Symptoms Has Esphageat Manometry schedueled by [facility] She does not want to take Prilosec or Protonix Told her to try Pepcid OTC Anxiety Doing well with Ativan Moderate episode of recurrent major depressive disorder ([place]) ON Pristiq Vitamin D deficiency Good Levels    Labs/tests ordered:  * No order type specified *  Next appt:  Visit date not found  Total time spent in this patient care encounter was  45_  minutes; greater than 50% of the visit spent counseling patient and staff, reviewing records , Labs and coordinating care for problems addressed at this encounter.

## 2020-02-18 ENCOUNTER — Telehealth: Payer: Self-pay

## 2020-02-18 NOTE — Telephone Encounter (Signed)
Have blood pressure check once a week for 4 couple weeks with Bailey Mech the nurse. She will let us know if more then 140 /90.  DWP. No further questions.

## 2020-02-19 ENCOUNTER — Telehealth: Payer: Self-pay

## 2020-02-19 DIAGNOSIS — Z20822 Contact with and (suspected) exposure to covid-19: Secondary | ICD-10-CM | POA: Diagnosis not present

## 2020-02-19 DIAGNOSIS — R5383 Other fatigue: Secondary | ICD-10-CM | POA: Diagnosis not present

## 2020-02-19 DIAGNOSIS — R519 Headache, unspecified: Secondary | ICD-10-CM | POA: Diagnosis not present

## 2020-02-19 DIAGNOSIS — R52 Pain, unspecified: Secondary | ICD-10-CM | POA: Diagnosis not present

## 2020-02-19 DIAGNOSIS — Z1152 Encounter for screening for COVID-19: Secondary | ICD-10-CM | POA: Diagnosis not present

## 2020-02-19 NOTE — Telephone Encounter (Signed)
Patient called this morning and said she "just found out" that she was exposed to someone COVID+ 13 days ago. She stated she has been vaccinated and had her mask on. When questioned about symptoms ;she states she was seen for a yeast infection yesterday, but is having fatigue, headache, myalgias, and her BP is elevated. She states she is going for testing this afternoon. I asked that she let us know results as soon as possible.

## 2020-02-24 DIAGNOSIS — N898 Other specified noninflammatory disorders of vagina: Secondary | ICD-10-CM | POA: Diagnosis not present

## 2020-02-24 DIAGNOSIS — R309 Painful micturition, unspecified: Secondary | ICD-10-CM | POA: Diagnosis not present

## 2020-02-24 DIAGNOSIS — H10023 Other mucopurulent conjunctivitis, bilateral: Secondary | ICD-10-CM | POA: Diagnosis not present

## 2020-02-24 DIAGNOSIS — N390 Urinary tract infection, site not specified: Secondary | ICD-10-CM | POA: Diagnosis not present

## 2020-03-04 ENCOUNTER — Other Ambulatory Visit: Payer: Self-pay | Admitting: Otolaryngology

## 2020-03-04 DIAGNOSIS — D3705 Neoplasm of uncertain behavior of pharynx: Secondary | ICD-10-CM

## 2020-03-15 ENCOUNTER — Telehealth (INDEPENDENT_AMBULATORY_CARE_PROVIDER_SITE_OTHER): Payer: Medicare PPO | Admitting: Internal Medicine

## 2020-03-15 ENCOUNTER — Other Ambulatory Visit: Payer: Self-pay

## 2020-03-15 ENCOUNTER — Encounter: Payer: Self-pay | Admitting: Internal Medicine

## 2020-03-15 DIAGNOSIS — M810 Age-related osteoporosis without current pathological fracture: Secondary | ICD-10-CM | POA: Diagnosis not present

## 2020-03-15 DIAGNOSIS — E559 Vitamin D deficiency, unspecified: Secondary | ICD-10-CM | POA: Diagnosis not present

## 2020-03-15 NOTE — Progress Notes (Addendum)
Patient ID: Lisa Yoder, female   DOB: Aug 15, 1950, 69 y.o.   MRN: [id number]  Patient location: Home My location: Office Persons participating in the virtual visit: patient, provider  Referring Provider: Virgie Dad, MD  I connected with the patient on 03/15/70 at  3:16 PM EDT by a video enabled telemedicine application and verified that I am speaking with the correct person.   I discussed the limitations of evaluation and management by telemedicine and the availability of in person appointments. The patient expressed understanding and agreed to proceed.   Details of the encounter are shown below.  HPI  Lisa Yoder is a 69 y.o.-year-old female, returning for f/u for osteoporosis. Last visit 1 year and 5 months ago. ObGyn: Dr. Dian Queen.  She now resides in the [place] community.  She moved to therapy for last visit  Reviewed and addended history:  She was diagnosed with osteoporosis in 2006.  No recent falls or fractures.she has a history of a fall on the deck in 2018, but no fractures.  She denies dizziness, vertigo, orthostasis.  Reviewed patient's DXA scan reports along with the patient: Date L1-L4 T score FN T score  05/28/2017 ([place]) (L3): -3.3 (-0.9%) LFN: -2.8 (-2.2%*) RFN: -3.1  05/27/2015 ([place]) -3.0 LFN: -2.7 RFN: -3.1  05/25/2013 ([facility]) -3.5 (-10.4%* c/w 03/14/2009) LFN: -3.4 RFN: -3.5  03/14/2009 -2.8   03/03/2007 -2.7   04/13/2005 -2.5    She was on the following osteoporosis treatments: - Fosamax in 2006 >> for 1-2 years - Actonel >> for 2 more years.  She refused Prolia.  + history of vitamin D deficiency.  Reviewed recent vitamin D levels: Lab Results  Component Value Date   VD25OH 43 10/02/2019   VD25OH 31 05/20/2019   VD25OH 37.68 10/22/2017   VD25OH 31.59 08/28/2016   VD25OH 43.57 09/02/2015   VD25OH 42 10/08/2014  In the past, she was on 5000 units vitamin D daily, but as vitamin D  level was close to the lower limit of normal, I advised her to increase to 7000 units daily >> then 2000 units daily and then we increased to 4000 units daily. This is now followed by Dr. Lyndel Safe >> dose decreased to 2000 IU daily.  She does weightbearing exercises >> going to PT. she does yoga occasionally and walking  She is not on high vitamin A doses.  No history of sustained hypo or hypercalcemia.  No hyperparathyroidism.  No kidney stones. Lab Results  Component Value Date   CALCIUM 10.0 01/28/2020   CALCIUM 9.2 10/02/2019   CALCIUM 9.8 05/20/2019   CALCIUM 9.2 06/06/2018   CALCIUM 9.3 05/28/2018   CALCIUM 9.6 10/22/2017   CALCIUM 10.1 08/28/2016   CALCIUM 9.9 10/08/2014   CALCIUM 10.2 02/15/2014   CALCIUM 8.2 (L) 12/29/2013   No thyrotoxicosis: Lab Results  Component Value Date   TSH 4.43 05/20/2019   TSH 2.471 10/08/2014   TSH 1.710 12/30/2013   No history of CKD.  Latest creatinine was higher: Lab Results  Component Value Date   BUN 19 01/28/2020   CREATININE 1.09 (H) 01/28/2020   Menopause was at 69 years old.  She does have a family history of osteoporosis in her sister, who is on [place].  She also has a history of a small hiatal hernia and dysphagia.  ROS: Constitutional: no weight gain/no weight loss, + fatigue, no subjective hyperthermia, no subjective hypothermia Eyes: no blurry vision, no xerophthalmia ENT: no sore throat,  no nodules palpated in neck, + dysphagia, no odynophagia, no hoarseness Cardiovascular: no CP/no SOB/no palpitations/no leg swelling Respiratory: no cough/no SOB/no wheezing Gastrointestinal: no N/no V/no D/no C/no acid reflux Musculoskeletal: no muscle aches/no joint aches Skin: no rashes, no hair loss Neurological: no tremors/no numbness/no tingling/no dizziness  I reviewed pt's medications, allergies, PMH, social hx, family hx, and changes were documented in the history of present illness. Otherwise, unchanged from my initial visit  note.   Past Medical History:  Diagnosis Date   Anxiety    Arthritis    "back" (06/05/2018)   Breast cancer, right breast ([place]) 03/2018   Chronic lower back pain    "if I don't do yoga" (06/05/2018)   Depression    Family history of breast cancer    GERD (gastroesophageal reflux disease)    Herpes simplex    on nose   History of hiatal hernia    Macular degeneration    Per [facility] New Patient Packet    Nephritis    "as a child" (06/05/2018)   Osteoporosis    SCC (squamous cell carcinoma)    "under my nose" (06/05/2018)   Strabismus    Per [facility] new patient packet    UTI (urinary tract infection) 8/15   Wears glasses    Past Surgical History:  Procedure Laterality Date   BREAST BIOPSY Right 04/2018   CATARACT EXTRACTION W/ INTRAOCULAR LENS  IMPLANT, BILATERAL Bilateral    CESAREAN SECTION  1977   COLONOSCOPY     CYST REMOVAL NECK Left 03/03/2014   Procedure: LEFT NECK CYST EXCISION;  Surgeon: Pedro Earls, MD;  Location: [place];  Service: General;  Laterality: Left;   DILATION AND CURETTAGE OF UTERUS     EYE SURGERY     Strabismus   MASTECTOMY Left 06/05/2018   MASTECTOMY COMPLETE / SIMPLE W/ SENTINEL NODE BIOPSY Right 06/05/2018   AND BLUE DYE INJECTION RIGHT BREAST   MASTECTOMY W/ SENTINEL NODE BIOPSY Bilateral 06/05/2018   Procedure: BILATERAL MASTECTOMIES WITH RIGHT SENTINEL LYMPH NODE BIOPSY AND BLUE DYE INJECTION RIGHT BREAST;  Surgeon: Donnie Mesa, MD;  Location: [place];  Service: General;  Laterality: Bilateral;   SQUAMOUS CELL CARCINOMA EXCISION     "under my nose"   STRABISMUS SURGERY Bilateral 05/02/2018   Procedure: BILATERAL REPAIR STRABISMUS;  Surgeon: Lamonte Sakai, MD;  Location: [place];  Service: Ophthalmology;  Laterality: Bilateral;   TONSILLECTOMY AND ADENOIDECTOMY     UPPER GI ENDOSCOPY     WISDOM TOOTH EXTRACTION     History   Social History   Marital Status: Married     Spouse Name: N/A   Number of Children: 2   Occupational History    Psychologist, clinical   Social History Main Topics   Smoking status: Former Smoker    Types: Cigarettes    Quit date:  1986    Smokeless tobacco: Never Used   Alcohol Use: Yes   Drug Use: No   Current Outpatient Medications on File Prior to Visit  Medication Sig Dispense Refill   Brimonidine Tartrate (LUMIFY) 0.025 % SOLN Place 1 drop into both eyes daily.      CALCIUM-MAGNESIUM PO Take 1 tablet by mouth daily.      Cholecalciferol (VITAMIN D3) 50 MCG (2000 UT) capsule Take 2.5 capsules (5,000 Units total) by mouth daily. (Patient taking differently: Take 2,000 Units by mouth daily. )     desvenlafaxine (PRISTIQ) 50 MG 24 hr tablet Take  1 tablet (50 mg total) by mouth daily. 30 tablet 3   Digestive Enzymes (DIGESTIVE ENZYME PO) Take 1 capsule by mouth 3 (three) times daily.      fluticasone (FLONASE) 50 MCG/ACT nasal spray Place 2 sprays into both nostrils daily. 16 g 6   Hypromellose (ARTIFICIAL TEARS OP) Place 1 drop into both eyes 2 (two) times daily.      Ketotifen Fumarate (ALAWAY OP) Apply to eye. As needed for allergies     LORazepam (ATIVAN) 0.5 MG tablet TAKE ONE TABLET AT BEDTIME, MAY TAKE AND EXTRA 1/2 TABLET AS NEEDED. 45 tablet 0   Magnesium Gluconate (MAG-G) 500 (27 Mg) MG TABS Take 500 mg by mouth daily.      melatonin 3 MG TABS tablet Take 3 mg by mouth at bedtime as needed.     meloxicam (MOBIC) 15 MG tablet TAKE 1 TABLET ONCE DAILY. 30 tablet 5   Multiple Vitamins-Minerals (PRESERVISION AREDS) CAPS Take 1 capsule by mouth 2 (two) times daily.      Nutritional Supplements (CYTO-REDOXIN PO) Take 2 oz by mouth in the morning and at bedtime.     Omega-3 Fatty Acids (OMEGA 3 PO) Take 1 capsule by mouth daily.      psyllium (METAMUCIL) 58.6 % packet Take 1 packet by mouth daily.     sodium chloride (OCEAN) 0.65 % SOLN nasal spray Place 1 spray into both nostrils 2 (two) times  daily as needed for congestion.     tamoxifen (NOLVADEX) 10 MG tablet Take 1.5 tablets (15 mg total) by mouth 2 (two) times daily. (Patient taking differently: Take by mouth 2 (two) times daily. 15 mg  in am and 10 mg in pm) 120 tablet 3   No current facility-administered medications on file prior to visit.   Allergies  Allergen Reactions   Bactrim [Sulfamethoxazole-Trimethoprim] Anaphylaxis, Hives and Rash   Family History  Problem Relation Age of Onset   Breast cancer Mother 71   Hypertension Father    Mental illness Sister    Osteoporosis Sister    Seizures Sister    Suicidality Sister    Cancer Maternal Grandfather        blood cancer?   Breast cancer Cousin        dx under 57   Cancer Paternal Aunt        type unk   Cancer Other        type unk   Cancer Son    PE: There were no vitals taken for this visit. There is no height or weight on file to calculate BMI. Wt Readings from Last 3 Encounters:  02/17/20 133 lb 12.8 oz (60.7 kg)  10/07/19 135 lb 3.2 oz (61.3 kg)  06/22/19 134 lb 1.6 oz (60.8 kg)   Constitutional:  in NAD  The physical exam was not performed (virtual visit).  Assessment: 1. Osteoporosis  2.  History of vitamin D deficiency  Plan: 1. Osteoporosis  Likely menopausal/age-related, worse after stopping p.o. bisphosphonates, which she tolerated well.  She does have a history of hiatal hernia and dysphagia, though  Her latest bone density scan available for review is from 05/2017: T scores were fairly stable, however, she does have significant osteoporosis.  I suggested Prolia in the past but she refused after discussion about benefits and possible side effects.  Before last visit, she was diagnosed with breast cancer and was started on tamoxifen.  We did discuss that this is beneficial for the bones, as opposed  to aromatase inhibitors  Because she continues to exercise and supplement vitamin D.  I did advise her in the past to increase  weightbearing exercises as she was only doing them once a week.  At last visit, she reported that she started to work with the physical therapist and did more weightbearing exercises, less yoga and more walking.  She is due for another bone mineral density -I ordered this but she did not have it yet -reordered at this visit and patient was given the telephone number for the facility to schedule it  I will get in touch with her about the results of her BMD  I will see her back in a year  2. H/o vit D def -She was on 4000 units vitamin D daily, increased from 2000 units before last visit -Reviewed her latest vitamin D level and this was normal, at 43 on 10/02/2019 -However, patient tells me that bone density was checked again by her PCP and after this, her vitamin D supplement was decreased again to 2000 units daily.  She continues on this dose.  Orders Placed This Encounter  Procedures   DG Bone Density   DXA 03/16/2020 ([facility]) Lumbar spine L1-L4(L3) Femoral neck (FN)  T-score   -2.7 RFN: -2.9 LFN: -2.9  Change in BMD from previous DXA test (%) Up 9.3%* Up 1.3%  (*) statistically significant  Excellent improvement in her spine BMD, possibly due to tamoxifen and increasing exercise.  Hip BMD overall stable.  Philemon Kingdom, MD PhD [facility] Endocrinology

## 2020-03-15 NOTE — Patient Instructions (Signed)
Please contact [facility] to schedule the bone density - [phone].  Please come back for a follow-up appointment in 1 year.

## 2020-03-16 ENCOUNTER — Ambulatory Visit (HOSPITAL_COMMUNITY): Admission: RE | Admit: 2020-03-16 | Payer: Medicare PPO | Source: Home / Self Care | Admitting: Gastroenterology

## 2020-03-16 ENCOUNTER — Encounter (HOSPITAL_COMMUNITY): Admission: RE | Payer: Self-pay | Source: Home / Self Care

## 2020-03-16 SURGERY — MANOMETRY, ESOPHAGUS

## 2020-03-22 ENCOUNTER — Ambulatory Visit (INDEPENDENT_AMBULATORY_CARE_PROVIDER_SITE_OTHER)
Admission: RE | Admit: 2020-03-22 | Discharge: 2020-03-22 | Disposition: A | Discharge status: Home / Self Care | Payer: Medicare PPO | Source: Ambulatory Visit | Attending: Internal Medicine | Admitting: Internal Medicine

## 2020-03-22 ENCOUNTER — Other Ambulatory Visit: Payer: Self-pay

## 2020-03-22 DIAGNOSIS — M810 Age-related osteoporosis without current pathological fracture: Secondary | ICD-10-CM | POA: Diagnosis not present

## 2020-04-01 ENCOUNTER — Other Ambulatory Visit: Payer: Self-pay

## 2020-04-01 ENCOUNTER — Ambulatory Visit
Admission: RE | Admit: 2020-04-01 | Discharge: 2020-04-01 | Disposition: A | Discharge status: Home / Self Care | Payer: Medicare PPO | Source: Ambulatory Visit | Attending: Otolaryngology | Admitting: Otolaryngology

## 2020-04-01 ENCOUNTER — Other Ambulatory Visit: Payer: Self-pay | Admitting: Internal Medicine

## 2020-04-01 DIAGNOSIS — D3705 Neoplasm of uncertain behavior of pharynx: Secondary | ICD-10-CM

## 2020-04-01 DIAGNOSIS — J392 Other diseases of pharynx: Secondary | ICD-10-CM | POA: Diagnosis not present

## 2020-04-01 MED ORDER — GADOBENATE DIMEGLUMINE 529 MG/ML IV SOLN
12.0000 mL | Freq: Once | INTRAVENOUS | Status: AC | PRN
  Administered 2020-04-01: 12 mL via INTRAVENOUS

## 2020-04-01 NOTE — Telephone Encounter (Signed)
Refill request received. Medication pended and sent to Dr. Lyndel Safe for approval.

## 2020-04-22 ENCOUNTER — Other Ambulatory Visit: Payer: Self-pay | Admitting: Hematology and Oncology

## 2020-05-03 DIAGNOSIS — L820 Inflamed seborrheic keratosis: Secondary | ICD-10-CM | POA: Diagnosis not present

## 2020-05-03 DIAGNOSIS — L821 Other seborrheic keratosis: Secondary | ICD-10-CM | POA: Diagnosis not present

## 2020-05-09 ENCOUNTER — Other Ambulatory Visit: Payer: Self-pay | Admitting: Internal Medicine

## 2020-05-26 DIAGNOSIS — R309 Painful micturition, unspecified: Secondary | ICD-10-CM | POA: Diagnosis not present

## 2020-05-26 DIAGNOSIS — N760 Acute vaginitis: Secondary | ICD-10-CM | POA: Diagnosis not present

## 2020-06-02 DIAGNOSIS — Z853 Personal history of malignant neoplasm of breast: Secondary | ICD-10-CM | POA: Diagnosis not present

## 2020-06-02 DIAGNOSIS — Z6824 Body mass index (BMI) 24.0-24.9, adult: Secondary | ICD-10-CM | POA: Diagnosis not present

## 2020-06-02 DIAGNOSIS — Z01419 Encounter for gynecological examination (general) (routine) without abnormal findings: Secondary | ICD-10-CM | POA: Diagnosis not present

## 2020-06-13 DIAGNOSIS — H353132 Nonexudative age-related macular degeneration, bilateral, intermediate dry stage: Secondary | ICD-10-CM | POA: Diagnosis not present

## 2020-06-13 DIAGNOSIS — H52203 Unspecified astigmatism, bilateral: Secondary | ICD-10-CM | POA: Diagnosis not present

## 2020-06-13 DIAGNOSIS — H524 Presbyopia: Secondary | ICD-10-CM | POA: Diagnosis not present

## 2020-06-13 DIAGNOSIS — H43813 Vitreous degeneration, bilateral: Secondary | ICD-10-CM | POA: Diagnosis not present

## 2020-06-15 ENCOUNTER — Encounter: Payer: Self-pay | Admitting: Family

## 2020-06-15 ENCOUNTER — Other Ambulatory Visit: Payer: Self-pay

## 2020-06-15 ENCOUNTER — Telehealth: Payer: Self-pay | Admitting: Family

## 2020-06-15 ENCOUNTER — Ambulatory Visit (INDEPENDENT_AMBULATORY_CARE_PROVIDER_SITE_OTHER): Payer: Medicare PPO | Admitting: Family

## 2020-06-15 VITALS — BP 140/90 | HR 96 | Temp 97.8°F | Resp 16 | Ht 64.0 in | Wt 131.8 lb

## 2020-06-15 DIAGNOSIS — Z20822 Contact with and (suspected) exposure to covid-19: Secondary | ICD-10-CM | POA: Diagnosis not present

## 2020-06-15 DIAGNOSIS — R52 Pain, unspecified: Secondary | ICD-10-CM

## 2020-06-15 DIAGNOSIS — R5383 Other fatigue: Secondary | ICD-10-CM

## 2020-06-15 DIAGNOSIS — R519 Headache, unspecified: Secondary | ICD-10-CM | POA: Diagnosis not present

## 2020-06-15 DIAGNOSIS — Z1152 Encounter for screening for COVID-19: Secondary | ICD-10-CM | POA: Diagnosis not present

## 2020-06-15 LAB — COMPLETE METABOLIC PANEL WITH GFR
AG Ratio: 1.5 (calc) (ref 1.0–2.5)
ALT: 14 U/L (ref 6–29)
AST: 15 U/L (ref 10–35)
Albumin: 3.8 g/dL (ref 3.6–5.1)
Alkaline phosphatase (APISO): 56 U/L (ref 37–153)
BUN: 15 mg/dL (ref 7–25)
CO2: 31 mmol/L (ref 20–32)
Calcium: 9.5 mg/dL (ref 8.6–10.4)
Chloride: 103 mmol/L (ref 98–110)
Creat: 0.85 mg/dL (ref 0.60–0.93)
GFR, Est African American: 80 mL/min/{1.73_m2} (ref >=60)
GFR, Est Non African American: 69 mL/min/{1.73_m2} (ref >=60)
Globulin: 2.6 g/dL (calc) (ref 1.9–3.7)
Glucose, Bld: 96 mg/dL (ref 65–99)
Potassium: 3.9 mmol/L (ref 3.5–5.3)
Sodium: 138 mmol/L (ref 135–146)
Total Bilirubin: 0.3 mg/dL (ref 0.2–1.2)
Total Protein: 6.4 g/dL (ref 6.1–8.1)

## 2020-06-15 LAB — CBC WITH DIFFERENTIAL/PLATELET
Absolute Monocytes: 925 cells/uL (ref 200–950)
Basophils Absolute: 47 cells/uL (ref 0–200)
Basophils Relative: 0.7 %
Eosinophils Absolute: 141 cells/uL (ref 15–500)
Eosinophils Relative: 2.1 %
HCT: 37.3 % (ref 35.0–45.0)
Hemoglobin: 12.6 g/dL (ref 11.7–15.5)
Lymphs Abs: 1286 cells/uL (ref 850–3900)
MCH: 29.9 pg (ref 27.0–33.0)
MCHC: 33.8 g/dL (ref 32.0–36.0)
MCV: 88.6 fL (ref 80.0–100.0)
MPV: 11.3 fL (ref 7.5–12.5)
Monocytes Relative: 13.8 %
Neutro Abs: 4301 cells/uL (ref 1500–7800)
Neutrophils Relative %: 64.2 %
Platelets: 177 10*3/uL (ref 140–400)
RBC: 4.21 10*6/uL (ref 3.80–5.10)
RDW: 11.9 % (ref 11.0–15.0)
Total Lymphocyte: 19.2 %
WBC: 6.7 10*3/uL (ref 3.8–10.8)

## 2020-06-15 LAB — POCT INFLUENZA A/B
Influenza A, POC: NEGATIVE
Influenza B, POC: NEGATIVE

## 2020-06-15 NOTE — Telephone Encounter (Signed)
Noted  

## 2020-06-15 NOTE — Telephone Encounter (Signed)
Patient's stat CBC/diff and CMP results received via fax.Result within normal range.patient called at 5:08 and notified of results.

## 2020-06-15 NOTE — Progress Notes (Signed)
Provider: Froilan Mclean FNP-C  Virgie Dad, MD  Patient Care Team: Virgie Dad, MD as PCP - General (Internal Medicine) Ronnette Juniper, MD as Consulting Physician (Gastroenterology) Phylliss Bob, MD as Consulting Physician (Orthopedic Surgery) Nicholas Lose, MD as Consulting Physician (Hematology and Oncology) Dian Queen, MD as Consulting Physician (Obstetrics and Gynecology) Marygrace Drought, MD as Consulting Physician (Ophthalmology) Leta Baptist, MD as Consulting Physician (Otolaryngology)  Extended Emergency Contact Information Primary Emergency Contact: Krystena, Reitter Mobile Phone: [phone] Relation: Daughter  Code Status:Full Code  Goals of care: Advanced Directive information Advanced Directives 06/15/2020  Does Patient Have a Medical Advance Directive? Yes  Type of Advance Directive [place]  Does patient want to make changes to medical advance directive? No - Patient declined  Copy of [place] in Chart? Yes - validated most recent copy scanned in chart (See row information)  Would patient like information on creating a medical advance directive? -  Pre-existing out of facility DNR order (yellow form or pink MOST form) -     Chief Complaint  Patient presents with  . Acute Visit    body aches,severe headache and fatigue x 3 days    HPI:  Pt is a 69 y.o. female seen today for an acute visit for evaluation of body aches,severe headache and fatigue x 3 days.No severe headache today.Has taken ibuprofen every 4 hrs.Has not taken since 2:30 am.but does relief the pain.states went out to son's house on thanksgiving and everyone wore a mask.tolerating soups.she denies any cough,shortness of breath, fever,chills,nausea,vomiting or diarrhea.Has had no loss of smell or taste B/p 140/90  Past Medical History:  Diagnosis Date  . Anxiety   . Arthritis    "back" (06/05/2018)  . Breast cancer, right breast ([place]) 03/2018  .  Chronic lower back pain    "if I don't do yoga" (06/05/2018)  . Depression   . Family history of breast cancer   . GERD (gastroesophageal reflux disease)   . 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 History of hiatal hernia   . Macular degeneration    Per [facility] New Patient Packet   . Nephritis    "as a child" (06/05/2018)  . Osteoporosis   . SCC (squamous cell carcinoma)    "under my nose" (06/05/2018)  . Strabismus    Per [place] new patient packet   . UTI (urinary tract infection) 8/15  . Wears glasses    Past Surgical History:  Procedure Laterality Date  . BREAST BIOPSY Right 04/2018  . CATARACT EXTRACTION W/ INTRAOCULAR LENS  IMPLANT, BILATERAL Bilateral   . CESAREAN SECTION  1977  . COLONOSCOPY    . CYST REMOVAL NECK Left 03/03/2014   Procedure: LEFT NECK CYST EXCISION;  Surgeon: Pedro Earls, MD;  Location: [facility];  Service: General;  Laterality: Left;  . DILATION AND CURETTAGE OF UTERUS    . EYE SURGERY     Strabismus  . MASTECTOMY Left 06/05/2018  . MASTECTOMY COMPLETE / SIMPLE W/ SENTINEL NODE BIOPSY Right 06/05/2018   AND BLUE DYE INJECTION RIGHT BREAST  . MASTECTOMY W/ SENTINEL NODE BIOPSY Bilateral 06/05/2018   Procedure: BILATERAL MASTECTOMIES WITH RIGHT SENTINEL LYMPH NODE BIOPSY AND BLUE DYE INJECTION RIGHT BREAST;  Surgeon: Donnie Mesa, MD;  Location: [facility];  Service: General;  Laterality: Bilateral;  . SQUAMOUS CELL CARCINOMA EXCISION     "under my nose"  . STRABISMUS SURGERY Bilateral 05/02/2018   Procedure: BILATERAL REPAIR STRABISMUS;  Surgeon: Lamonte Sakai, MD;  Location: [facility];  Service: Ophthalmology;  Laterality: Bilateral;  . TONSILLECTOMY AND ADENOIDECTOMY    . UPPER GI ENDOSCOPY    . WISDOM TOOTH EXTRACTION      Allergies  Allergen Reactions  . Bactrim [Sulfamethoxazole-Trimethoprim] Anaphylaxis, Hives and Rash    Outpatient Encounter Medications as of 06/15/2020  Medication Sig  . CALCIUM-MAGNESIUM PO Take 1  tablet by mouth daily.   . Cholecalciferol (VITAMIN D3) 50 MCG (2000 UT) capsule Take 2.5 capsules (5,000 Units total) by mouth daily. (Patient taking differently: Take 2,000 Units by mouth daily. )  . desvenlafaxine (PRISTIQ) 50 MG 24 hr tablet Take 1 tablet (50 mg total) by mouth daily.  . Digestive Enzymes (DIGESTIVE ENZYME PO) Take 1 capsule by mouth 3 (three) times daily.   . fluticasone (FLONASE) 50 MCG/ACT nasal spray Place 2 sprays into both nostrils daily.  . Hypromellose (ARTIFICIAL TEARS OP) Place 1 drop into both eyes 2 (two) times daily.   Marland Kitchen Ketotifen Fumarate (ALAWAY OP) Apply to eye. As needed for allergies  . LORazepam (ATIVAN) 0.5 MG tablet TAKE ONE TABLET AT BEDTIME, MAY TAKE AND EXTRA 1/2 TABLET AS NEEDED.  . Magnesium Gluconate (MAG-G) 500 (27 Mg) MG TABS Take 500 mg by mouth daily.   . melatonin 3 MG TABS tablet Take 3 mg by mouth at bedtime as needed.  . meloxicam (MOBIC) 15 MG tablet TAKE 1 TABLET ONCE DAILY.  . Multiple Vitamins-Minerals (PRESERVISION AREDS) CAPS Take 1 capsule by mouth 2 (two) times daily.   . Nutritional Supplements (CYTO-REDOXIN PO) Take 2 oz by mouth in the morning and at bedtime.  . Omega-3 Fatty Acids (OMEGA 3 PO) Take 1 capsule by mouth daily.   . psyllium (METAMUCIL) 58.6 % packet Take 1 packet by mouth daily.  . sodium chloride (OCEAN) 0.65 % SOLN nasal spray Place 1 spray into both nostrils 2 (two) times daily as needed for congestion.  . tamoxifen (NOLVADEX) 10 MG tablet TAKE 1 & 1/2 TABLETS TWICE DAILY.  . [DISCONTINUED] Brimonidine Tartrate (LUMIFY) 0.025 % SOLN Place 1 drop into both eyes daily.    No facility-administered encounter medications on file as of 06/15/2020.    Review of Systems  Constitutional: Positive for fatigue. Negative for appetite change and fever.       Had chills two days ago but not today.generalized aches   HENT: Positive for postnasal drip. Negative for congestion, ear discharge, rhinorrhea, sinus pressure, sinus  pain, sneezing, sore throat and trouble swallowing.        Right ear pain that comes and goes for several weeks  Eyes: Negative for discharge, redness, itching and visual disturbance.  Respiratory: Negative for cough, chest tightness, shortness of breath and wheezing.   Cardiovascular: Negative for chest pain, palpitations and leg swelling.  Gastrointestinal: Negative for abdominal distention, abdominal pain, constipation, diarrhea, nausea and vomiting.  Genitourinary: Negative for difficulty urinating, dysuria, flank pain, frequency and urgency.  Musculoskeletal: Negative for arthralgias and gait problem.  Skin: Negative for color change, pallor and rash.  Neurological: Positive for headaches. Negative for dizziness, seizures, speech difficulty, weakness, light-headedness and numbness.  Psychiatric/Behavioral: Negative for agitation and sleep disturbance. The patient is not nervous/anxious.     Immunization History  Administered Date(s) Administered  . Fluad Quad(high Dose 65+) 03/31/2019  . Influenza, High Dose Seasonal PF 05/04/2017, 04/18/2018  . Influenza-Unspecified 03/30/2011  . Moderna SARS-COVID-2 Vaccination 07/20/2019, 08/17/2019  . PPD Test 07/05/2016  . Zoster Recombinat (  Shingrix) 01/22/2018, 03/28/2018   Pertinent  Health Maintenance Due  Topic Date Due  . PNA vac Low Risk Adult (1 of 2 - PCV13) Never done  . INFLUENZA VACCINE  02/14/2020  . COLONOSCOPY  12/22/2029  . DEXA SCAN  Completed  . MAMMOGRAM  Discontinued   Fall Risk  06/15/2020 02/17/2020 05/27/2019 05/27/2019 10/08/2014  Falls in the past year? 0 0 0 0 No  Number falls in past yr: 0 0 0 0 -  Injury with Fall? 0 - - - -   Functional Status Survey:    Vitals:   06/15/20 1054  BP: 140/90  Pulse: 96  Resp: 16  Temp: 97.8 F (36.6 C)  SpO2: 97%  Weight: 131 lb 12.8 oz (59.8 kg)  Height: _0  (1.626 m)   Body mass index is 22.62 kg/m. Physical Exam Constitutional:      General: She is not in  acute distress.    Appearance: She is not ill-appearing.  HENT:     Right Ear: Tympanic membrane, ear canal and external ear normal. There is no impacted cerumen.     Left Ear: Tympanic membrane, ear canal and external ear normal. There is no impacted cerumen.     Nose: Nose normal. No congestion or rhinorrhea.     Mouth/Throat:     Mouth: Mucous membranes are moist.     Pharynx: Oropharynx is clear. No oropharyngeal exudate or posterior oropharyngeal erythema.  Eyes:     General: No scleral icterus.       Right eye: No discharge.        Left eye: No discharge.     Conjunctiva/sclera: Conjunctivae normal.     Pupils: Pupils are equal, round, and reactive to light.  Neck:     Vascular: No carotid bruit.  Cardiovascular:     Rate and Rhythm: Normal rate and regular rhythm.     Pulses: Normal pulses.     Heart sounds: Normal heart sounds. No murmur heard.  No friction rub. No gallop.   Pulmonary:     Effort: Pulmonary effort is normal. No respiratory distress.     Breath sounds: Normal breath sounds. No wheezing, rhonchi or rales.  Chest:     Chest wall: No tenderness.  Abdominal:     General: Bowel sounds are normal. There is no distension.     Palpations: Abdomen is soft. There is no mass.     Tenderness: There is no abdominal tenderness. There is no right CVA tenderness, left CVA tenderness, guarding or rebound.  Musculoskeletal:        General: No swelling or tenderness. Normal range of motion.     Cervical back: Normal range of motion. No rigidity or tenderness.     Right lower leg: No edema.     Left lower leg: No edema.  Lymphadenopathy:     Cervical: No cervical adenopathy.  Skin:    General: Skin is warm and dry.     Coloration: Skin is not pale.     Findings: No bruising, erythema or rash.  Neurological:     Mental Status: She is alert and oriented to person, place, and time.     Cranial Nerves: No cranial nerve deficit.     Sensory: No sensory deficit.     Motor:  No weakness.     Coordination: Coordination normal.     Gait: Gait normal.  Psychiatric:        Mood and Affect: Mood normal.  Behavior: Behavior normal.        Thought Content: Thought content normal.        Judgment: Judgment normal.     Labs reviewed: Recent Labs    10/02/19 0831 01/28/20 0000  NA 141 140  K 4.3 4.6  CL 107 103  CO2 27 28  GLUCOSE 84 101*  BUN 18 19  CREATININE 0.99* 1.09*  CALCIUM 9.2 10.0   Recent Labs    10/02/19 0831 01/28/20 0000  AST 15 17  ALT 11 15  BILITOT 0.5 0.6  PROT 6.2 6.6   Recent Labs    10/02/19 0831 01/28/20 0000  WBC 5.0 6.2  NEUTROABS 2,250 2,579  HGB 12.8 14.3  HCT 38.9 43.1  MCV 89.8 90.0  PLT 209 229   Lab Results  Component Value Date   TSH 4.43 05/20/2019   No results found for: HGBA1C Lab Results  Component Value Date   CHOL 195 01/28/2020   HDL 74 01/28/2020   LDLCALC 98 01/28/2020   TRIG 135 01/28/2020   CHOLHDL 2.6 01/28/2020    Significant Diagnostic Results in last 30 days:  No results found.  Assessment/Plan  1. Generalized Body aches Afebrile.condition stable.  - SARS-COV-2 RNA,(COVID-19) QUAL NAAT - POC Influenza A/B results are negative. - encouraged to take tylenol 500 mg tablet one by mouth every 6 hrs as needed for generalized body aches.  Aware to notify provider if symptoms worsen or go to ED. - CBC with Differential/Platelet stat  - CMP with eGFR([facility]) stat   2.Headache Report severe HA x 2 days ago with body aches.No severe headache during visit.No loss of smell or taste.suspect due to her generalized   Take tylenol 500 mg tablet one by mouth every 6 hrs as needed - SARS-COV-2 RNA,(COVID-19) QUAL NAAT - POC Influenza A/B results are negative. - CBC with Differential/Platelet  3. Fatigue, unspecified type Encouraged to increase fluid intake and soups if not able to eat well . - SARS-COV-2 RNA,(COVID-19) QUAL NAAT - POC Influenza A/B results negative for Influenza.  -  CBC with Differential/Platelet stat  - CMP with eGFR([facility]) stat   Family/ staff Communication: Reviewed plan of care with patient  Labs/tests ordered: - CBC with Differential/Platelet stat  - CMP with eGFR([facility]) stat   Next Appointment: Has appointment with Dr.Gupta 06/22/2020  Sandrea Hughs, NP

## 2020-06-15 NOTE — Patient Instructions (Addendum)
-   take Tylenol 500 mg tablet one by mouth every 6 hours as needed for generalized body aches,fever or headache may continue with ibuprofen as needed but take it with food.  - Notify provider or go to Ed if symptoms worsen or fail to improve  - Increase fluid intake   - Labs drawn today will call you with results   - check Blood pressure daily and record.Notify provider if readings consistently above 140

## 2020-06-16 ENCOUNTER — Other Ambulatory Visit: Payer: Self-pay | Admitting: Hematology and Oncology

## 2020-06-16 LAB — SARS-COV-2 RNA,(COVID-19) QUALITATIVE NAAT: SARS CoV2 RNA: NOT DETECTED

## 2020-06-20 ENCOUNTER — Telehealth: Payer: Self-pay | Admitting: Internal Medicine

## 2020-06-20 NOTE — Telephone Encounter (Signed)
Patient states that she was seen last Wednesday for lab work and flu shot.  She has an appointment on 06/22/20 with Dr. Lyndel Safe and she wants to know if she should keep this appointment.  Please call patient.  Thank you   [employer]

## 2020-06-20 NOTE — Telephone Encounter (Signed)
If she is feeling better she does not need to come and see me. We can change her appointment to Next month.

## 2020-06-20 NOTE — Telephone Encounter (Signed)
Called and [facility] for patient to return call to reschedule apnt if she would like to wait until next month to be seen again.

## 2020-06-21 ENCOUNTER — Ambulatory Visit: Payer: Medicare Other | Admitting: Hematology and Oncology

## 2020-06-22 ENCOUNTER — Encounter: Payer: Self-pay | Admitting: Internal Medicine

## 2020-06-28 NOTE — Progress Notes (Signed)
Patient Care Team: Virgie Dad, MD as PCP - General (Internal Medicine) Ronnette Juniper, MD as Consulting Physician (Gastroenterology) Phylliss Bob, MD as Consulting Physician (Orthopedic Surgery) Nicholas Lose, MD as Consulting Physician (Hematology and Oncology) Dian Queen, MD as Consulting Physician (Obstetrics and Gynecology) Marygrace Drought, MD as Consulting Physician (Ophthalmology) Leta Baptist, MD as Consulting Physician (Otolaryngology)  DIAGNOSIS:    ICD-10-CM   1. Malignant neoplasm of upper-outer quadrant of right breast in female, estrogen receptor positive ([place])  C50.411    Z17.0     SUMMARY OF ONCOLOGIC HISTORY: Oncology History  Malignant neoplasm of upper-outer quadrant of right breast in female, estrogen receptor positive ([place])  05/05/2018 Initial Diagnosis   Screening detected right breast mass, MRI revealed UIQ 1.5 x 0.8 x 0.8 cm mass.  Additional masses 5 mm size and 7 mm size and several smaller enhancing masses in the right breast, biopsy of the 1.5 cm mass invasive lobular cancer with LCIS ER 20%, PR 20%, Ki-67 less than 1%, HER-2 negative; biopsy of additional masses showed ALH and ILC with LCIS ER 100%, PR 70%, Ki-67 5%, HER-2 1+ negative   05/08/2018 -  Anti-estrogen oral therapy   Tamoxifen, plan 5-10 years   06/05/2018 Surgery   Right mastectomy: Invasive lobular carcinoma, multifocal, 1.2 cm is the largest tumor, grade 1, margins negative, 0/1 lymph node, ER 100%, PR 70%, HER-2 -1+, Ki-67 5% T1c N0 stage Ia Left mastectomy: Benign   06/18/2018 Cancer Staging   Staging form: Breast, AJCC 8th Edition - Pathologic: Stage IA (pT1c(m), pN0, cM0, G1, ER+, PR+, HER2-) - Signed by Gardenia Phlegm, NP on 06/18/2018   06/19/2018 Oncotype testing   Oncotype: score of 11 with 3% chance of distant recurrence in 9 years with tamoxifen alone     CHIEF COMPLIANT: Follow-up of right breast cancer on tamoxifen  INTERVAL HISTORY: Lisa Yoder is a  69 y.o. with above-mentioned history of right breast cancer who underwent bilateral mastectomies and is currently on tamoxifen. She presents to the clinic today for annual follow-up   ALLERGIES:  is allergic to bactrim [sulfamethoxazole-trimethoprim].  MEDICATIONS:  Current Outpatient Medications  Medication Sig Dispense Refill  . CALCIUM-MAGNESIUM PO Take 1 tablet by mouth daily.     . Cholecalciferol (VITAMIN D3) 50 MCG (2000 UT) capsule Take 2.5 capsules (5,000 Units total) by mouth daily. (Patient taking differently: Take 2,000 Units by mouth daily. )    . desvenlafaxine (PRISTIQ) 50 MG 24 hr tablet Take 1 tablet (50 mg total) by mouth daily. 30 tablet 3  . Digestive Enzymes (DIGESTIVE ENZYME PO) Take 1 capsule by mouth 3 (three) times daily.     . fluticasone (FLONASE) 50 MCG/ACT nasal spray Place 2 sprays into both nostrils daily. 16 g 6  . Hypromellose (ARTIFICIAL TEARS OP) Place 1 drop into both eyes 2 (two) times daily.     Marland Kitchen Ketotifen Fumarate (ALAWAY OP) Apply to eye. As needed for allergies    . LORazepam (ATIVAN) 0.5 MG tablet TAKE ONE TABLET AT BEDTIME, MAY TAKE AND EXTRA 1/2 TABLET AS NEEDED. 45 tablet 0  . Magnesium Gluconate (MAG-G) 500 (27 Mg) MG TABS Take 500 mg by mouth daily.     . melatonin 3 MG TABS tablet Take 3 mg by mouth at bedtime as needed.    . meloxicam (MOBIC) 15 MG tablet TAKE 1 TABLET ONCE DAILY. 30 tablet 5  . Multiple Vitamins-Minerals (PRESERVISION AREDS) CAPS Take 1 capsule by mouth 2 (two) times  daily.     . Nutritional Supplements (CYTO-REDOXIN PO) Take 2 oz by mouth in the morning and at bedtime.    . Omega-3 Fatty Acids (OMEGA 3 PO) Take 1 capsule by mouth daily.     . psyllium (METAMUCIL) 58.6 % packet Take 1 packet by mouth daily.    . sodium chloride (OCEAN) 0.65 % SOLN nasal spray Place 1 spray into both nostrils 2 (two) times daily as needed for congestion.    . tamoxifen (NOLVADEX) 10 MG tablet TAKE 1 & 1/2 TABLETS TWICE DAILY. 120 tablet 0    No current facility-administered medications for this visit.    PHYSICAL EXAMINATION: ECOG PERFORMANCE STATUS: 1 - Symptomatic but completely ambulatory  Vitals:   06/29/20 1100  BP: (!) 147/73  Pulse: 86  Resp: 17  Temp: 97.7 F (36.5 C)  SpO2: 99%   Filed Weights   06/29/20 1100  Weight: 134 lb (60.8 kg)    BREAST: No palpable masses or nodules in either right or left breasts. No palpable axillary supraclavicular or infraclavicular adenopathy no breast tenderness or nipple discharge. (exam performed in the presence of a chaperone)  LABORATORY DATA:  I have reviewed the data as listed CMP Latest Ref Rng & Units 06/15/2020 01/28/2020 10/02/2019  Glucose 65 - 99 mg/dL 96 101(H) 84  BUN 7 - 25 mg/dL _0 Creatinine 0.60 - 0.93 mg/dL 0.85 1.09(H) 0.99(H)  Sodium 135 - 146 mmol/L 138 140 141  Potassium 3.5 - 5.3 mmol/L 3.9 4.6 4.3  Chloride 98 - 110 mmol/L 103 103 107  CO2 20 - 32 mmol/L _1 Calcium 8.6 - 10.4 mg/dL 9.5 10.0 9.2  Total Protein 6.1 - 8.1 g/dL 6.4 6.6 6.2  Total Bilirubin 0.2 - 1.2 mg/dL 0.3 0.6 0.5  Alkaline Phos 38 - 126 U/L - - -  AST 10 - 35 U/L _2 ALT 6 - 29 U/L _3 Lab Results  Component Value Date   WBC 6.7 06/15/2020   HGB 12.6 06/15/2020   HCT 37.3 06/15/2020   MCV 88.6 06/15/2020   PLT 177 06/15/2020   NEUTROABS 4,301 06/15/2020    ASSESSMENT & PLAN:  Malignant neoplasm of upper-outer quadrant of right breast in female, estrogen receptor positive ([place]) 06/05/2018: Bilateral mastectomies: Right mastectomy: Invasive lobular carcinoma, multifocal, 1.2 cm is the largest tumor, grade 1, margins negative, 0/1 lymph node, ER 100%, PR 70%, HER-2 -1+, Ki-67 5% T1c N0 stage Ia Left mastectomy: Benign Oncotype DX: Score 11, ROR 3%  Current treatment: Tamoxifen started January 2020 patient is able to tolerate 15 mg only Tamoxifen toxicities: Severe hot flashes at 20 mg but she is able to tolerate 15 mg.  I sent a new  prescription for 1/2 tablets of the 10 mg dosage of tamoxifen. Frequent UTIs: I discussed with her it could be because of vaginal dryness.  If she would benefit from topical estrogen creams, it is absolutely fine to use it because tamoxifen will still be effective with topical estrogen cream.  She will discuss this with her gynecologist.  Breast cancer surveillance: 1.  Chest wall examination 06/29/2020: No palpable lumps or nodules. patient had previous bilateral mastectomies. 2.  No role of imaging studies Patient son was diagnosed with colon cancer and completed adjuvant chemotherapy. Patient's husband and father passed away in 07-20-2023 of last year and therefore this is a very hard time of year for her. Return to clinic in  June 2023 for follow-ups.  This way we will alternate her visits with her gynecologist.    No orders of the defined types were placed in this encounter.  The patient has a good understanding of the overall plan. she agrees with it. she will call with any problems that may develop before the next visit here.  Total time spent: 20 mins including face to face time and time spent for planning, charting and coordination of care  Nicholas Lose, MD 06/29/2020  I, Cloyde Reams Dorshimer, am acting as scribe for Dr. Nicholas Lose.  I have reviewed the above documentation for accuracy and completeness, and I agree with the above.

## 2020-06-29 ENCOUNTER — Inpatient Hospital Stay: Payer: Medicare PPO | Attending: Hematology and Oncology | Admitting: Hematology and Oncology

## 2020-06-29 ENCOUNTER — Other Ambulatory Visit: Payer: Self-pay

## 2020-06-29 ENCOUNTER — Telehealth: Payer: Self-pay | Admitting: Hematology and Oncology

## 2020-06-29 DIAGNOSIS — Z800 Family history of malignant neoplasm of digestive organs: Secondary | ICD-10-CM | POA: Insufficient documentation

## 2020-06-29 DIAGNOSIS — Z9013 Acquired absence of bilateral breasts and nipples: Secondary | ICD-10-CM | POA: Diagnosis not present

## 2020-06-29 DIAGNOSIS — C50411 Malignant neoplasm of upper-outer quadrant of right female breast: Secondary | ICD-10-CM | POA: Diagnosis not present

## 2020-06-29 DIAGNOSIS — Z170 Estrogen receptor positive status [ER+]: Secondary | ICD-10-CM | POA: Diagnosis not present

## 2020-06-29 MED ORDER — TAMOXIFEN CITRATE 10 MG PO TABS
10.0000 mg | ORAL_TABLET | Freq: Two times a day (BID) | ORAL | 5 refills | Status: DC
Start: 2020-06-29 — End: 2021-09-20

## 2020-06-29 MED ORDER — VITAMIN D3 50 MCG (2000 UT) PO CAPS
2000.0000 [IU] | ORAL_CAPSULE | Freq: Two times a day (BID) | ORAL | Status: DC
Start: 2020-06-29 — End: 2022-01-09

## 2020-06-29 MED ORDER — TAMOXIFEN CITRATE 10 MG PO TABS
10.0000 mg | ORAL_TABLET | Freq: Two times a day (BID) | ORAL | 0 refills | Status: DC
Start: 2020-06-29 — End: 2020-06-29

## 2020-06-29 NOTE — Assessment & Plan Note (Signed)
06/05/2018: Bilateral mastectomies: Right mastectomy: Invasive lobular carcinoma, multifocal, 1.2 cm is the largest tumor, grade 1, margins negative, 0/1 lymph node, ER 100%, PR 70%, HER-2 -1+, Ki-67 5% T1c N0 stage Ia Left mastectomy: Benign Oncotype DX: Score 11, ROR 3%  Current treatment: Tamoxifen started January 2020 patient is able to tolerate 15 mg only Tamoxifen toxicities: Severe hot flashes at 20 mg but she is able to tolerate 15 mg.  I sent a new prescription for 1/2 tablets of the 10 mg dosage of tamoxifen.  Breast cancer surveillance: 1.  Chest wall examination 06/29/2020: No palpable lumps or nodules. patient had previous bilateral mastectomies. 2.  No role of imaging studies Patient son was diagnosed with colon cancer and is undergoing adjuvant chemotherapy. Patient's husband has had health issues.  Patient's father has multiple myeloma at 51.  Return to clinic in 1 year for follow-up

## 2020-06-29 NOTE — Telephone Encounter (Signed)
Scheduled appts per 12/15 los. Gave pt a print out of AVS.

## 2020-06-30 ENCOUNTER — Other Ambulatory Visit: Payer: Self-pay | Admitting: Internal Medicine

## 2020-06-30 NOTE — Telephone Encounter (Signed)
Patient is requesting refill on medication "Lorazepam 0.5mg " Medication hasn't been filled by you. Medication pend and sent to PCP Virgie Dad, MD . Please Advise.

## 2020-07-12 DIAGNOSIS — N390 Urinary tract infection, site not specified: Secondary | ICD-10-CM | POA: Diagnosis not present

## 2020-07-12 DIAGNOSIS — R309 Painful micturition, unspecified: Secondary | ICD-10-CM | POA: Diagnosis not present

## 2020-07-26 ENCOUNTER — Other Ambulatory Visit: Payer: Self-pay | Admitting: Internal Medicine

## 2020-07-27 ENCOUNTER — Other Ambulatory Visit: Payer: Self-pay

## 2020-07-27 ENCOUNTER — Non-Acute Institutional Stay: Payer: Medicare PPO | Admitting: Internal Medicine

## 2020-07-27 ENCOUNTER — Encounter: Payer: Self-pay | Admitting: Internal Medicine

## 2020-07-27 VITALS — BP 136/86 | HR 97 | Temp 96.6°F | Ht 64.0 in | Wt 136.5 lb

## 2020-07-27 DIAGNOSIS — M545 Low back pain, unspecified: Secondary | ICD-10-CM

## 2020-07-27 DIAGNOSIS — C50911 Malignant neoplasm of unspecified site of right female breast: Secondary | ICD-10-CM

## 2020-07-27 DIAGNOSIS — F331 Major depressive disorder, recurrent, moderate: Secondary | ICD-10-CM | POA: Diagnosis not present

## 2020-07-27 DIAGNOSIS — G8929 Other chronic pain: Secondary | ICD-10-CM

## 2020-07-27 DIAGNOSIS — K219 Gastro-esophageal reflux disease without esophagitis: Secondary | ICD-10-CM | POA: Diagnosis not present

## 2020-07-27 DIAGNOSIS — N390 Urinary tract infection, site not specified: Secondary | ICD-10-CM

## 2020-07-27 DIAGNOSIS — E559 Vitamin D deficiency, unspecified: Secondary | ICD-10-CM

## 2020-07-27 DIAGNOSIS — M810 Age-related osteoporosis without current pathological fracture: Secondary | ICD-10-CM | POA: Diagnosis not present

## 2020-07-27 DIAGNOSIS — I10 Essential (primary) hypertension: Secondary | ICD-10-CM

## 2020-07-27 DIAGNOSIS — Q181 Preauricular sinus and cyst: Secondary | ICD-10-CM | POA: Diagnosis not present

## 2020-07-27 DIAGNOSIS — F419 Anxiety disorder, unspecified: Secondary | ICD-10-CM

## 2020-07-27 NOTE — Progress Notes (Signed)
Location:      Place of Service:     Provider:   Code Status:  Goals of Care:  Advanced Directives 06/15/2020  Does Patient Have a Medical Advance Directive? Yes  Type of Advance Directive [place]  Does patient want to make changes to medical advance directive? No - Patient declined  Copy of [place] in Chart? Yes - validated most recent copy scanned in chart (See row information)  Would patient like information on creating a medical advance directive? -  Pre-existing out of facility DNR order (yellow form or pink MOST form) -     Chief Complaint  Patient presents with  . Medical Management of Chronic Issues    HPI: Patient is a 70 y.o. female seen today for medical management of chronic diseases.    Patient active issues  Recurrent UTI Patient is struggling with UTI for past few months this is her third course of antibiotics by her GYN Dr. Helane Rima..  She has referred her to urology now.  History of bilateral mastectomy for right breast invasive lobular cancer  Follows with Dr. 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  Has found a new friend and her son is in remission  Patient is very independent still drives and does not use cane or walker  Past Medical History:  Diagnosis Date  . Anxiety   . Arthritis    "back" (06/05/2018)  . Breast cancer, right breast ([place]) 03/2018  . Chronic lower back pain    "if I don't do yoga" (06/05/2018)  . Depression   . Family history of breast  cancer   . GERD (gastroesophageal reflux disease)   . 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 History of hiatal hernia   . Macular degeneration    Per [facility] New Patient Packet   . Nephritis    "as a child" (06/05/2018)  . Osteoporosis   . SCC (squamous cell carcinoma)    "under my nose" (06/05/2018)  . Strabismus    Per [place] new patient packet   . UTI (urinary tract infection) 8/15  . Wears glasses     Past Surgical History:  Procedure Laterality Date  . BREAST BIOPSY Right 04/2018  . CATARACT EXTRACTION W/ INTRAOCULAR LENS  IMPLANT, BILATERAL Bilateral   . CESAREAN SECTION  1977  . COLONOSCOPY    . CYST REMOVAL NECK Left 03/03/2014   Procedure: LEFT NECK CYST EXCISION;  Surgeon: Pedro Earls, MD;  Location: [place];  Service: General;  Laterality: Left;  . DILATION AND CURETTAGE OF UTERUS    . EYE SURGERY     Strabismus  . MASTECTOMY Left 06/05/2018  . MASTECTOMY COMPLETE / SIMPLE W/ SENTINEL NODE BIOPSY Right 06/05/2018   AND BLUE DYE INJECTION RIGHT BREAST  . MASTECTOMY W/ SENTINEL NODE BIOPSY Bilateral 06/05/2018   Procedure: BILATERAL MASTECTOMIES WITH RIGHT SENTINEL LYMPH NODE BIOPSY AND BLUE DYE INJECTION RIGHT BREAST;  Surgeon: Donnie Mesa, MD;  Location: [place];  Service: General;  Laterality: Bilateral;  . SQUAMOUS CELL CARCINOMA EXCISION     "under my nose"  . STRABISMUS SURGERY Bilateral 05/02/2018   Procedure: BILATERAL REPAIR STRABISMUS;  Surgeon: Lamonte Sakai, MD;  Location: [place];  Service: Ophthalmology;  Laterality: Bilateral;  . TONSILLECTOMY AND ADENOIDECTOMY    . UPPER GI ENDOSCOPY    . WISDOM TOOTH EXTRACTION      Allergies  Allergen Reactions  . Bactrim [Sulfamethoxazole-Trimethoprim] Anaphylaxis, Hives and Rash    Outpatient Encounter Medications as of 07/27/2020  Medication Sig  . CALCIUM-MAGNESIUM PO Take 1 tablet by mouth daily.   . Cholecalciferol (VITAMIN D3) 50 MCG (2000 UT) capsule Take 1 capsule (2,000  Units total) by mouth in the morning and at bedtime.  Marland Kitchen desvenlafaxine (PRISTIQ) 50 MG 24 hr tablet Take 1 tablet (50 mg total) by mouth daily.  . Digestive Enzymes (DIGESTIVE ENZYME PO) Take 1 capsule by mouth 3 (three) times daily.   . Hypromellose (ARTIFICIAL TEARS OP) Place 1 drop into both eyes 2 (two) times daily.   Marland Kitchen Ketotifen Fumarate (ALAWAY OP) Apply to eye. As needed for allergies  . LORazepam (ATIVAN) 0.5 MG tablet TAKE ONE TABLET AT BEDTIME, MAY TAKE AND EXTRA 1/2 TABLET AS NEEDED.  . Magnesium Gluconate (MAG-G) 500 (27 Mg) MG TABS Take 500 mg by mouth daily.   . melatonin 3 MG TABS tablet Take 3 mg by mouth at bedtime as needed.  . meloxicam (MOBIC) 15 MG tablet TAKE 1 TABLET ONCE DAILY.  . Multiple Vitamins-Minerals (PRESERVISION AREDS) CAPS Take 1 capsule by mouth 2 (two) times daily.   . Nutritional Supplements (CYTO-REDOXIN PO) Take 2 oz by mouth in the morning and at bedtime.  . Omega-3 Fatty Acids (OMEGA 3 PO) Take 1 capsule by mouth daily.   . psyllium (METAMUCIL) 58.6 % packet Take 1 packet by mouth daily.  . sodium chloride (OCEAN) 0.65 % SOLN nasal spray Place 1 spray into both nostrils 2 (two) times daily as needed for congestion.  . tamoxifen (NOLVADEX) 10 MG tablet Take 1 tablet (10 mg total) by mouth 2 (two) times daily.   No facility-administered encounter medications on file as of 07/27/2020.    Review of Systems:  Review of Systems  Review of Systems  Constitutional: Negative for activity change, appetite change, chills, diaphoresis, fatigue and fever.  HENT: Negative for mouth sores, postnasal drip, rhinorrhea, sinus pain and sore throat.   Respiratory: Negative for apnea, cough, chest tightness, shortness of breath and wheezing.   Cardiovascular: Negative for chest pain, palpitations and leg swelling.  Gastrointestinal: Negative for abdominal distention, abdominal pain, constipation, diarrhea, nausea and vomiting.  Genitourinary: Positive for Frequency   Musculoskeletal: Negative for arthralgias, joint swelling and myalgias.  Skin: Negative for rash.  Neurological: Negative for dizziness, syncope, weakness, light-headedness and numbness.  Psychiatric/Behavioral: Negative for behavioral problems, confusion and sleep disturbance.    1  Health Maintenance  Topic Date Due  . Hepatitis C Screening  Never done  . COVID-19 Vaccine (4 - Booster for Moderna series) 11/27/2020  . TETANUS/TDAP  11/03/2025  . COLONOSCOPY (Pts 45-10yrs Insurance coverage will need to be confirmed)  12/22/2029  . INFLUENZA VACCINE  Completed  . DEXA SCAN  Completed  . PNA vac Low Risk Adult  Completed  . MAMMOGRAM  Discontinued    Physical Exam: Vitals:   07/27/20 1508  BP: 136/86  Pulse: 97  Temp: (!) 96.6 F (35.9 C)  SpO2: 100%  Weight: 136  lb 8 oz (61.9 kg)  Height: 5\' 4"  (1.626 m)   Body mass index is 23.43 kg/m. Physical Exam  Constitutional: Oriented to person, place, and time. Well-developed and well-nourished.  HENT:  Head: Normocephalic.  Mouth/Throat: Oropharynx is clear and moist.  TM Good with no wax Eyes: Pupils are equal, round, and reactive to light.  Neck: Neck supple.  Cardiovascular: Normal rate and normal heart sounds.  No murmur heard. Pulmonary/Chest: Effort normal and breath sounds normal. No respiratory distress. No wheezes. She has no rales.  Abdominal: Soft. Bowel sounds are normal. No distension. There is no tenderness. There is no rebound.  Musculoskeletal: No edema.  Lymphadenopathy: none Neurological: Alert and oriented to person, place, and time.  Skin: Skin is warm and dry.  Psychiatric: Normal mood and affect. Behavior is normal. Thought content normal.    Labs reviewed: Basic Metabolic Panel: Recent Labs    10/02/19 0831 01/28/20 0000 06/15/20 1138  NA 141 140 138  K 4.3 4.6 3.9  CL 107 103 103  CO2 27 28 31   GLUCOSE 84 101* 96  BUN 18 19 15   CREATININE 0.99* 1.09* 0.85  CALCIUM 9.2 10.0 9.5    Liver Function Tests: Recent Labs    10/02/19 0831 01/28/20 0000 06/15/20 1138  AST 15 17 15   ALT 11 15 14   BILITOT 0.5 0.6 0.3  PROT 6.2 6.6 6.4   No results for input(s): LIPASE, AMYLASE in the last 8760 hours. No results for input(s): AMMONIA in the last 8760 hours. CBC: Recent Labs    10/02/19 0831 01/28/20 0000 06/15/20 1138  WBC 5.0 6.2 6.7  NEUTROABS 2,250 2,579 4,301  HGB 12.8 14.3 12.6  HCT 38.9 43.1 37.3  MCV 89.8 90.0 88.6  PLT 209 229 177   Lipid Panel: Recent Labs    01/28/20 0000  CHOL 195  HDL 74  LDLCALC 98  TRIG 135  CHOLHDL 2.6   No results found for: HGBA1C  Procedures since last visit: No results found.  Assessment/Plan 1. Recurrent UTI Following with Dr Helane Rima GYN She is on third round of Antibiotics Also started on Estrogen cream Has appointment with urology   2. Essential hypertension Is going to monitor at home again  Not on any Meds -   CBC with Differential/Platelet; Future - COMPLETE METABOLIC PANEL WITH GFR; Future - TSH; Future - Lipid panel; Future  3. Osteoporosis without current pathological fracture, unspecified osteoporosis type  Recent DEXA showed improvement as she is on Tamoxifen  4. Invasive lobular carcinoma of breast, stage 1, right (HCC) Continue Tamoxifen for now  5. Vitamin D deficiency Continue supplement  6. Chronic bilateral low back pain without sciatica Will try again to taper Meloxicam Will try tylenol PRN  7. Ear cysts Dr Georgette Dover said no Follow up unless symptoms  8. Moderate episode of recurrent major depressive disorder ([place]) Doing well on Pristiq   9. Gastroesophageal reflux disease without esophagitis Did not do Esophageal manometry Symptoms controlled on Pepcid  10. Anxiety Uses ativan PRN at night  Labs/tests ordered:  * No order type specified * Next appt:  01/19/2021

## 2020-08-08 ENCOUNTER — Other Ambulatory Visit: Payer: Self-pay | Admitting: Internal Medicine

## 2020-08-08 NOTE — Telephone Encounter (Signed)
Patient has request refill on medication "Lorazepam 0.5 mg". Patient last refill was 06/30/2020. Patient medication pend and sent to PCP Virgie Dad, MD . Please Advise.

## 2020-08-09 ENCOUNTER — Encounter: Payer: Medicare PPO | Admitting: Nurse Practitioner

## 2020-08-09 ENCOUNTER — Telehealth: Payer: Self-pay

## 2020-08-09 ENCOUNTER — Other Ambulatory Visit: Payer: Self-pay

## 2020-08-09 NOTE — Telephone Encounter (Addendum)
Attempted calling patient 3 times to start AWV. Left message on patient's vm.  1st attempt  3:13 pm- Patient picked up phone, but never said anything (heard someone talking in the background  2nd attempt  3:18 pm- Left patient a message that I was trying to reach her to start visit and that I would be calling from a [place] or [place] number  3rd and final attempt  3:35pm-called and left message on vm that this was my final attempt to reach her and that she would need to call office to reschedule with office call back number.

## 2020-08-09 NOTE — Progress Notes (Unsigned)
This service is provided via telemedicine  No vital signs collected/recorded due to the encounter was a telemedicine visit.   Location of patient (ex: home, work):  Home  Patient consents to a telephone visit:  Yes, see encounter dated 08/09/2020  Location of the provider (ex: office, home):  [place]  Name of any referring provider:  Veleta Miners, MD  Names of all persons participating in the telemedicine service and their role in the encounter:  Sherrie Mustache, Nurse Practitioner, Carroll Kinds, CMA, and patient.   Time spent on call:  15 minutes with medical assistant

## 2020-08-26 DIAGNOSIS — N3020 Other chronic cystitis without hematuria: Secondary | ICD-10-CM | POA: Diagnosis not present

## 2020-08-26 DIAGNOSIS — R3982 Chronic bladder pain: Secondary | ICD-10-CM | POA: Diagnosis not present

## 2020-08-29 ENCOUNTER — Other Ambulatory Visit: Payer: Self-pay | Admitting: Internal Medicine

## 2020-08-29 NOTE — Telephone Encounter (Signed)
Patient has request refill on medication 'Meloxicam 15mg ". Last refill was 07/26/2020 with 30 tablets and no refills. Is this medication long term? Medication pend and sent to PCP Virgie Dad, MD for approval. Please Advise.

## 2020-09-16 ENCOUNTER — Other Ambulatory Visit: Payer: Self-pay

## 2020-09-16 ENCOUNTER — Ambulatory Visit: Payer: Medicare PPO | Admitting: Orthopedic Surgery

## 2020-09-16 ENCOUNTER — Encounter: Payer: Self-pay | Admitting: Orthopedic Surgery

## 2020-09-16 VITALS — BP 150/90 | HR 91 | Temp 97.3°F | Resp 18 | Ht 64.5 in | Wt 137.8 lb

## 2020-09-16 DIAGNOSIS — N390 Urinary tract infection, site not specified: Secondary | ICD-10-CM | POA: Diagnosis not present

## 2020-09-16 DIAGNOSIS — N898 Other specified noninflammatory disorders of vagina: Secondary | ICD-10-CM

## 2020-09-16 DIAGNOSIS — R350 Frequency of micturition: Secondary | ICD-10-CM | POA: Diagnosis not present

## 2020-09-16 LAB — POCT URINALYSIS DIPSTICK
Bilirubin, UA: NEGATIVE
Glucose, UA: NEGATIVE
Ketones, UA: NEGATIVE
Nitrite, UA: NEGATIVE
Protein, UA: POSITIVE — AB
Spec Grav, UA: 1.01 (ref 1.010–1.025)
Urobilinogen, UA: 0.2 E.U./dL
pH, UA: 7 (ref 5.0–8.0)

## 2020-09-16 MED ORDER — PHENAZOPYRIDINE HCL 100 MG PO TABS: 100.0000 mg | ORAL_TABLET | Freq: Three times a day (TID) | ORAL | 0 refills | Status: DC | PRN

## 2020-09-16 NOTE — Progress Notes (Signed)
Careteam: Patient Care Team: Virgie Dad, MD as PCP - General (Internal Medicine) Ronnette Juniper, MD as Consulting Physician (Gastroenterology) Phylliss Bob, MD as Consulting Physician (Orthopedic Surgery) Nicholas Lose, MD as Consulting Physician (Hematology and Oncology) Dian Queen, MD as Consulting Physician (Obstetrics and Gynecology) Marygrace Drought, MD as Consulting Physician (Ophthalmology) Leta Baptist, MD as Consulting Physician (Otolaryngology)  Seen by: Windell Moulding, AGNP-C  PLACE OF SERVICE:  [place] Directive information Does Patient Have a Medical Advance Directive?: Yes, Type of Advance Directive: [place], Does patient want to make changes to medical advance directive?: No - Patient declined  Allergies  Allergen Reactions  . Bactrim [Sulfamethoxazole-Trimethoprim] Anaphylaxis, Hives and Rash    Chief Complaint  Patient presents with  . Acute Visit    Possible UTI     HPI: Patient is a 70 y.o. female seen today for acute visit for vaginal itching, urinary burning and frequency.  Followed by Dr. Claudia Desanctis at [facility], last seen 02/11. She was advised to contact them or PCP if UTI symptoms begin. Cannot tell if burning is from urinary or vaginal area. 02/27 she has some mild constipation and used a suppository. 03/01 she began to have increased vaginal itching. 03/03 she began to feel fatigued. Reports she is drinking 3-4 bottles of water a day. Remains sexually active with same partner,  voids before and after sex. Wears cotton panties. Continues to take D-Mannos, probiotic and Estrace cream daily. Also started taking Vagisil this week with no relief. History of three UTIs since September 2021. She has completed Amoxicillin, keflex and cipro for past UTIs.      Last seen 02/11 by [facility]. Dr. Claudia Desanctis.   Review of Systems:  Review of Systems  Constitutional: Positive for malaise/fatigue. Negative for fever.   Respiratory: Negative for cough, shortness of breath and wheezing.   Cardiovascular: Negative for chest pain and leg swelling.  Genitourinary: Positive for dysuria and frequency. Negative for hematuria.       Vaginal itching/burning  Psychiatric/Behavioral: Negative for depression. The patient is not nervous/anxious.     Past Medical History:  Diagnosis Date  . Anxiety   . Arthritis    "back" (06/05/2018)  . Breast cancer, right breast ([place]) 03/2018  . Chronic lower back pain    "if I don't do yoga" (06/05/2018)  . Depression   . Family history of breast cancer   . GERD (gastroesophageal reflux disease)   . 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 History of hiatal hernia   . Macular degeneration    Per [facility] New Patient Packet   . Nephritis    "as a child" (06/05/2018)  . Osteoporosis   . SCC (squamous cell carcinoma)    "under my nose" (06/05/2018)  . Strabismus    Per [place] new patient packet   . UTI (urinary tract infection) 8/15  . Wears glasses    Past Surgical History:  Procedure Laterality Date  . BREAST BIOPSY Right 04/2018  . CATARACT EXTRACTION W/ INTRAOCULAR LENS  IMPLANT, BILATERAL Bilateral   . CESAREAN SECTION  1977  . COLONOSCOPY    . CYST REMOVAL NECK Left 03/03/2014   Procedure: LEFT NECK CYST EXCISION;  Surgeon: Pedro Earls, MD;  Location: [place];  Service: General;  Laterality: Left;  . DILATION AND CURETTAGE OF UTERUS    . EYE SURGERY     Strabismus  . MASTECTOMY Left 06/05/2018  . MASTECTOMY  COMPLETE / SIMPLE W/ SENTINEL NODE BIOPSY Right 06/05/2018   AND BLUE DYE INJECTION RIGHT BREAST  . MASTECTOMY W/ SENTINEL NODE BIOPSY Bilateral 06/05/2018   Procedure: BILATERAL MASTECTOMIES WITH RIGHT SENTINEL LYMPH NODE BIOPSY AND BLUE DYE INJECTION RIGHT BREAST;  Surgeon: Donnie Mesa, MD;  Location: [place];  Service: General;  Laterality: Bilateral;  . SQUAMOUS CELL CARCINOMA EXCISION     "under my nose"  . STRABISMUS SURGERY Bilateral  05/02/2018   Procedure: BILATERAL REPAIR STRABISMUS;  Surgeon: Lamonte Sakai, MD;  Location: [place];  Service: Ophthalmology;  Laterality: Bilateral;  . TONSILLECTOMY AND ADENOIDECTOMY    . UPPER GI ENDOSCOPY    . WISDOM TOOTH EXTRACTION     Social History:   reports that she quit smoking about 44 years ago. Her smoking use included cigarettes. She has a 0.50 pack-year smoking history. She has never used smokeless tobacco. She reports previous alcohol use. She reports that she does not use drugs.  Family History  Problem Relation Age of Onset  . Breast cancer Mother 73  . Hypertension Father   . Mental illness Sister   . Osteoporosis Sister   . Seizures Sister   . Suicidality Sister   . Cancer Maternal Grandfather        blood cancer?  . Breast cancer Cousin        dx under 50  . Cancer Paternal Aunt        type unk  . Cancer Other        type unk  . Cancer Son     Medications: Patient's Medications  New Prescriptions   No medications on file  Previous Medications   BORIC ACID VAGINAL 600 MG SUPP    Place 1 capsule vaginally as needed.   CALCIUM-MAGNESIUM PO    Take 1 tablet by mouth daily.    CHOLECALCIFEROL (VITAMIN D3) 50 MCG (2000 UT) CAPSULE    Take 1 capsule (2,000 Units total) by mouth in the morning and at bedtime.   DESVENLAFAXINE (PRISTIQ) 50 MG 24 HR TABLET    Take 1 tablet (50 mg total) by mouth daily.   DIGESTIVE ENZYMES (DIGESTIVE ENZYME PO)    Take 1 capsule by mouth 3 (three) times daily.    ESTRADIOL (ESTRACE) 0.1 MG/GM VAGINAL CREAM    Place 1 Applicatorful vaginally 3 (three) times a week.   HYPROMELLOSE (ARTIFICIAL TEARS OP)    Place 1 drop into both eyes daily in the afternoon.   KETOTIFEN FUMARATE (ALAWAY OP)    Apply to eye. As needed for allergies   LORAZEPAM (ATIVAN) 0.5 MG TABLET    TAKE ONE TABLET AT BEDTIME, MAY TAKE AND EXTRA 1/2 TABLET AS NEEDED.   MAGNESIUM GLUCONATE 500 (27 MG) MG TABS    Take 500 mg by mouth daily.     MELATONIN 3 MG TABS TABLET    Take 3 mg by mouth at bedtime as needed.   MELOXICAM (MOBIC) 15 MG TABLET    TAKE 1 TABLET ONCE DAILY.   MULTIPLE VITAMINS-MINERALS (PRESERVISION AREDS) CAPS    Take 1 capsule by mouth 2 (two) times daily.    NON FORMULARY    Take 1 capsule by mouth 2 (two) times daily. Bio-Complete 3   NUTRITIONAL SUPPLEMENTS (CYTO-REDOXIN PO)    Take 2 oz by mouth in the morning and at bedtime.   OMEGA-3 FATTY ACIDS (OMEGA 3 PO)    Take 1 capsule by mouth daily.    PRAMOXINE HCL (VAGISIL  ANTI-ITCH MEDICATED EX)    Apply 1 application topically as needed.   PROBIOTIC PRODUCT (PROBIOTIC PO)    Take 1 capsule by mouth 2 (two) times daily. D-Mannose 1300 mg with Cranberry Extract   PSYLLIUM (METAMUCIL) 58.6 % PACKET    Take 1 packet by mouth daily.   SODIUM CHLORIDE (OCEAN) 0.65 % SOLN NASAL SPRAY    Place 1 spray into both nostrils daily as needed for congestion.   TAMOXIFEN (NOLVADEX) 10 MG TABLET    Take 1 tablet (10 mg total) by mouth 2 (two) times daily.  Modified Medications   No medications on file  Discontinued Medications   No medications on file    Physical Exam:  Vitals:   09/16/20 0939  BP: (!) 150/90  Pulse: 91  Resp: 18  Temp: (!) 97.3 F (36.3 C)  TempSrc: Temporal  SpO2: 96%  Weight: 137 lb 12.8 oz (62.5 kg)  Height: 5' 4.5" (1.638 m)   Body mass index is 23.29 kg/m. Wt Readings from Last 3 Encounters:  09/16/20 137 lb 12.8 oz (62.5 kg)  07/27/20 136 lb 8 oz (61.9 kg)  06/29/20 134 lb (60.8 kg)    Physical Exam Vitals reviewed.  Constitutional:      General: She is not in acute distress. Cardiovascular:     Rate and Rhythm: Normal rate and regular rhythm.     Pulses: Normal pulses.     Heart sounds: Normal heart sounds. No murmur heard.   Genitourinary:    Comments: Urine yellow, cloudy Skin:    General: Skin is warm and dry.  Neurological:     Mental Status: She is alert.  Psychiatric:        Mood and Affect: Mood normal.         Behavior: Behavior normal.     Labs reviewed: Basic Metabolic Panel: Recent Labs    10/02/19 0831 01/28/20 0000 06/15/20 1138  NA 141 140 138  K 4.3 4.6 3.9  CL 107 103 103  CO2 27 28 31   GLUCOSE 84 101* 96  BUN 18 19 15   CREATININE 0.99* 1.09* 0.85  CALCIUM 9.2 10.0 9.5   Liver Function Tests: Recent Labs    10/02/19 0831 01/28/20 0000 06/15/20 1138  AST 15 17 15   ALT 11 15 14   BILITOT 0.5 0.6 0.3  PROT 6.2 6.6 6.4   No results for input(s): LIPASE, AMYLASE in the last 8760 hours. No results for input(s): AMMONIA in the last 8760 hours. CBC: Recent Labs    10/02/19 0831 01/28/20 0000 06/15/20 1138  WBC 5.0 6.2 6.7  NEUTROABS 2,250 2,579 4,301  HGB 12.8 14.3 12.6  HCT 38.9 43.1 37.3  MCV 89.8 90.0 88.6  PLT 209 229 177   Lipid Panel: Recent Labs    01/28/20 0000  CHOL 195  HDL 74  LDLCALC 98  TRIG 135  CHOLHDL 2.6   TSH: No results for input(s): TSH in the last 8760 hours. A1C: No results found for: HGBA1C   Assessment/Plan 1. Urinary frequency - history of 3 UTI since 03/2020 - given amoxicillin, keflex and cipro in past - cont cranberry supplement - encourage hydration - advised to take pyridium for urinary burning- she is having trouble telling if from vagina or urinary tract - POC Urinalysis Dipstick - blood and protein present, leukocytes 3+ large - phenazopyridine (PYRIDIUM) 100 MG tablet; Take 1 tablet (100 mg total) by mouth 3 (three) times daily as needed for pain.  Dispense: 10 tablet;  Refill: 0 - Culture, Urine  2. Recurrent UTI - same as above -followed by [facility]- patient states they discussed long-term antibiotic  3. Vaginal itching -complains of itching and burning - suspect due to yeast - cont estrace and probiotic - start monistat 3- day treatment   I provided 24 minutes of face-to-face time during this encounter.   Next appt: none Amy [place], [facility] [phone]

## 2020-09-16 NOTE — Patient Instructions (Signed)
Please purchase OTC Monistat and take for three days.   Urinary Tract Infection, Adult A urinary tract infection (UTI) is an infection of any part of the urinary tract. The urinary tract includes:  The kidneys.  The ureters.  The bladder.  The urethra. These organs make, store, and get rid of pee (urine) in the body. What are the causes? This infection is caused by germs (bacteria) in your genital area. These germs grow and cause swelling (inflammation) of your urinary tract. What increases the risk? The following factors may make you more likely to develop this condition:  Using a small, thin tube (catheter) to drain pee.  Not being able to control when you pee or poop (incontinence).  Being female. If you are female, these things can increase the risk: ? Using these methods to prevent pregnancy:  A medicine that kills sperm (spermicide).  A device that blocks sperm (diaphragm). ? Having low levels of a female hormone (estrogen). ? Being pregnant. You are more likely to develop this condition if:  You have genes that add to your risk.  You are sexually active.  You take antibiotic medicines.  You have trouble peeing because of: ? A prostate that is bigger than normal, if you are female. ? A blockage in the part of your body that drains pee from the bladder. ? A kidney stone. ? A nerve condition that affects your bladder. ? Not getting enough to drink. ? Not peeing often enough.  You have other conditions, such as: ? Diabetes. ? A weak disease-fighting system (immune system). ? Sickle cell disease. ? Gout. ? Injury of the spine. What are the signs or symptoms? Symptoms of this condition include:  Needing to pee right away.  Peeing small amounts often.  Pain or burning when peeing.  Blood in the pee.  Pee that smells bad or not like normal.  Trouble peeing.  Pee that is cloudy.  Fluid coming from the vagina, if you are female.  Pain in the belly or  lower back. Other symptoms include:  Vomiting.  Not feeling hungry.  Feeling mixed up (confused). This may be the first symptom in older adults.  Being tired and grouchy (irritable).  A fever.  Watery poop (diarrhea). How is this treated?  Taking antibiotic medicine.  Taking other medicines.  Drinking enough water. In some cases, you may need to see a specialist. Follow these instructions at home: Medicines  Take over-the-counter and prescription medicines only as told by your doctor.  If you were prescribed an antibiotic medicine, take it as told by your doctor. Do not stop taking it even if you start to feel better. General instructions  Make sure you: ? Pee until your bladder is empty. ? Do not hold pee for a long time. ? Empty your bladder after sex. ? Wipe from front to back after peeing or pooping if you are a female. Use each tissue one time when you wipe.  Drink enough fluid to keep your pee pale yellow.  Keep all follow-up visits.   Contact a doctor if:  You do not get better after 1-2 days.  Your symptoms go away and then come back. Get help right away if:  You have very bad back pain.  You have very bad pain in your lower belly.  You have a fever.  You have chills.  You feeling like you will vomit or you vomit. Summary  A urinary tract infection (UTI) is an infection of any  part of the urinary tract.  This condition is caused by germs in your genital area.  There are many risk factors for a UTI.  Treatment includes antibiotic medicines.  Drink enough fluid to keep your pee pale yellow. This information is not intended to replace advice given to you by your health care provider. Make sure you discuss any questions you have with your health care provider. Document Revised: 02/12/2020 Document Reviewed: 02/12/2020 Elsevier Patient Education  [place].

## 2020-09-19 ENCOUNTER — Other Ambulatory Visit: Payer: Self-pay | Admitting: Orthopedic Surgery

## 2020-09-19 DIAGNOSIS — N390 Urinary tract infection, site not specified: Secondary | ICD-10-CM

## 2020-09-19 LAB — URINE CULTURE
MICRO NUMBER:: 11613098
SPECIMEN QUALITY:: ADEQUATE

## 2020-09-19 MED ORDER — CIPROFLOXACIN HCL 500 MG PO TABS: 500.0000 mg | ORAL_TABLET | Freq: Two times a day (BID) | ORAL | 0 refills | Status: DC

## 2020-09-29 ENCOUNTER — Other Ambulatory Visit: Payer: Self-pay | Admitting: Internal Medicine

## 2020-09-29 NOTE — Telephone Encounter (Signed)
Pharmacy requested refill Pended Rx and sent to Dr. Gupta for approval.  

## 2020-10-10 ENCOUNTER — Other Ambulatory Visit: Payer: Self-pay | Admitting: Internal Medicine

## 2020-11-07 ENCOUNTER — Other Ambulatory Visit: Payer: Self-pay | Admitting: Internal Medicine

## 2020-11-25 DIAGNOSIS — N3020 Other chronic cystitis without hematuria: Secondary | ICD-10-CM | POA: Diagnosis not present

## 2020-11-25 DIAGNOSIS — N952 Postmenopausal atrophic vaginitis: Secondary | ICD-10-CM | POA: Diagnosis not present

## 2020-12-26 ENCOUNTER — Other Ambulatory Visit: Payer: Self-pay | Admitting: Orthopedic Surgery

## 2020-12-26 NOTE — Telephone Encounter (Signed)
RX refilled on 11/07/2020, treatment agreement expired, notation made on pending appointment in July to renew

## 2021-01-18 DIAGNOSIS — I10 Essential (primary) hypertension: Secondary | ICD-10-CM | POA: Diagnosis not present

## 2021-01-18 DIAGNOSIS — M810 Age-related osteoporosis without current pathological fracture: Secondary | ICD-10-CM | POA: Diagnosis not present

## 2021-01-19 ENCOUNTER — Other Ambulatory Visit: Payer: Self-pay

## 2021-01-19 DIAGNOSIS — M810 Age-related osteoporosis without current pathological fracture: Secondary | ICD-10-CM

## 2021-01-19 DIAGNOSIS — I10 Essential (primary) hypertension: Secondary | ICD-10-CM

## 2021-01-19 LAB — LIPID PANEL
Cholesterol: 178 mg/dL (ref <200)
HDL: 72 mg/dL (ref >=50)
LDL Cholesterol (Calc): 84 mg/dL (calc)
Non-HDL Cholesterol (Calc): 106 mg/dL (calc) (ref <130)
Total CHOL/HDL Ratio: 2.5 (calc) (ref <5.0)
Triglycerides: 122 mg/dL (ref <150)

## 2021-01-19 LAB — CBC WITH DIFFERENTIAL/PLATELET
Absolute Monocytes: 546 cells/uL (ref 200–950)
Basophils Absolute: 61 cells/uL (ref 0–200)
Basophils Relative: 1.2 %
Eosinophils Absolute: 158 cells/uL (ref 15–500)
Eosinophils Relative: 3.1 %
HCT: 39 % (ref 35.0–45.0)
Hemoglobin: 12.8 g/dL (ref 11.7–15.5)
Lymphs Abs: 2142 cells/uL (ref 850–3900)
MCH: 29.2 pg (ref 27.0–33.0)
MCHC: 32.8 g/dL (ref 32.0–36.0)
MCV: 88.8 fL (ref 80.0–100.0)
MPV: 11.3 fL (ref 7.5–12.5)
Monocytes Relative: 10.7 %
Neutro Abs: 2193 cells/uL (ref 1500–7800)
Neutrophils Relative %: 43 %
Platelets: 212 10*3/uL (ref 140–400)
RBC: 4.39 10*6/uL (ref 3.80–5.10)
RDW: 12.6 % (ref 11.0–15.0)
Total Lymphocyte: 42 %
WBC: 5.1 10*3/uL (ref 3.8–10.8)

## 2021-01-19 LAB — COMPLETE METABOLIC PANEL WITH GFR
AG Ratio: 1.6 (calc) (ref 1.0–2.5)
ALT: 12 U/L (ref 6–29)
AST: 15 U/L (ref 10–35)
Albumin: 3.9 g/dL (ref 3.6–5.1)
Alkaline phosphatase (APISO): 58 U/L (ref 37–153)
BUN/Creatinine Ratio: 19 (calc) (ref 6–22)
BUN: 18 mg/dL (ref 7–25)
CO2: 26 mmol/L (ref 20–32)
Calcium: 9.4 mg/dL (ref 8.6–10.4)
Chloride: 106 mmol/L (ref 98–110)
Creat: 0.96 mg/dL — ABNORMAL HIGH (ref 0.60–0.93)
GFR, Est African American: 68 mL/min/{1.73_m2} (ref >=60)
GFR, Est Non African American: 59 mL/min/{1.73_m2} — ABNORMAL LOW (ref >=60)
Globulin: 2.5 g/dL (calc) (ref 1.9–3.7)
Glucose, Bld: 81 mg/dL (ref 65–99)
Potassium: 4.6 mmol/L (ref 3.5–5.3)
Sodium: 141 mmol/L (ref 135–146)
Total Bilirubin: 0.4 mg/dL (ref 0.2–1.2)
Total Protein: 6.4 g/dL (ref 6.1–8.1)

## 2021-01-19 LAB — TSH: TSH: 5.32 mIU/L — ABNORMAL HIGH (ref 0.40–4.50)

## 2021-01-25 ENCOUNTER — Other Ambulatory Visit: Payer: Self-pay

## 2021-01-25 ENCOUNTER — Encounter: Payer: Medicare PPO | Admitting: Internal Medicine

## 2021-02-08 ENCOUNTER — Other Ambulatory Visit: Payer: Self-pay | Admitting: Orthopedic Surgery

## 2021-02-08 NOTE — Telephone Encounter (Signed)
Patient has request refill on medication "Lorazepam 0.'5mg'$ ". Patient medication last refilled 12/26/2020. Medication pend and sent to Mast Man, NP for approval.

## 2021-02-15 ENCOUNTER — Non-Acute Institutional Stay: Payer: Medicare PPO | Admitting: Internal Medicine

## 2021-02-15 ENCOUNTER — Other Ambulatory Visit: Payer: Self-pay

## 2021-02-15 VITALS — BP 166/120 | HR 86 | Temp 96.6°F | Ht 64.5 in | Wt 137.9 lb

## 2021-02-15 DIAGNOSIS — M810 Age-related osteoporosis without current pathological fracture: Secondary | ICD-10-CM | POA: Diagnosis not present

## 2021-02-15 DIAGNOSIS — F331 Major depressive disorder, recurrent, moderate: Secondary | ICD-10-CM

## 2021-02-15 DIAGNOSIS — M545 Low back pain, unspecified: Secondary | ICD-10-CM | POA: Diagnosis not present

## 2021-02-15 DIAGNOSIS — F419 Anxiety disorder, unspecified: Secondary | ICD-10-CM | POA: Diagnosis not present

## 2021-02-15 DIAGNOSIS — G8929 Other chronic pain: Secondary | ICD-10-CM

## 2021-02-15 DIAGNOSIS — N390 Urinary tract infection, site not specified: Secondary | ICD-10-CM | POA: Diagnosis not present

## 2021-02-15 DIAGNOSIS — E559 Vitamin D deficiency, unspecified: Secondary | ICD-10-CM | POA: Diagnosis not present

## 2021-02-15 DIAGNOSIS — Q181 Preauricular sinus and cyst: Secondary | ICD-10-CM

## 2021-02-15 DIAGNOSIS — C50911 Malignant neoplasm of unspecified site of right female breast: Secondary | ICD-10-CM | POA: Diagnosis not present

## 2021-02-15 DIAGNOSIS — I10 Essential (primary) hypertension: Secondary | ICD-10-CM | POA: Diagnosis not present

## 2021-02-15 DIAGNOSIS — K219 Gastro-esophageal reflux disease without esophagitis: Secondary | ICD-10-CM

## 2021-02-15 DIAGNOSIS — Z7189 Other specified counseling: Secondary | ICD-10-CM

## 2021-02-15 DIAGNOSIS — K5900 Constipation, unspecified: Secondary | ICD-10-CM

## 2021-02-15 NOTE — Progress Notes (Signed)
Location:  [place] of Service:  Clinic (12)  Provider:   Code Status: DNR  Goals of Care:  Advanced Directives 02/15/2021  Does Patient Have a Medical Advance Directive? Yes  Type of Advance Directive [place]  Does patient want to make changes to medical advance directive? No - Patient declined  Copy of [place] in Chart? Yes - validated most recent copy scanned in chart (See row information)  Would patient like information on creating a medical advance directive? -  Pre-existing out of facility DNR order (yellow form or pink MOST form) -     Chief Complaint  Patient presents with   Medical Management of Chronic Issues    Patient here today for follow up.     HPI: Patient is a 70 y.o. female seen today for medical management of chronic diseases.    Active issues Hypertension Today patient's blood pressure was high on the higher side.  Patient little nervous has been working out since this morning but no other changes denies any headache chest pain shortness of breath. Recurrent UTI Was seen by urology and is now on prophylactic Macrodantin as needed Bilateral mastectomy for right breast invasive lobular cancer Follows with Dr. 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  Her son is now in remission with colon cancer.    Past Medical History:  Diagnosis Date   Anxiety    Arthritis    "back" (06/05/2018)   Breast cancer, right breast ([place]) 03/2018   Chronic lower back pain    "if I don't do yoga" (06/05/2018)   Depression    Family history of breast cancer    GERD (gastroesophageal reflux disease)    Herpes simplex    on nose   History of hiatal hernia    Macular degeneration    Per  [facility] New Patient Packet    Nephritis    "as a child" (06/05/2018)   Osteoporosis    SCC (squamous cell carcinoma)    "under my nose" (06/05/2018)   Strabismus    Per [place] new patient packet    UTI (urinary tract infection) 8/15   Wears glasses     Past Surgical History:  Procedure Laterality Date   BREAST BIOPSY Right 04/2018   CATARACT EXTRACTION W/ INTRAOCULAR LENS  IMPLANT, BILATERAL Bilateral    CESAREAN SECTION  1977   COLONOSCOPY     CYST REMOVAL NECK Left 03/03/2014   Procedure: LEFT NECK CYST EXCISION;  Surgeon: Pedro Earls, MD;  Location: [place];  Service: General;  Laterality: Left;   DILATION AND CURETTAGE OF UTERUS     EYE SURGERY     Strabismus   MASTECTOMY Left 06/05/2018   MASTECTOMY COMPLETE / SIMPLE W/ SENTINEL NODE BIOPSY Right 06/05/2018   AND BLUE DYE INJECTION RIGHT BREAST   MASTECTOMY W/ SENTINEL NODE BIOPSY Bilateral 06/05/2018   Procedure: BILATERAL MASTECTOMIES WITH RIGHT SENTINEL LYMPH NODE BIOPSY AND BLUE DYE INJECTION RIGHT BREAST;  Surgeon: Donnie Mesa, MD;  Location: [place];  Service: General;  Laterality: Bilateral;   SQUAMOUS CELL CARCINOMA EXCISION     "under my nose"   STRABISMUS SURGERY Bilateral 05/02/2018   Procedure: BILATERAL REPAIR STRABISMUS;  Surgeon:  Lamonte Sakai, MD;  Location[facility];  Service: Ophthalmology;  Laterality: Bilateral;   TONSILLECTOMY AND ADENOIDECTOMY     UPPER GI ENDOSCOPY     WISDOM TOOTH EXTRACTION      Allergies  Allergen Reactions   Bactrim [Sulfamethoxazole-Trimethoprim] Anaphylaxis, Hives and Rash    Outpatient Encounter Medications as of 02/15/2021  Medication Sig   CALCIUM-MAGNESIUM PO Take 1 tablet by mouth daily.    Cholecalciferol (VITAMIN D3) 50 MCG (2000 UT) capsule Take 1 capsule (2,000 Units total) by mouth in the morning and at bedtime.   desvenlafaxine (PRISTIQ) 50 MG 24 hr tablet Take 1 tablet (50 mg total) by mouth daily.   LORazepam (ATIVAN) 0.5 MG  tablet TAKE ONE TABLET AT BEDTIME, MAY TAKE AND EXTRA 1/2 TABLET AS NEEDED.   meloxicam (MOBIC) 15 MG tablet TAKE 1 TABLET ONCE DAILY.   psyllium (METAMUCIL) 58.6 % packet Take 1 packet by mouth daily.   tamoxifen (NOLVADEX) 10 MG tablet Take 1 tablet (10 mg total) by mouth 2 (two) times daily.   Boric Acid Vaginal 600 MG SUPP Place 1 capsule vaginally as needed.   ciprofloxacin (CIPRO) 500 MG tablet Take 1 tablet (500 mg total) by mouth 2 (two) times daily.   Digestive Enzymes (DIGESTIVE ENZYME PO) Take 1 capsule by mouth 3 (three) times daily.    estradiol (ESTRACE) 0.1 MG/GM vaginal cream Place 1 Applicatorful vaginally 3 (three) times a week.   Hypromellose (ARTIFICIAL TEARS OP) Place 1 drop into both eyes daily in the afternoon.   Ketotifen Fumarate (ALAWAY OP) Apply to eye. As needed for allergies   Magnesium Gluconate 500 (27 Mg) MG TABS Take 500 mg by mouth daily.    melatonin 3 MG TABS tablet Take 3 mg by mouth at bedtime as needed.   Multiple Vitamins-Minerals (PRESERVISION AREDS) CAPS Take 1 capsule by mouth 2 (two) times daily.    NON FORMULARY Take 1 capsule by mouth 2 (two) times daily. Bio-Complete 3   Nutritional Supplements (CYTO-REDOXIN PO) Take 2 oz by mouth in the morning and at bedtime.   Omega-3 Fatty Acids (OMEGA 3 PO) Take 1 capsule by mouth daily.    phenazopyridine (PYRIDIUM) 100 MG tablet Take 1 tablet (100 mg total) by mouth 3 (three) times daily as needed for pain.   Pramoxine HCl (VAGISIL ANTI-ITCH MEDICATED EX) Apply 1 application topically as needed.   Probiotic Product (PROBIOTIC PO) Take 1 capsule by mouth 2 (two) times daily. D-Mannose 1300 mg with Cranberry Extract   sodium chloride (OCEAN) 0.65 % SOLN nasal spray Place 1 spray into both nostrils daily as needed for congestion.   No facility-administered encounter medications on file as of 02/15/2021.    Review of Systems:  Review of Systems  Constitutional: Negative.   HENT: Negative.    Respiratory:  Negative.    Cardiovascular: Negative.   Gastrointestinal:  Positive for constipation.  Genitourinary: Negative.   Musculoskeletal:  Positive for back pain.  Skin: Negative.   Neurological: Negative.   Psychiatric/Behavioral:  The patient is nervous/anxious.    Health Maintenance  Topic Date Due   Hepatitis C Screening  Never done   INFLUENZA VACCINE  02/13/2021   COVID-19 Vaccine (5 - Booster for Moderna series) 04/07/2021   TETANUS/TDAP  11/03/2025   COLONOSCOPY (Pts 45-74yr Insurance coverage will need to be confirmed)  12/22/2029   DEXA SCAN  Completed   PNA vac Low Risk Adult  Completed   Zoster Vaccines- Shingrix  Completed  HPV VACCINES  Aged Out   MAMMOGRAM  Discontinued    Physical Exam: Vitals:   02/15/21 1513  BP: (!) 166/120  Pulse: 86  Temp: (!) 96.6 F (35.9 C)  SpO2: 96%  Weight: 137 lb 14.4 oz (62.6 kg)  Height: 5' 4.5" (1.638 m)   Body mass index is 23.3 kg/m. Physical Exam Constitutional: Oriented to person, place, and time. Well-developed and well-nourished.  HENT:  Head: Normocephalic.  Mouth/Throat: Oropharynx is clear and moist.  Eyes: Pupils are equal, round, and reactive to light.  Neck: Neck supple.  Cardiovascular: Normal rate and normal heart sounds.  No murmur heard. Pulmonary/Chest: Effort normal and breath sounds normal. No respiratory distress. No wheezes. She has no rales.  Abdominal: Soft. Bowel sounds are normal. No distension. There is no tenderness. There is no rebound.  Musculoskeletal: No edema.  Lymphadenopathy: none Neurological: Alert and oriented to person, place, and time.  Skin: Skin is warm and dry.  Psychiatric: Normal mood and affect. Behavior is normal. Thought content normal.   Labs reviewed: Basic Metabolic Panel: Recent Labs    06/15/20 1138 01/18/21 0919  NA 138 141  K 3.9 4.6  CL 103 106  CO2 31 26  GLUCOSE 96 81  BUN 15 18  CREATININE 0.85 0.96*  CALCIUM 9.5 9.4  TSH  --  5.32*   Liver  Function Tests: Recent Labs    06/15/20 1138 01/18/21 0919  AST 15 15  ALT 14 12  BILITOT 0.3 0.4  PROT 6.4 6.4   No results for input(s): LIPASE, AMYLASE in the last 8760 hours. No results for input(s): AMMONIA in the last 8760 hours. CBC: Recent Labs    06/15/20 1138 01/18/21 0919  WBC 6.7 5.1  NEUTROABS 4,301 2,193  HGB 12.6 12.8  HCT 37.3 39.0  MCV 88.6 88.8  PLT 177 212   Lipid Panel: Recent Labs    01/18/21 0919  CHOL 178  HDL 72  LDLCALC 84  TRIG 122  CHOLHDL 2.5   No results found for: HGBA1C  Procedures since last visit: No results found.  Assessment/Plan 1. Essential hypertension BP elevated  No Headache or any other symptoms D/w patient  It has never been this high in previous Visit No Recent change in diet or weight gain Will check her in AM again and if still high needs Antihypertensive  2. Recurrent UTI On Prophlactic Antibiotics Macrodantin 50 mg After sexual Activity Helping her staying stable  3. Osteoporosis without current pathological fracture, unspecified osteoporosis type On Tamoxifen and Vit D  4. Invasive lobular carcinoma of breast, stage 1, right [facility]) Per Oncology has to take it for 10 years Mostly feels like she has Myalgias and weight gain due to this  5. Vitamin D deficiency On High dose per Dr Renne Crigler Will check Levels Next blood Test  6. Chronic bilateral low back pain without sciatica ON Meloxicam Taper has failed before  Have discussed side effects with her  7. Ear cysts Follows with Dr Georgette Dover  8. Anxiety On Ativan 0.5 mg PRN  9. Gastroesophageal reflux disease without esophagitis Not on any Meds Says she can manage it herself  10. Moderate episode of recurrent major depressive disorder ([place]) On Pristiq  11. DNR (do not resuscitate) discussion Discussed and DNR signed  22. Constipation, unspecified constipation type Uses Fibre PRN    Labs/tests ordered:  * No order type specified * Next appt:   Visit date not found   Total time spent in  this patient care encounter was  60_  minutes; greater than 50% of the visit spent counseling patient and staff, reviewing records , Labs and coordinating care for problems addressed at this encounter.

## 2021-02-16 ENCOUNTER — Encounter: Payer: Self-pay | Admitting: Internal Medicine

## 2021-02-16 ENCOUNTER — Non-Acute Institutional Stay: Payer: Medicare PPO | Admitting: Internal Medicine

## 2021-02-16 ENCOUNTER — Other Ambulatory Visit: Payer: Self-pay

## 2021-02-16 VITALS — BP 160/100

## 2021-02-16 DIAGNOSIS — I10 Essential (primary) hypertension: Secondary | ICD-10-CM

## 2021-02-16 MED ORDER — AMLODIPINE BESYLATE 5 MG PO TABS: 5.0000 mg | ORAL_TABLET | Freq: Every day | ORAL | 3 refills | Status: DC

## 2021-02-16 NOTE — Progress Notes (Addendum)
Location: [place] of Service:  Clinic (12)  Provider:   Code Status: DNR Goals of Care:  Advanced Directives 02/16/2021  Does Patient Have a Medical Advance Directive? Yes  Type of Advance Directive [place]  Does patient want to make changes to medical advance directive? No - Patient declined  Copy of [place] in Chart? Yes - validated most recent copy scanned in chart (See row information)  Would patient like information on creating a medical advance directive? -  Pre-existing out of facility DNR order (yellow form or pink MOST form) -     Chief Complaint  Patient presents with   Acute Visit    Blood pressure recheck    HPI: Patient is a 70 y.o. female seen today for an acute visit for Follow up of her BP   Hypertension Patient was seen yesterday and her blood pressure was elevated at 180/110 This is the first time her blood pressure has been elevated.  She denied any headaches or chest pains .Came today for a blood pressure checkup again.  Continues to be asymptomatic. She also wanted to show me her supplements.  She is on probiotic, zinc and magnesium and CoQ10  Other issues  Recurrent UTI Was seen by urology and is now on prophylactic Macrodantin as needed Bilateral mastectomy for right breast invasive lobular cancer Follows with Dr. 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  Her son is now in remission with colon cancer.   Past Medical History:  Diagnosis Date   Anxiety    Arthritis    "back" (06/05/2018)   Breast cancer, right breast ([place]) 03/2018   Chronic lower back pain    "if I don't do yoga" (06/05/2018)   Depression    Family history of breast cancer    GERD  (gastroesophageal reflux disease)    Herpes simplex    on nose   History of hiatal hernia    Macular degeneration    Per [facility] New Patient Packet    Nephritis    "as a child" (06/05/2018)   Osteoporosis    SCC (squamous cell carcinoma)    "under my nose" (06/05/2018)   Strabismus    Per [place] new patient packet    UTI (urinary tract infection) 8/15   Wears glasses     Past Surgical History:  Procedure Laterality Date   BREAST BIOPSY Right 04/2018   CATARACT EXTRACTION W/ INTRAOCULAR LENS  IMPLANT, BILATERAL Bilateral    CESAREAN SECTION  1977   COLONOSCOPY     CYST REMOVAL NECK Left 03/03/2014   Procedure: LEFT NECK CYST EXCISION;  Surgeon: Pedro Earls, MD;  Location: [place];  Service: General;  Laterality: Left;   DILATION AND CURETTAGE OF UTERUS     EYE SURGERY     Strabismus   MASTECTOMY Left 06/05/2018   MASTECTOMY COMPLETE / SIMPLE W/ SENTINEL NODE BIOPSY Right 06/05/2018   AND BLUE DYE INJECTION RIGHT BREAST   MASTECTOMY W/ SENTINEL NODE BIOPSY Bilateral 06/05/2018   Procedure: BILATERAL MASTECTOMIES WITH RIGHT SENTINEL LYMPH NODE BIOPSY AND BLUE DYE INJECTION RIGHT BREAST;  Surgeon: Donnie Mesa, MD;  Location: [place];  Service: General;  Laterality: Bilateral;   SQUAMOUS  CELL CARCINOMA EXCISION     "under my nose"   STRABISMUS SURGERY Bilateral 05/02/2018   Procedure: BILATERAL REPAIR STRABISMUS;  Surgeon: Lamonte Sakai, MD;  Location: [place];  Service: Ophthalmology;  Laterality: Bilateral;   TONSILLECTOMY AND ADENOIDECTOMY     UPPER GI ENDOSCOPY     WISDOM TOOTH EXTRACTION      Allergies  Allergen Reactions   Bactrim [Sulfamethoxazole-Trimethoprim] Anaphylaxis, Hives and Rash    Outpatient Encounter Medications as of 02/16/2021  Medication Sig   Brimonidine Tartrate (LUMIFY) 0.025 % SOLN Apply to eye as needed.   CALCIUM-MAGNESIUM PO Take 1 tablet by mouth daily.    Cholecalciferol (VITAMIN D3) 50 MCG (2000 UT) capsule  Take 1 capsule (2,000 Units total) by mouth in the morning and at bedtime.   desvenlafaxine (PRISTIQ) 50 MG 24 hr tablet Take 1 tablet (50 mg total) by mouth daily.   Digestive Enzymes (DIGESTIVE ENZYME PO) Take 1 capsule by mouth 3 (three) times daily.    estradiol (ESTRACE) 0.1 MG/GM vaginal cream Place 1 Applicatorful vaginally 3 (three) times a week.   fluticasone (FLONASE) 50 MCG/ACT nasal spray Place into both nostrils as needed for allergies or rhinitis.   Hypromellose (ARTIFICIAL TEARS OP) Place 1 drop into both eyes daily in the afternoon.   Ketotifen Fumarate (ALAWAY OP) Apply to eye. As needed for allergies   LORazepam (ATIVAN) 0.5 MG tablet TAKE ONE TABLET AT BEDTIME, MAY TAKE AND EXTRA 1/2 TABLET AS NEEDED.   MELATONIN PO Take 1.25 mg by mouth at bedtime as needed.   meloxicam (MOBIC) 15 MG tablet TAKE 1 TABLET ONCE DAILY.   Multiple Vitamins-Minerals (PRESERVISION AREDS) CAPS Take 1 capsule by mouth 2 (two) times daily.    NON FORMULARY Take 1 capsule by mouth 2 (two) times daily. Bio-Complete 3   NON FORMULARY ASEA REDOX 2 ox drink 2x/day   NON FORMULARY D Mannose 2/day (1300 mg)   Nutritional Supplements (ARTHRO-COMPLEX PO) Take 1 capsule by mouth daily at 6 (six) AM.   Nutritional Supplements (CYTO-REDOXIN PO) Take 2 oz by mouth in the morning and at bedtime.   Nutritional Supplements (METABOLIC LIVER FORMULA PO) Take 1 capsule by mouth daily at 6 (six) AM.   Omega-3 Fatty Acids (OMEGA 3 PO) Take 1 capsule by mouth daily.    Pramoxine HCl (VAGISIL ANTI-ITCH MEDICATED EX) Apply 1 application topically as needed.   psyllium (METAMUCIL) 58.6 % packet Take 1 packet by mouth daily.   sodium chloride (OCEAN) 0.65 % SOLN nasal spray Place 1 spray into both nostrils daily as needed for congestion.   tamoxifen (NOLVADEX) 10 MG tablet Take 1 tablet (10 mg total) by mouth 2 (two) times daily.   Boric Acid Vaginal 600 MG SUPP Place 1 capsule vaginally as needed.   Magnesium Gluconate 500  (27 Mg) MG TABS Take 500 mg by mouth daily.    [DISCONTINUED] amLODipine (NORVASC) 5 MG tablet Take 1 tablet (5 mg total) by mouth daily.   [DISCONTINUED] nitrofurantoin (MACRODANTIN) 50 MG capsule Take 50 mg by mouth daily as needed.   [DISCONTINUED] Probiotic Product (PROBIOTIC PO) Take 1 capsule by mouth 2 (two) times daily. D-Mannose 1300 mg with Cranberry Extract   No facility-administered encounter medications on file as of 02/16/2021.    Review of Systems:  Review of Systems Review of Systems  Constitutional: Negative for activity change, appetite change, chills, diaphoresis, fatigue and fever.  HENT: Negative for mouth sores, postnasal drip, rhinorrhea, sinus pain and sore  throat.   Respiratory: Negative for apnea, cough, chest tightness, shortness of breath and wheezing.   Cardiovascular: Negative for chest pain, palpitations and leg swelling.  Gastrointestinal: Negative for abdominal distention, abdominal pain, constipation, diarrhea, nausea and vomiting.  Genitourinary: Negative for dysuria and frequency.  Musculoskeletal: Negative for arthralgias, joint swelling and myalgias.  Skin: Negative for rash.  Neurological: Negative for dizziness, syncope, weakness, light-headedness and numbness.  Psychiatric/Behavioral: Negative for behavioral problems, confusion and sleep disturbance.   Health Maintenance  Topic Date Due   Hepatitis C Screening  Never done   INFLUENZA VACCINE  02/13/2021   COVID-19 Vaccine (5 - Booster for Moderna series) 04/07/2021   TETANUS/TDAP  11/03/2025   COLONOSCOPY (Pts 45-64yr Insurance coverage will need to be confirmed)  12/22/2029   DEXA SCAN  Completed   PNA vac Low Risk Adult  Completed   Zoster Vaccines- Shingrix  Completed   HPV VACCINES  Aged Out   MAMMOGRAM  Discontinued    Physical Exam: Vitals:   02/16/21 1010  BP: (!) 160/100   There is no height or weight on file to calculate BMI. Physical Exam Constitutional: Oriented to person,  place, and time. Well-developed and well-nourished.  HENT:  Head: Normocephalic.  Mouth/Throat: Oropharynx is clear and moist.  Eyes: Pupils are equal, round, and reactive to light.  Neck: Neck supple.  Cardiovascular: Normal rate and normal heart sounds.  No murmur heard. Pulmonary/Chest: Effort normal and breath sounds normal. No respiratory distress. No wheezes. She has no rales.  Abdominal: Soft. Bowel sounds are normal. No distension. There is no tenderness. There is no rebound.  Musculoskeletal: No edema.  Lymphadenopathy: none Neurological: Alert and oriented to person, place, and time.  Skin: Skin is warm and dry.  Psychiatric: Normal mood and affect. Behavior is normal. Thought content normal.   Labs reviewed: Basic Metabolic Panel: Recent Labs    06/15/20 1138 01/18/21 0919  NA 138 141  K 3.9 4.6  CL 103 106  CO2 31 26  GLUCOSE 96 81  BUN 15 18  CREATININE 0.85 0.96*  CALCIUM 9.5 9.4  TSH  --  5.32*   Liver Function Tests: Recent Labs    06/15/20 1138 01/18/21 0919  AST 15 15  ALT 14 12  BILITOT 0.3 0.4  PROT 6.4 6.4   No results for input(s): LIPASE, AMYLASE in the last 8760 hours. No results for input(s): AMMONIA in the last 8760 hours. CBC: Recent Labs    06/15/20 1138 01/18/21 0919  WBC 6.7 5.1  NEUTROABS 4,301 2,193  HGB 12.6 12.8  HCT 37.3 39.0  MCV 88.6 88.8  PLT 177 212   Lipid Panel: Recent Labs    01/18/21 0919  CHOL 178  HDL 72  LDLCALC 84  TRIG 122  CHOLHDL 2.5   No results found for: HGBA1C  Procedures since last visit: No results found.  Assessment/Plan Essential hypertension Started on Norvasc 5 mg Qd Side effects discussed Follow up in 4-6 weeks for BP  Other issues  2. Recurrent UTI On Prophlactic Antibiotics Macrodantin 50 mg After sexual Activity Helping her staying stable   3. Osteoporosis without current pathological fracture, unspecified osteoporosis type On Tamoxifen and Vit D   4. Invasive lobular  carcinoma of breast, stage 1, right ([facility] Per Oncology has to take it for 10 years Mostly feels like she has Myalgias and weight gain due to this   5. Vitamin D deficiency On High dose per Dr GRenne CriglerWill check Levels Next  blood Test   6. Chronic bilateral low back pain without sciatica ON Meloxicam Taper has failed before   Have discussed side effects with her   7. Ear cysts Follows with Dr Georgette Dover   8. Anxiety On Ativan 0.5 mg PRN   9. Gastroesophageal reflux disease without esophagitis Not on any Meds Says she can manage it herself   10. Moderate episode of recurrent major depressive disorder ([place]) On Pristiq   11. DNR (do not resuscitate) discussion Discussed and DNR signed   36. Constipation, unspecified constipation type Uses Fibre PRN Labs/tests ordered:  * No order type specified * Next appt:  Visit date not found

## 2021-02-17 ENCOUNTER — Telehealth: Payer: Self-pay

## 2021-02-17 MED ORDER — AMLODIPINE BESYLATE 5 MG PO TABS: 5.0000 mg | ORAL_TABLET | Freq: Every day | ORAL | 3 refills | Status: DC

## 2021-02-17 NOTE — Telephone Encounter (Signed)
Called patient. DWP, scheduled.

## 2021-02-17 NOTE — Addendum Note (Signed)
Addended by: Georgina Snell on: 02/17/2021 09:33 AM   Modules accepted: Orders

## 2021-02-17 NOTE — Telephone Encounter (Signed)
Per Dr. Nyra Market need a 6 week follow up with either Dr. Lyndel Safe or [facility].  Called patient. No answer. LMOM to return call to schedule.

## 2021-03-11 DIAGNOSIS — M545 Low back pain, unspecified: Secondary | ICD-10-CM | POA: Diagnosis not present

## 2021-03-22 DIAGNOSIS — Z9012 Acquired absence of left breast and nipple: Secondary | ICD-10-CM | POA: Diagnosis not present

## 2021-03-22 DIAGNOSIS — C50911 Malignant neoplasm of unspecified site of right female breast: Secondary | ICD-10-CM | POA: Diagnosis not present

## 2021-03-29 ENCOUNTER — Other Ambulatory Visit: Payer: Self-pay

## 2021-03-29 ENCOUNTER — Encounter: Payer: Self-pay | Admitting: Internal Medicine

## 2021-03-29 ENCOUNTER — Non-Acute Institutional Stay: Payer: Medicare PPO | Admitting: Internal Medicine

## 2021-03-29 VITALS — BP 134/88 | HR 85 | Temp 96.1°F | Ht 64.5 in | Wt 136.2 lb

## 2021-03-29 DIAGNOSIS — I10 Essential (primary) hypertension: Secondary | ICD-10-CM | POA: Diagnosis not present

## 2021-04-01 NOTE — Progress Notes (Signed)
Location: [place] of Service:  Clinic (12)  Provider:   Code Status: DNR Goals of Care:  Advanced Directives 03/29/2021  Does Patient Have a Medical Advance Directive? Yes  Type of Advance Directive [place]  Does patient want to make changes to medical advance directive? No - Patient declined  Copy of [place] in Chart? Yes - validated most recent copy scanned in chart (See row information)  Would patient like information on creating a medical advance directive? -  Pre-existing out of facility DNR order (yellow form or pink MOST form) -     Chief Complaint  Patient presents with   Medical Management of Chronic Issues    Patient returns to the clinic for follow up on blood pressure.    Quality Metric Gaps    Hep C and flu shot    HPI: Patient is a 70 y.o. female seen today for an acute visit for Follow up of BP Was started on Norvasc last visit  Patient was started on Norvasc on Last visit due to persistently high BP Since then her BP readings at home are better SBP around 130-140 No Dizziness Just Worsening of her constipation symptoms She is managing well   Other issues Recurrent UTI Was seen by urology and is now on prophylactic Macrodantin as needed Bilateral mastectomy for right breast invasive lobular cancer Follows with Dr. 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  Her son is now in remission with colon cancer.      Past Medical History:  Diagnosis Date   Anxiety    Arthritis    "back" (06/05/2018)   Breast cancer, right breast ([place]) 03/2018   Chronic lower back pain    "if I don't do yoga" (06/05/2018)   Depression    Family history of breast cancer    GERD  (gastroesophageal reflux disease)    Herpes simplex    on nose   History of hiatal hernia    Macular degeneration    Per [facility] New Patient Packet    Nephritis    "as a child" (06/05/2018)   Osteoporosis    SCC (squamous cell carcinoma)    "under my nose" (06/05/2018)   Strabismus    Per [place] new patient packet    UTI (urinary tract infection) 8/15   Wears glasses     Past Surgical History:  Procedure Laterality Date   BREAST BIOPSY Right 04/2018   CATARACT EXTRACTION W/ INTRAOCULAR LENS  IMPLANT, BILATERAL Bilateral    CESAREAN SECTION  1977   COLONOSCOPY     CYST REMOVAL NECK Left 03/03/2014   Procedure: LEFT NECK CYST EXCISION;  Surgeon: Pedro Earls, MD;  Location: [place];  Service: General;  Laterality: Left;   DILATION AND CURETTAGE OF UTERUS     EYE SURGERY     Strabismus   MASTECTOMY Left 06/05/2018   MASTECTOMY COMPLETE / SIMPLE W/ SENTINEL NODE BIOPSY Right 06/05/2018   AND BLUE DYE INJECTION RIGHT BREAST   MASTECTOMY W/ SENTINEL NODE BIOPSY Bilateral 06/05/2018   Procedure: BILATERAL MASTECTOMIES WITH RIGHT SENTINEL LYMPH NODE BIOPSY AND BLUE DYE INJECTION RIGHT BREAST;  Surgeon: Donnie Mesa, MD;  Location: [place];  Service: General;  Laterality: Bilateral;   SQUAMOUS CELL CARCINOMA EXCISION     "under my nose"   STRABISMUS SURGERY Bilateral 05/02/2018   Procedure: BILATERAL REPAIR STRABISMUS;  Surgeon: Lamonte Sakai, MD;  Location: [place];  Service: Ophthalmology;  Laterality: Bilateral;   TONSILLECTOMY AND ADENOIDECTOMY     UPPER GI ENDOSCOPY     WISDOM TOOTH EXTRACTION      Allergies  Allergen Reactions   Bactrim [Sulfamethoxazole-Trimethoprim] Anaphylaxis, Hives and Rash    Outpatient Encounter Medications as of 03/29/2021  Medication Sig   amLODipine (NORVASC) 5 MG tablet Take 1 tablet (5 mg total) by mouth daily.   CALCIUM-MAGNESIUM PO Take 1 tablet by mouth daily.    Cholecalciferol (VITAMIN D3) 50 MCG (2000  UT) capsule Take 1 capsule (2,000 Units total) by mouth in the morning and at bedtime.   desvenlafaxine (PRISTIQ) 50 MG 24 hr tablet Take 1 tablet (50 mg total) by mouth daily.   Digestive Enzymes (DIGESTIVE ENZYME PO) Take 1 capsule by mouth 3 (three) times daily.    estradiol (ESTRACE) 0.1 MG/GM vaginal cream Place 1 Applicatorful vaginally 3 (three) times a week.   fluticasone (FLONASE) 50 MCG/ACT nasal spray Place into both nostrils as needed for allergies or rhinitis.   Hypromellose (ARTIFICIAL TEARS OP) Place 1 drop into both eyes daily in the afternoon.   Ketotifen Fumarate (ALAWAY OP) Apply to eye. As needed for allergies   LORazepam (ATIVAN) 0.5 MG tablet TAKE ONE TABLET AT BEDTIME, MAY TAKE AND EXTRA 1/2 TABLET AS NEEDED.   MELATONIN PO Take 1.25 mg by mouth at bedtime as needed.   meloxicam (MOBIC) 15 MG tablet TAKE 1 TABLET ONCE DAILY.   Multiple Vitamins-Minerals (PRESERVISION AREDS) CAPS Take 1 capsule by mouth 2 (two) times daily.    NON FORMULARY Take 1 capsule by mouth 2 (two) times daily. Bio-Complete 3   NON FORMULARY ASEA REDOX 2 ox drink daily   NON FORMULARY D Mannose 2/day (1300 mg)   Nutritional Supplements (ARTHRO-COMPLEX PO) Take 1 capsule by mouth daily at 6 (six) AM.   Nutritional Supplements (CYTO-REDOXIN PO) Take 2 oz by mouth in the morning and at bedtime.   Nutritional Supplements (METABOLIC LIVER FORMULA PO) Take 1 capsule by mouth daily at 6 (six) AM.   Omega-3 Fatty Acids (OMEGA 3 PO) Take 1 capsule by mouth daily.    psyllium (METAMUCIL) 58.6 % packet Take 1 packet by mouth daily.   sodium chloride (OCEAN) 0.65 % SOLN nasal spray Place 1 spray into both nostrils daily as needed for congestion.   tamoxifen (NOLVADEX) 10 MG tablet Take 1 tablet (10 mg total) by mouth 2 (two) times daily.   Magnesium Gluconate 500 (27 Mg) MG TABS Take 500 mg by mouth daily.    [DISCONTINUED] Boric Acid Vaginal 600 MG SUPP Place 1 capsule vaginally as needed.   [DISCONTINUED]  Brimonidine Tartrate (LUMIFY) 0.025 % SOLN Apply to eye as needed.   [DISCONTINUED] Pramoxine HCl (VAGISIL ANTI-ITCH MEDICATED EX) Apply 1 application topically as needed.   [DISCONTINUED] psyllium (METAMUCIL) 58.6 % packet Take 1 packet by mouth daily.   No facility-administered encounter medications on file as of 03/29/2021.    Review of Systems:  Review of Systems  Constitutional: Negative.   HENT: Negative.    Respiratory: Negative.    Cardiovascular:  Positive for leg swelling.  Gastrointestinal:  Positive for constipation.  Genitourinary: Negative.   Musculoskeletal: Negative.   Skin: Negative.   Neurological:  Negative for dizziness.  Psychiatric/Behavioral: Negative.    All other systems reviewed and are negative.  Health Maintenance  Topic Date Due   Hepatitis C Screening  Never done   INFLUENZA VACCINE  02/13/2021   COVID-19 Vaccine (5 - Booster for Moderna series) 04/07/2021   TETANUS/TDAP  11/03/2025   COLONOSCOPY (Pts 45-79yr Insurance coverage will need to be confirmed)  12/22/2029   DEXA SCAN  Completed   Zoster Vaccines- Shingrix  Completed   HPV VACCINES  Aged Out   MAMMOGRAM  Discontinued    Physical Exam: Vitals:   03/29/21 1432  BP: 134/88  Pulse: 85  Temp: (!) 96.1 F (35.6 C)  SpO2: 96%  Weight: 136 lb 3.2 oz (61.8 kg)  Height: 5' 4.5" (1.638 m)   Body mass index is 23.02 kg/m. Physical Exam Constitutional: Oriented to person, place, and time. Well-developed and well-nourished.  HENT:  Head: Normocephalic.  Mouth/Throat: Oropharynx is clear and moist.  Eyes: Pupils are equal, round, and reactive to light.  Neck: Neck supple.  Cardiovascular: Normal rate and normal heart sounds.  No murmur heard. Pulmonary/Chest: Effort normal and breath sounds normal. No respiratory distress. No wheezes. She has no rales.  Abdominal: Soft. Bowel sounds are normal. No distension. There is no tenderness. There is no rebound.  Musculoskeletal: Mild Edema   Lymphadenopathy: none Neurological: Alert and oriented to person, place, and time.  Skin: Skin is warm and dry.  Psychiatric: Normal mood and affect. Behavior is normal. Thought content normal.   Labs reviewed: Basic Metabolic Panel: Recent Labs    06/15/20 1138 01/18/21 0919  NA 138 141  K 3.9 4.6  CL 103 106  CO2 31 26  GLUCOSE 96 81  BUN 15 18  CREATININE 0.85 0.96*  CALCIUM 9.5 9.4  TSH  --  5.32*   Liver Function Tests: Recent Labs    06/15/20 1138 01/18/21 0919  AST 15 15  ALT 14 12  BILITOT 0.3 0.4  PROT 6.4 6.4   No results for input(s): LIPASE, AMYLASE in the last 8760 hours. No results for input(s): AMMONIA in the last 8760 hours. CBC: Recent Labs    06/15/20 1138 01/18/21 0919  WBC 6.7 5.1  NEUTROABS 4,301 2,193  HGB 12.6 12.8  HCT 37.3 39.0  MCV 88.6 88.8  PLT 177 212   Lipid Panel: Recent Labs    01/18/21 0919  CHOL 178  HDL 72  LDLCALC 84  TRIG 122  CHOLHDL 2.5   No results found for: HGBA1C  Procedures since last visit: No results found.  Assessment/Plan Essential hypertension Continue Norvasc Tolerating well  Other issues Recurrent UTI On Prophlactic Antibiotics Macrodantin 50 mg After sexual Activity Helping her staying stable   3. Osteoporosis without current pathological fracture, unspecified osteoporosis type On Tamoxifen and Vit D   4. Invasive lobular carcinoma of breast, stage 1, right ([facility] Per Oncology has to take it for 10 years Mostly feels like she has Myalgias and weight gain due to this   5. Vitamin D deficiency On High dose per Dr GRenne CriglerWill check Levels Next blood Test   6. Chronic bilateral low back pain without sciatica ON Meloxicam Taper has failed before   Have discussed side effects with her   7. Ear cysts Follows with Dr TGeorgette Dover  8. Anxiety On Ativan 0.5 mg PRN   9. Gastroesophageal reflux disease without esophagitis Not on any Meds Says she can manage it herself   10. Moderate  episode of recurrent major depressive  disorder ([place]) On Pristiq   11. DNR (do not resuscitate) discussion Discussed and DNR signed   52. Constipation, unspecified constipation type Uses Fibre PRN  Labs/tests ordered:  * No order type specified * Next appt:  Visit date not found

## 2021-04-03 ENCOUNTER — Other Ambulatory Visit: Payer: Self-pay | Admitting: Orthopedic Surgery

## 2021-04-03 NOTE — Telephone Encounter (Signed)
Pharmacy requested refill Epic LR: 02/08/2021 Pended Rx and sent to Amy for approval.

## 2021-04-04 DIAGNOSIS — M545 Low back pain, unspecified: Secondary | ICD-10-CM | POA: Diagnosis not present

## 2021-04-04 DIAGNOSIS — M6281 Muscle weakness (generalized): Secondary | ICD-10-CM | POA: Diagnosis not present

## 2021-04-07 DIAGNOSIS — M545 Low back pain, unspecified: Secondary | ICD-10-CM | POA: Diagnosis not present

## 2021-04-07 DIAGNOSIS — M6281 Muscle weakness (generalized): Secondary | ICD-10-CM | POA: Diagnosis not present

## 2021-04-11 ENCOUNTER — Ambulatory Visit: Payer: Medicare PPO | Admitting: Sports Medicine

## 2021-04-12 DIAGNOSIS — M545 Low back pain, unspecified: Secondary | ICD-10-CM | POA: Diagnosis not present

## 2021-04-12 DIAGNOSIS — M6281 Muscle weakness (generalized): Secondary | ICD-10-CM | POA: Diagnosis not present

## 2021-04-17 DIAGNOSIS — M545 Low back pain, unspecified: Secondary | ICD-10-CM | POA: Diagnosis not present

## 2021-04-17 DIAGNOSIS — M6281 Muscle weakness (generalized): Secondary | ICD-10-CM | POA: Diagnosis not present

## 2021-04-19 DIAGNOSIS — M545 Low back pain, unspecified: Secondary | ICD-10-CM | POA: Diagnosis not present

## 2021-04-19 DIAGNOSIS — M6281 Muscle weakness (generalized): Secondary | ICD-10-CM | POA: Diagnosis not present

## 2021-04-24 DIAGNOSIS — M6281 Muscle weakness (generalized): Secondary | ICD-10-CM | POA: Diagnosis not present

## 2021-04-24 DIAGNOSIS — M545 Low back pain, unspecified: Secondary | ICD-10-CM | POA: Diagnosis not present

## 2021-04-26 DIAGNOSIS — L814 Other melanin hyperpigmentation: Secondary | ICD-10-CM | POA: Diagnosis not present

## 2021-04-26 DIAGNOSIS — L821 Other seborrheic keratosis: Secondary | ICD-10-CM | POA: Diagnosis not present

## 2021-04-26 DIAGNOSIS — D225 Melanocytic nevi of trunk: Secondary | ICD-10-CM | POA: Diagnosis not present

## 2021-04-26 DIAGNOSIS — L570 Actinic keratosis: Secondary | ICD-10-CM | POA: Diagnosis not present

## 2021-04-26 DIAGNOSIS — L820 Inflamed seborrheic keratosis: Secondary | ICD-10-CM | POA: Diagnosis not present

## 2021-04-26 DIAGNOSIS — Z85828 Personal history of other malignant neoplasm of skin: Secondary | ICD-10-CM | POA: Diagnosis not present

## 2021-04-27 DIAGNOSIS — M6281 Muscle weakness (generalized): Secondary | ICD-10-CM | POA: Diagnosis not present

## 2021-04-27 DIAGNOSIS — M545 Low back pain, unspecified: Secondary | ICD-10-CM | POA: Diagnosis not present

## 2021-05-01 DIAGNOSIS — M545 Low back pain, unspecified: Secondary | ICD-10-CM | POA: Diagnosis not present

## 2021-05-01 DIAGNOSIS — M6281 Muscle weakness (generalized): Secondary | ICD-10-CM | POA: Diagnosis not present

## 2021-05-04 DIAGNOSIS — M6281 Muscle weakness (generalized): Secondary | ICD-10-CM | POA: Diagnosis not present

## 2021-05-04 DIAGNOSIS — M545 Low back pain, unspecified: Secondary | ICD-10-CM | POA: Diagnosis not present

## 2021-05-08 DIAGNOSIS — M6281 Muscle weakness (generalized): Secondary | ICD-10-CM | POA: Diagnosis not present

## 2021-05-08 DIAGNOSIS — M545 Low back pain, unspecified: Secondary | ICD-10-CM | POA: Diagnosis not present

## 2021-05-11 DIAGNOSIS — M6281 Muscle weakness (generalized): Secondary | ICD-10-CM | POA: Diagnosis not present

## 2021-05-11 DIAGNOSIS — M545 Low back pain, unspecified: Secondary | ICD-10-CM | POA: Diagnosis not present

## 2021-05-12 ENCOUNTER — Other Ambulatory Visit: Payer: Self-pay | Admitting: Orthopedic Surgery

## 2021-05-12 NOTE — Telephone Encounter (Signed)
Patient has request refill on medication "Lorazepam". Patient last refill was 04/03/2021. Patient has Non Opioid contract on file from 08/05/2019. Update contract added to patient appointment note for upcoming appointment. Medication pend and sent to Mast, Man, NP.

## 2021-05-15 DIAGNOSIS — M6281 Muscle weakness (generalized): Secondary | ICD-10-CM | POA: Diagnosis not present

## 2021-05-15 DIAGNOSIS — M545 Low back pain, unspecified: Secondary | ICD-10-CM | POA: Diagnosis not present

## 2021-05-18 DIAGNOSIS — M6281 Muscle weakness (generalized): Secondary | ICD-10-CM | POA: Diagnosis not present

## 2021-05-18 DIAGNOSIS — M545 Low back pain, unspecified: Secondary | ICD-10-CM | POA: Diagnosis not present

## 2021-05-22 DIAGNOSIS — M545 Low back pain, unspecified: Secondary | ICD-10-CM | POA: Diagnosis not present

## 2021-05-22 DIAGNOSIS — M6281 Muscle weakness (generalized): Secondary | ICD-10-CM | POA: Diagnosis not present

## 2021-05-25 DIAGNOSIS — M545 Low back pain, unspecified: Secondary | ICD-10-CM | POA: Diagnosis not present

## 2021-05-25 DIAGNOSIS — M6281 Muscle weakness (generalized): Secondary | ICD-10-CM | POA: Diagnosis not present

## 2021-05-29 DIAGNOSIS — N952 Postmenopausal atrophic vaginitis: Secondary | ICD-10-CM | POA: Diagnosis not present

## 2021-05-29 DIAGNOSIS — N3020 Other chronic cystitis without hematuria: Secondary | ICD-10-CM | POA: Diagnosis not present

## 2021-06-01 DIAGNOSIS — M6281 Muscle weakness (generalized): Secondary | ICD-10-CM | POA: Diagnosis not present

## 2021-06-01 DIAGNOSIS — M545 Low back pain, unspecified: Secondary | ICD-10-CM | POA: Diagnosis not present

## 2021-06-05 DIAGNOSIS — M6281 Muscle weakness (generalized): Secondary | ICD-10-CM | POA: Diagnosis not present

## 2021-06-05 DIAGNOSIS — M545 Low back pain, unspecified: Secondary | ICD-10-CM | POA: Diagnosis not present

## 2021-06-09 DIAGNOSIS — M6281 Muscle weakness (generalized): Secondary | ICD-10-CM | POA: Diagnosis not present

## 2021-06-09 DIAGNOSIS — M545 Low back pain, unspecified: Secondary | ICD-10-CM | POA: Diagnosis not present

## 2021-06-12 DIAGNOSIS — M6281 Muscle weakness (generalized): Secondary | ICD-10-CM | POA: Diagnosis not present

## 2021-06-12 DIAGNOSIS — M545 Low back pain, unspecified: Secondary | ICD-10-CM | POA: Diagnosis not present

## 2021-06-14 DIAGNOSIS — H5000 Unspecified esotropia: Secondary | ICD-10-CM | POA: Diagnosis not present

## 2021-06-14 DIAGNOSIS — H353132 Nonexudative age-related macular degeneration, bilateral, intermediate dry stage: Secondary | ICD-10-CM | POA: Diagnosis not present

## 2021-06-14 DIAGNOSIS — H52203 Unspecified astigmatism, bilateral: Secondary | ICD-10-CM | POA: Diagnosis not present

## 2021-06-14 DIAGNOSIS — H04123 Dry eye syndrome of bilateral lacrimal glands: Secondary | ICD-10-CM | POA: Diagnosis not present

## 2021-07-05 DIAGNOSIS — H1033 Unspecified acute conjunctivitis, bilateral: Secondary | ICD-10-CM | POA: Diagnosis not present

## 2021-07-05 DIAGNOSIS — H00012 Hordeolum externum right lower eyelid: Secondary | ICD-10-CM | POA: Diagnosis not present

## 2021-07-19 DIAGNOSIS — H0012 Chalazion right lower eyelid: Secondary | ICD-10-CM | POA: Diagnosis not present

## 2021-07-19 DIAGNOSIS — H5000 Unspecified esotropia: Secondary | ICD-10-CM | POA: Diagnosis not present

## 2021-08-07 DIAGNOSIS — N952 Postmenopausal atrophic vaginitis: Secondary | ICD-10-CM | POA: Diagnosis not present

## 2021-08-07 DIAGNOSIS — Z124 Encounter for screening for malignant neoplasm of cervix: Secondary | ICD-10-CM | POA: Diagnosis not present

## 2021-08-07 DIAGNOSIS — Z6824 Body mass index (BMI) 24.0-24.9, adult: Secondary | ICD-10-CM | POA: Diagnosis not present

## 2021-08-15 DIAGNOSIS — L820 Inflamed seborrheic keratosis: Secondary | ICD-10-CM | POA: Diagnosis not present

## 2021-08-15 DIAGNOSIS — D225 Melanocytic nevi of trunk: Secondary | ICD-10-CM | POA: Diagnosis not present

## 2021-08-15 DIAGNOSIS — Z1283 Encounter for screening for malignant neoplasm of skin: Secondary | ICD-10-CM | POA: Diagnosis not present

## 2021-08-21 DIAGNOSIS — M5136 Other intervertebral disc degeneration, lumbar region: Secondary | ICD-10-CM | POA: Diagnosis not present

## 2021-08-21 DIAGNOSIS — M9902 Segmental and somatic dysfunction of thoracic region: Secondary | ICD-10-CM | POA: Diagnosis not present

## 2021-08-21 DIAGNOSIS — M9901 Segmental and somatic dysfunction of cervical region: Secondary | ICD-10-CM | POA: Diagnosis not present

## 2021-08-21 DIAGNOSIS — M9903 Segmental and somatic dysfunction of lumbar region: Secondary | ICD-10-CM | POA: Diagnosis not present

## 2021-08-21 DIAGNOSIS — M9904 Segmental and somatic dysfunction of sacral region: Secondary | ICD-10-CM | POA: Diagnosis not present

## 2021-08-23 DIAGNOSIS — M9902 Segmental and somatic dysfunction of thoracic region: Secondary | ICD-10-CM | POA: Diagnosis not present

## 2021-08-23 DIAGNOSIS — M9901 Segmental and somatic dysfunction of cervical region: Secondary | ICD-10-CM | POA: Diagnosis not present

## 2021-08-23 DIAGNOSIS — M9904 Segmental and somatic dysfunction of sacral region: Secondary | ICD-10-CM | POA: Diagnosis not present

## 2021-08-23 DIAGNOSIS — M9903 Segmental and somatic dysfunction of lumbar region: Secondary | ICD-10-CM | POA: Diagnosis not present

## 2021-08-23 DIAGNOSIS — M5136 Other intervertebral disc degeneration, lumbar region: Secondary | ICD-10-CM | POA: Diagnosis not present

## 2021-08-25 DIAGNOSIS — M5136 Other intervertebral disc degeneration, lumbar region: Secondary | ICD-10-CM | POA: Diagnosis not present

## 2021-08-25 DIAGNOSIS — M9902 Segmental and somatic dysfunction of thoracic region: Secondary | ICD-10-CM | POA: Diagnosis not present

## 2021-08-25 DIAGNOSIS — M9904 Segmental and somatic dysfunction of sacral region: Secondary | ICD-10-CM | POA: Diagnosis not present

## 2021-08-25 DIAGNOSIS — M9903 Segmental and somatic dysfunction of lumbar region: Secondary | ICD-10-CM | POA: Diagnosis not present

## 2021-08-25 DIAGNOSIS — M9901 Segmental and somatic dysfunction of cervical region: Secondary | ICD-10-CM | POA: Diagnosis not present

## 2021-09-06 ENCOUNTER — Other Ambulatory Visit: Payer: Self-pay | Admitting: Internal Medicine

## 2021-09-06 DIAGNOSIS — M5136 Other intervertebral disc degeneration, lumbar region: Secondary | ICD-10-CM | POA: Diagnosis not present

## 2021-09-06 DIAGNOSIS — M9901 Segmental and somatic dysfunction of cervical region: Secondary | ICD-10-CM | POA: Diagnosis not present

## 2021-09-06 DIAGNOSIS — F331 Major depressive disorder, recurrent, moderate: Secondary | ICD-10-CM

## 2021-09-06 DIAGNOSIS — M9902 Segmental and somatic dysfunction of thoracic region: Secondary | ICD-10-CM | POA: Diagnosis not present

## 2021-09-06 DIAGNOSIS — M9904 Segmental and somatic dysfunction of sacral region: Secondary | ICD-10-CM | POA: Diagnosis not present

## 2021-09-06 DIAGNOSIS — E559 Vitamin D deficiency, unspecified: Secondary | ICD-10-CM

## 2021-09-06 DIAGNOSIS — I10 Essential (primary) hypertension: Secondary | ICD-10-CM

## 2021-09-06 DIAGNOSIS — M810 Age-related osteoporosis without current pathological fracture: Secondary | ICD-10-CM

## 2021-09-06 DIAGNOSIS — M9903 Segmental and somatic dysfunction of lumbar region: Secondary | ICD-10-CM | POA: Diagnosis not present

## 2021-09-08 DIAGNOSIS — M5136 Other intervertebral disc degeneration, lumbar region: Secondary | ICD-10-CM | POA: Diagnosis not present

## 2021-09-08 DIAGNOSIS — M9902 Segmental and somatic dysfunction of thoracic region: Secondary | ICD-10-CM | POA: Diagnosis not present

## 2021-09-08 DIAGNOSIS — M9904 Segmental and somatic dysfunction of sacral region: Secondary | ICD-10-CM | POA: Diagnosis not present

## 2021-09-08 DIAGNOSIS — M9903 Segmental and somatic dysfunction of lumbar region: Secondary | ICD-10-CM | POA: Diagnosis not present

## 2021-09-08 DIAGNOSIS — M9901 Segmental and somatic dysfunction of cervical region: Secondary | ICD-10-CM | POA: Diagnosis not present

## 2021-09-11 DIAGNOSIS — M9902 Segmental and somatic dysfunction of thoracic region: Secondary | ICD-10-CM | POA: Diagnosis not present

## 2021-09-11 DIAGNOSIS — M5136 Other intervertebral disc degeneration, lumbar region: Secondary | ICD-10-CM | POA: Diagnosis not present

## 2021-09-11 DIAGNOSIS — M9901 Segmental and somatic dysfunction of cervical region: Secondary | ICD-10-CM | POA: Diagnosis not present

## 2021-09-11 DIAGNOSIS — M9903 Segmental and somatic dysfunction of lumbar region: Secondary | ICD-10-CM | POA: Diagnosis not present

## 2021-09-11 DIAGNOSIS — M9904 Segmental and somatic dysfunction of sacral region: Secondary | ICD-10-CM | POA: Diagnosis not present

## 2021-09-14 ENCOUNTER — Other Ambulatory Visit: Payer: Self-pay

## 2021-09-14 DIAGNOSIS — E559 Vitamin D deficiency, unspecified: Secondary | ICD-10-CM

## 2021-09-14 DIAGNOSIS — F331 Major depressive disorder, recurrent, moderate: Secondary | ICD-10-CM | POA: Diagnosis not present

## 2021-09-14 DIAGNOSIS — M810 Age-related osteoporosis without current pathological fracture: Secondary | ICD-10-CM | POA: Diagnosis not present

## 2021-09-14 DIAGNOSIS — I10 Essential (primary) hypertension: Secondary | ICD-10-CM | POA: Diagnosis not present

## 2021-09-14 DIAGNOSIS — R739 Hyperglycemia, unspecified: Secondary | ICD-10-CM | POA: Diagnosis not present

## 2021-09-15 DIAGNOSIS — M5136 Other intervertebral disc degeneration, lumbar region: Secondary | ICD-10-CM | POA: Diagnosis not present

## 2021-09-15 DIAGNOSIS — M9902 Segmental and somatic dysfunction of thoracic region: Secondary | ICD-10-CM | POA: Diagnosis not present

## 2021-09-15 DIAGNOSIS — M9901 Segmental and somatic dysfunction of cervical region: Secondary | ICD-10-CM | POA: Diagnosis not present

## 2021-09-15 DIAGNOSIS — M9903 Segmental and somatic dysfunction of lumbar region: Secondary | ICD-10-CM | POA: Diagnosis not present

## 2021-09-15 DIAGNOSIS — M9904 Segmental and somatic dysfunction of sacral region: Secondary | ICD-10-CM | POA: Diagnosis not present

## 2021-09-15 LAB — COMPLETE METABOLIC PANEL WITH GFR
AG Ratio: 1.5 (calc) (ref 1.0–2.5)
ALT: 14 U/L (ref 6–29)
AST: 16 U/L (ref 10–35)
Albumin: 3.9 g/dL (ref 3.6–5.1)
Alkaline phosphatase (APISO): 63 U/L (ref 37–153)
BUN: 23 mg/dL (ref 7–25)
CO2: 29 mmol/L (ref 20–32)
Calcium: 9.5 mg/dL (ref 8.6–10.4)
Chloride: 104 mmol/L (ref 98–110)
Creat: 0.89 mg/dL (ref 0.60–1.00)
Globulin: 2.6 g/dL (calc) (ref 1.9–3.7)
Glucose, Bld: 72 mg/dL (ref 65–99)
Potassium: 4.5 mmol/L (ref 3.5–5.3)
Sodium: 138 mmol/L (ref 135–146)
Total Bilirubin: 0.5 mg/dL (ref 0.2–1.2)
Total Protein: 6.5 g/dL (ref 6.1–8.1)
eGFR: 68 mL/min/{1.73_m2} (ref >=60)

## 2021-09-15 LAB — LIPID PANEL
Cholesterol: 185 mg/dL (ref <200)
HDL: 71 mg/dL (ref >=50)
LDL Cholesterol (Calc): 91 mg/dL (calc)
Non-HDL Cholesterol (Calc): 114 mg/dL (calc) (ref <130)
Total CHOL/HDL Ratio: 2.6 (calc) (ref <5.0)
Triglycerides: 124 mg/dL (ref <150)

## 2021-09-15 LAB — CBC WITH DIFFERENTIAL/PLATELET
Absolute Monocytes: 667 cells/uL (ref 200–950)
Basophils Absolute: 48 cells/uL (ref 0–200)
Basophils Relative: 1 %
Eosinophils Absolute: 178 cells/uL (ref 15–500)
Eosinophils Relative: 3.7 %
HCT: 37.3 % (ref 35.0–45.0)
Hemoglobin: 12.4 g/dL (ref 11.7–15.5)
Lymphs Abs: 1771 cells/uL (ref 850–3900)
MCH: 29.2 pg (ref 27.0–33.0)
MCHC: 33.2 g/dL (ref 32.0–36.0)
MCV: 88 fL (ref 80.0–100.0)
MPV: 11.4 fL (ref 7.5–12.5)
Monocytes Relative: 13.9 %
Neutro Abs: 2136 cells/uL (ref 1500–7800)
Neutrophils Relative %: 44.5 %
Platelets: 221 10*3/uL (ref 140–400)
RBC: 4.24 10*6/uL (ref 3.80–5.10)
RDW: 12.6 % (ref 11.0–15.0)
Total Lymphocyte: 36.9 %
WBC: 4.8 10*3/uL (ref 3.8–10.8)

## 2021-09-15 LAB — HEMOGLOBIN A1C
Hgb A1c MFr Bld: 5.6 % of total Hgb (ref <5.7)
Mean Plasma Glucose: 114 mg/dL
eAG (mmol/L): 6.3 mmol/L

## 2021-09-15 LAB — TSH: TSH: 5.56 mIU/L — ABNORMAL HIGH (ref 0.40–4.50)

## 2021-09-20 ENCOUNTER — Other Ambulatory Visit: Payer: Self-pay

## 2021-09-20 ENCOUNTER — Non-Acute Institutional Stay: Payer: Medicare PPO | Admitting: Internal Medicine

## 2021-09-20 ENCOUNTER — Encounter: Payer: Self-pay | Admitting: Internal Medicine

## 2021-09-20 VITALS — BP 164/94 | HR 103 | Temp 97.7°F | Ht 64.5 in | Wt 139.3 lb

## 2021-09-20 DIAGNOSIS — M545 Low back pain, unspecified: Secondary | ICD-10-CM | POA: Diagnosis not present

## 2021-09-20 DIAGNOSIS — E559 Vitamin D deficiency, unspecified: Secondary | ICD-10-CM

## 2021-09-20 DIAGNOSIS — F331 Major depressive disorder, recurrent, moderate: Secondary | ICD-10-CM | POA: Diagnosis not present

## 2021-09-20 DIAGNOSIS — I10 Essential (primary) hypertension: Secondary | ICD-10-CM

## 2021-09-20 DIAGNOSIS — M810 Age-related osteoporosis without current pathological fracture: Secondary | ICD-10-CM

## 2021-09-20 DIAGNOSIS — R7989 Other specified abnormal findings of blood chemistry: Secondary | ICD-10-CM

## 2021-09-20 DIAGNOSIS — C50911 Malignant neoplasm of unspecified site of right female breast: Secondary | ICD-10-CM

## 2021-09-20 DIAGNOSIS — N390 Urinary tract infection, site not specified: Secondary | ICD-10-CM

## 2021-09-20 DIAGNOSIS — K219 Gastro-esophageal reflux disease without esophagitis: Secondary | ICD-10-CM

## 2021-09-20 DIAGNOSIS — G8929 Other chronic pain: Secondary | ICD-10-CM

## 2021-09-20 DIAGNOSIS — F419 Anxiety disorder, unspecified: Secondary | ICD-10-CM

## 2021-09-20 MED ORDER — LOSARTAN POTASSIUM 25 MG PO TABS: 25.0000 mg | ORAL_TABLET | Freq: Every day | ORAL | 0 refills | Status: DC

## 2021-09-20 MED ORDER — NITROFURANTOIN MACROCRYSTAL 50 MG PO CAPS: 50.0000 mg | ORAL_CAPSULE | Freq: Every day | ORAL | 3 refills | Status: DC | PRN

## 2021-09-21 NOTE — Progress Notes (Signed)
Location:  [place] [facility] Service:  Clinic (12)  Provider:   Code Status: DNR Goals of Care:  Advanced Directives 09/20/2021  Does Patient Have a Medical Advance Directive? Yes  Type of Advance Directive Out of facility DNR (pink MOST or yellow form);Healthcare Power of Attorney  Does patient want to make changes to medical advance directive? No - Patient declined  Copy of [place] in Chart? Yes - validated most recent copy scanned in chart (See row information)  Would patient like information on creating a medical advance directive? -  Pre-existing out of facility DNR order (yellow form or pink MOST form) Yellow form placed in chart (order not valid for inpatient use)     Chief Complaint  Patient presents with   Medical Management of Chronic Issues    Patient returns to the clinic for 6 month follow up discuss her reoccurring body and head aches with low grade temperature lasting for about 3 days. This is the 4th time its happened.    Quality Metric Gaps    [facility] verified patient is due for #5 covid-19 vaccine and Hep C screening.     HPI: Patient is a 71 y.o. female seen today for medical management of chronic diseases.    Her active issues include Bilateral mastectomy for right breast invasive lobular cancer Follows with Dr. Sonny Dandy and is on tamoxifen since 2019. Plan for 10 years Sees oncology Annually Recurent UTI Using Estrogen cream and uses Macrodantin Prn with Sexual activity Depression and Anxiety Takes Ativan and Pristiq Wants to know if she can taper Ativan Dr Helane Rima prescribes her Continues to have issue with  Anxiety Low back pain  Takes Meloxiam Wears a brace and works with Chiropractor  Hypertension Elevated again today Past Medical History:  Diagnosis Date   Anxiety    Arthritis    "back" (06/05/2018)   Breast cancer, right breast ([place]) 03/2018   Chronic lower back pain    "if I don't do yoga" (06/05/2018)    Depression    Family history of breast cancer    GERD (gastroesophageal reflux disease)    Herpes simplex    on nose   History of hiatal hernia    Macular degeneration    Per [facility] New Patient Packet    Nephritis    "as a child" (06/05/2018)   Osteoporosis    SCC (squamous cell carcinoma)    "under my nose" (06/05/2018)   Strabismus    Per [place] new patient packet    UTI (urinary tract infection) 8/15   Wears glasses     Past Surgical History:  Procedure Laterality Date   BREAST BIOPSY Right 04/2018   CATARACT EXTRACTION W/ INTRAOCULAR LENS  IMPLANT, BILATERAL Bilateral    CESAREAN SECTION  1977   COLONOSCOPY     CYST REMOVAL NECK Left 03/03/2014   Procedure: LEFT NECK CYST EXCISION;  Surgeon: Pedro Earls, MD;  Location: [place];  Service: General;  Laterality: Left;   DILATION AND CURETTAGE OF UTERUS     EYE SURGERY     Strabismus   MASTECTOMY Left 06/05/2018   MASTECTOMY COMPLETE / SIMPLE W/ SENTINEL NODE BIOPSY Right 06/05/2018   AND BLUE DYE INJECTION RIGHT BREAST   MASTECTOMY W/ SENTINEL NODE BIOPSY Bilateral 06/05/2018   Procedure: BILATERAL MASTECTOMIES WITH RIGHT SENTINEL LYMPH NODE BIOPSY AND BLUE DYE INJECTION RIGHT BREAST;  Surgeon: Donnie Mesa, MD;  Location: [place];  Service:  General;  Laterality: Bilateral;   SQUAMOUS CELL CARCINOMA EXCISION     "under my nose"   STRABISMUS SURGERY Bilateral 05/02/2018   Procedure: BILATERAL REPAIR STRABISMUS;  Surgeon: Lamonte Sakai, MD;  Location: [place];  Service: Ophthalmology;  Laterality: Bilateral;   TONSILLECTOMY AND ADENOIDECTOMY     UPPER GI ENDOSCOPY     WISDOM TOOTH EXTRACTION      Allergies  Allergen Reactions   Bactrim [Sulfamethoxazole-Trimethoprim] Anaphylaxis, Hives and Rash    Outpatient Encounter Medications as of 09/20/2021  Medication Sig   amLODipine (NORVASC) 5 MG tablet Take 1 tablet (5 mg total) by mouth daily.   Cholecalciferol (VITAMIN D3) 50 MCG (2000  UT) capsule Take 1 capsule (2,000 Units total) by mouth in the morning and at bedtime.   desvenlafaxine (PRISTIQ) 50 MG 24 hr tablet Take 1 tablet (50 mg total) by mouth daily.   Digestive Enzymes (DIGESTIVE ENZYME PO) Take 1 capsule by mouth 3 (three) times daily.    estradiol (ESTRACE) 0.1 MG/GM vaginal cream Place 1 Applicatorful vaginally 3 (three) times a week.   fluticasone (FLONASE) 50 MCG/ACT nasal spray Place into both nostrils as needed for allergies or rhinitis.   Hypromellose (ARTIFICIAL TEARS OP) Place 1 drop into both eyes daily in the afternoon.   Ketotifen Fumarate (ALAWAY OP) Apply to eye. As needed for allergies   LORazepam (ATIVAN) 0.5 MG tablet TAKE ONE TABLET BY MOUTH AT BEDTIME MAY TAKE EXTRA 1/2 TABLET AS NEEDED   losartan (COZAAR) 25 MG tablet Take 1 tablet (25 mg total) by mouth daily.   Magnesium 200 MG TABS Take 1 tablet by mouth daily.   meloxicam (MOBIC) 15 MG tablet TAKE 1 TABLET ONCE DAILY.   Multiple Vitamins-Minerals (PRESERVISION AREDS) CAPS Take 1 capsule by mouth 2 (two) times daily.    nitrofurantoin (MACRODANTIN) 50 MG capsule Take 1 capsule (50 mg total) by mouth daily as needed.   NON FORMULARY Take 1 capsule by mouth 2 (two) times daily. Bio-Complete 3   NON FORMULARY ASEA REDOX 2 ox drink daily   NON FORMULARY D Mannose 2/day (1300 mg)   NON FORMULARY Symplex F 2 per day strengthen bone   Nutritional Supplements (CYTO-REDOXIN PO) Take 2 oz by mouth in the morning and at bedtime.   Omega-3 Fatty Acids (OMEGA 3 PO) Take 1 capsule by mouth daily.    sodium chloride (OCEAN) 0.65 % SOLN nasal spray Place 1 spray into both nostrils daily as needed for congestion.   tamoxifen (NOLVADEX) 20 MG tablet Take 20 mg by mouth daily.   Turmeric (QC TUMERIC COMPLEX PO) Take 5 mLs by mouth daily.   vitamin E 180 MG (400 UNITS) capsule Take 400 Units by mouth daily.   [DISCONTINUED] tamoxifen (NOLVADEX) 10 MG tablet Take 1 tablet (10 mg total) by mouth 2 (two) times  daily.   [DISCONTINUED] CALCIUM-MAGNESIUM PO Take 1 tablet by mouth daily.    [DISCONTINUED] Magnesium Gluconate 500 (27 Mg) MG TABS Take 500 mg by mouth daily.    [DISCONTINUED] MELATONIN PO Take 1.25 mg by mouth at bedtime as needed.   [DISCONTINUED] Nutritional Supplements (ARTHRO-COMPLEX PO) Take 1 capsule by mouth daily at 6 (six) AM.   [DISCONTINUED] Nutritional Supplements (METABOLIC LIVER FORMULA PO) Take 1 capsule by mouth daily at 6 (six) AM.   [DISCONTINUED] psyllium (METAMUCIL) 58.6 % packet Take 1 packet by mouth daily.   No facility-administered encounter medications on file as of 09/20/2021.    Review of Systems:  Review of Systems  Constitutional:  Negative for activity change and appetite change.  HENT: Negative.    Respiratory:  Negative for cough and shortness of breath.   Cardiovascular:  Negative for leg swelling.  Gastrointestinal:  Negative for constipation.  Genitourinary: Negative.   Musculoskeletal:  Positive for back pain. Negative for arthralgias, gait problem and myalgias.  Skin: Negative.   Neurological:  Negative for dizziness and weakness.  Psychiatric/Behavioral:  Positive for dysphoric mood. Negative for confusion and sleep disturbance. The patient is nervous/anxious.    Health Maintenance  Topic Date Due   Hepatitis C Screening  Never done   COVID-19 Vaccine (5 - Booster for Moderna series) 05/11/2021   TETANUS/TDAP  11/03/2025   COLONOSCOPY (Pts 45-5yr Insurance coverage will need to be confirmed)  12/22/2029   Pneumonia Vaccine 71 Years old  Completed   INFLUENZA VACCINE  Completed   DEXA SCAN  Completed   Zoster Vaccines- Shingrix  Completed   HPV VACCINES  Aged Out   MAMMOGRAM  Discontinued    Physical Exam: Vitals:   09/20/21 1411  BP: (!) 164/94  Pulse: (!) 103  Temp: 97.7 F (36.5 C)  SpO2: 99%  Weight: 139 lb 4.8 oz (63.2 kg)  Height: 5' 4.5" (1.638 m)   Body mass index is 23.54 kg/m. Physical Exam Vitals reviewed.   Constitutional:      Appearance: Normal appearance.  HENT:     Head: Normocephalic.     Nose: Nose normal.     Mouth/Throat:     Mouth: Mucous membranes are moist.     Pharynx: Oropharynx is clear.  Eyes:     Pupils: Pupils are equal, round, and reactive to light.  Cardiovascular:     Rate and Rhythm: Normal rate and regular rhythm.     Pulses: Normal pulses.     Heart sounds: Normal heart sounds. No murmur heard. Pulmonary:     Effort: Pulmonary effort is normal.     Breath sounds: Normal breath sounds.  Abdominal:     General: Abdomen is flat. Bowel sounds are normal.     Palpations: Abdomen is soft.  Musculoskeletal:        General: No swelling.     Cervical back: Neck supple.  Skin:    General: Skin is warm.  Neurological:     General: No focal deficit present.     Mental Status: She is alert and oriented to person, place, and time.  Psychiatric:        Mood and Affect: Mood normal.        Thought Content: Thought content normal.    Labs reviewed: Basic Metabolic Panel: Recent Labs    01/18/21 0919 09/14/21 0810  NA 141 138  K 4.6 4.5  CL 106 104  CO2 26 29  GLUCOSE 81 72  BUN 18 23  CREATININE 0.96* 0.89  CALCIUM 9.4 9.5  TSH 5.32* 5.56*   Liver Function Tests: Recent Labs    01/18/21 0919 09/14/21 0810  AST 15 16  ALT 12 14  BILITOT 0.4 0.5  PROT 6.4 6.5   No results for input(s): LIPASE, AMYLASE in the last 8760 hours. No results for input(s): AMMONIA in the last 8760 hours. CBC: Recent Labs    01/18/21 0919 09/14/21 0810  WBC 5.1 4.8  NEUTROABS 2,193 2,136  HGB 12.8 12.4  HCT 39.0 37.3  MCV 88.8 88.0  PLT 212 221   Lipid Panel: Recent Labs    01/18/21 0919 09/14/21  0810  CHOL 178 185  HDL 72 71  LDLCALC 84 91  TRIG 122 124  CHOLHDL 2.5 2.6   Lab Results  Component Value Date   HGBA1C 5.6 09/14/2021    Procedures since last visit: No results found.  Assessment/Plan 1. Essential hypertension Started on  Cozaar Follow up in BP in 8 weeks 2. Moderate episode of recurrent major depressive disorder (HCC) Would not Taper Ativan Continue Pristiq also  3. Recurrent UTI Does not want to see Urology Continue Estrogen and Macrodantin  4. Chronic bilateral low back pain without sciatica Seeing Chiropractor MRI showing DJD continues to take her meloxicam Add Pepcid for GI protection 5. Osteoporosis without current pathological fracture, unspecified osteoporosis type On Tamoxifen and Vit D  6. Vitamin D deficiency Continue Vit D  7. Invasive lobular carcinoma of breast, stage 1, right ([place]) ON Tamoxifen for 10 years   8. Anxiety Continue Ativan for now  9. Gastroesophageal reflux disease without esophagitis Try Pepcid  10. Elevated TSH Repeat in Few months    Labs/tests ordered:  * No order type specified * Next appt:  11/22/2021

## 2021-09-22 DIAGNOSIS — M9902 Segmental and somatic dysfunction of thoracic region: Secondary | ICD-10-CM | POA: Diagnosis not present

## 2021-09-22 DIAGNOSIS — M5136 Other intervertebral disc degeneration, lumbar region: Secondary | ICD-10-CM | POA: Diagnosis not present

## 2021-09-22 DIAGNOSIS — M9903 Segmental and somatic dysfunction of lumbar region: Secondary | ICD-10-CM | POA: Diagnosis not present

## 2021-09-22 DIAGNOSIS — M9901 Segmental and somatic dysfunction of cervical region: Secondary | ICD-10-CM | POA: Diagnosis not present

## 2021-09-22 DIAGNOSIS — M9904 Segmental and somatic dysfunction of sacral region: Secondary | ICD-10-CM | POA: Diagnosis not present

## 2021-09-26 ENCOUNTER — Other Ambulatory Visit: Payer: Self-pay | Admitting: Hematology and Oncology

## 2021-09-27 ENCOUNTER — Telehealth: Payer: Self-pay | Admitting: Internal Medicine

## 2021-09-27 DIAGNOSIS — R7989 Other specified abnormal findings of blood chemistry: Secondary | ICD-10-CM

## 2021-09-27 NOTE — Telephone Encounter (Signed)
Patient left me the note concerning her Elevated TSH and wants me to do more detail studies ?I will order them. Mickey Farber if you can call her and schedule her for appointment in [place] for Blood Draw. ?

## 2021-09-29 DIAGNOSIS — M9902 Segmental and somatic dysfunction of thoracic region: Secondary | ICD-10-CM | POA: Diagnosis not present

## 2021-09-29 DIAGNOSIS — M5136 Other intervertebral disc degeneration, lumbar region: Secondary | ICD-10-CM | POA: Diagnosis not present

## 2021-09-29 DIAGNOSIS — M9903 Segmental and somatic dysfunction of lumbar region: Secondary | ICD-10-CM | POA: Diagnosis not present

## 2021-09-29 DIAGNOSIS — M9904 Segmental and somatic dysfunction of sacral region: Secondary | ICD-10-CM | POA: Diagnosis not present

## 2021-09-29 DIAGNOSIS — M9901 Segmental and somatic dysfunction of cervical region: Secondary | ICD-10-CM | POA: Diagnosis not present

## 2021-09-29 NOTE — Telephone Encounter (Signed)
Called and DWP. Appointment scheduled with Lattie Haw.  ?

## 2021-10-02 ENCOUNTER — Other Ambulatory Visit: Payer: Medicare PPO

## 2021-10-02 ENCOUNTER — Other Ambulatory Visit: Payer: Self-pay

## 2021-10-02 DIAGNOSIS — R7989 Other specified abnormal findings of blood chemistry: Secondary | ICD-10-CM

## 2021-10-03 LAB — THYROID PANEL WITH TSH
Free Thyroxine Index: 1.9 (ref 1.4–3.8)
T3 Uptake: 26 % (ref 22–35)
T4, Total: 7.3 ug/dL (ref 5.1–11.9)
TSH: 6.06 mIU/L — ABNORMAL HIGH (ref 0.40–4.50)

## 2021-10-03 LAB — THYROID ANTIBODIES
Thyroglobulin Ab: 1 IU/mL (ref <=1)
Thyroperoxidase Ab SerPl-aCnc: 434 IU/mL — ABNORMAL HIGH (ref <9)

## 2021-10-06 DIAGNOSIS — M9903 Segmental and somatic dysfunction of lumbar region: Secondary | ICD-10-CM | POA: Diagnosis not present

## 2021-10-06 DIAGNOSIS — M9902 Segmental and somatic dysfunction of thoracic region: Secondary | ICD-10-CM | POA: Diagnosis not present

## 2021-10-06 DIAGNOSIS — M5136 Other intervertebral disc degeneration, lumbar region: Secondary | ICD-10-CM | POA: Diagnosis not present

## 2021-10-06 DIAGNOSIS — M9901 Segmental and somatic dysfunction of cervical region: Secondary | ICD-10-CM | POA: Diagnosis not present

## 2021-10-06 DIAGNOSIS — M9904 Segmental and somatic dysfunction of sacral region: Secondary | ICD-10-CM | POA: Diagnosis not present

## 2021-10-09 ENCOUNTER — Encounter: Payer: Self-pay | Admitting: Orthopedic Surgery

## 2021-10-09 ENCOUNTER — Other Ambulatory Visit: Payer: Self-pay

## 2021-10-09 ENCOUNTER — Non-Acute Institutional Stay (INDEPENDENT_AMBULATORY_CARE_PROVIDER_SITE_OTHER): Payer: Medicare PPO | Admitting: Orthopedic Surgery

## 2021-10-09 VITALS — BP 142/96 | HR 91 | Temp 96.9°F | Ht 64.0 in | Wt 140.7 lb

## 2021-10-09 DIAGNOSIS — E038 Other specified hypothyroidism: Secondary | ICD-10-CM | POA: Diagnosis not present

## 2021-10-09 DIAGNOSIS — G4709 Other insomnia: Secondary | ICD-10-CM

## 2021-10-09 DIAGNOSIS — I10 Essential (primary) hypertension: Secondary | ICD-10-CM | POA: Diagnosis not present

## 2021-10-09 MED ORDER — LOSARTAN POTASSIUM 50 MG PO TABS: 50.0000 mg | ORAL_TABLET | Freq: Every day | ORAL | 0 refills | Status: DC

## 2021-10-09 MED ORDER — HYDROXYZINE PAMOATE 25 MG PO CAPS: 25.0000 mg | ORAL_CAPSULE | Freq: Two times a day (BID) | ORAL | 0 refills | Status: DC | PRN

## 2021-10-09 NOTE — Patient Instructions (Addendum)
Will increase Losartan to 50 mg ( 2 tablets) daily- new prescription sent in to 50 mg tablet ? ?Endocrinology referral made with [facility] [phone] ? ?Try taking hydroxizine instead of lorazepam to sleep ? ?PLEASE TAKE BLOOD PRESSURE TWICE DAILY ( 1-2 HOURS AFTER TAKING LOSARTAN) BRING RECORDINGS TO CLINIC 10/16/2021 ?

## 2021-10-09 NOTE — Progress Notes (Signed)
? ? ?Careteam: ?Patient Care Team: ?Virgie Dad, MD as PCP - General (Internal Medicine) ?Ronnette Juniper, MD as Consulting Physician (Gastroenterology) ?Phylliss Bob, MD as Consulting Physician (Orthopedic Surgery) ?Nicholas Lose, MD as Consulting Physician (Hematology and Oncology) ?Dian Queen, MD as Consulting Physician (Obstetrics and Gynecology) ?Marygrace Drought, MD as Consulting Physician (Ophthalmology) ?Leta Baptist, MD as Consulting Physician (Otolaryngology) ? ?Seen by: Windell Moulding, AGNP-C ? ?PLACE OF SERVICE:  ?[facility] CLINIC  ?Advanced Directive information ?Does Patient Have a Medical Advance Directive?: Yes, Type of Advance Directive: Out of facility DNR (pink MOST or yellow form);Healthcare Power of Attorney, Pre-existing out of facility DNR order (yellow form or pink MOST form): Yellow form placed in chart (order not valid for inpatient use), Does patient want to make changes to medical advance directive?: No - Patient declined ? ?Allergies  ?Allergen Reactions  ? Bactrim [Sulfamethoxazole-Trimethoprim] Anaphylaxis, Hives and Rash  ? ? ?Chief Complaint  ?Patient presents with  ? Acute Visit  ?  Patient present today to discuss recent labs and check blood pressure  ? ? ? ?HPI: Patient is a 71 y.o. female seen today for medical management of chronic conditions.  ? ?Lab results reviewed with patient. TSH elevated, T4/ T3 normal. Thyroperoxidase antibody elevated. Symptoms include: fatigue, body aches, headache, weight gain, and shaking. Onset about 6 months ago. She has seen Dr. Cruzita Lederer at [facility] in the past- requesting referral to see them today.  ? ?BP elevated last encounter. Dr. Lyndel Safe started her on losartan 25 mg daily. She has been taking new prescription daily. Continues to take blood pressures at home. See below. Denies chest pain, sob, and blurred vision. Admits to some headaches.  ? ?She is still not sleeping well, anxious at night. Does not like the way lorazepam makes her feel.  Would like to try something different. Past history of Paxil use. No recent panic attacks. Remains on Pristiq for depression.  ? ?Recent blood pressures: ?03/09-134/80, 157/89 ?03/19- 178/56, 168/83, 136/86, 162/66 ?03/20- 145/87, 140/87 ?03/21- 170/89 ?03/22- 154/93 ? ?Review of Systems:  ?Review of Systems  ?Constitutional:  Positive for malaise/fatigue and weight loss. Negative for chills and fever.  ?HENT:  Negative for congestion and sore throat.   ?Eyes:  Negative for blurred vision and double vision.  ?Respiratory:  Negative for cough, shortness of breath and wheezing.   ?Cardiovascular:  Negative for chest pain and leg swelling.  ?Gastrointestinal:  Negative for abdominal pain, constipation, diarrhea, heartburn, nausea and vomiting.  ?Genitourinary:  Negative for dysuria.  ?Musculoskeletal:  Positive for back pain. Negative for falls.  ?Neurological:  Positive for headaches. Negative for dizziness and weakness.  ?Psychiatric/Behavioral:  Positive for depression. Negative for memory loss. The patient is nervous/anxious and has insomnia.   ? ?Past Medical History:  ?Diagnosis Date  ? Anxiety   ? Arthritis   ? "back" (06/05/2018)  ? Breast cancer, right breast ([place]) 03/2018  ? Chronic lower back pain   ? "if I don't do yoga" (06/05/2018)  ? Depression   ? Family history of breast cancer   ? GERD (gastroesophageal reflux disease)   ? Herpes simplex   ? on nose  ? History of hiatal hernia   ? Macular degeneration   ? Per [facility] New Patient Packet   ? Nephritis   ? "as a child" (06/05/2018)  ? Osteoporosis   ? SCC (squamous cell carcinoma)   ? "under my nose" (06/05/2018)  ? Strabismus   ? Per [place] new patient packet   ?  UTI (urinary tract infection) 8/15  ? Wears glasses   ? ?Past Surgical History:  ?Procedure Laterality Date  ? BREAST BIOPSY Right 04/2018  ? CATARACT EXTRACTION W/ INTRAOCULAR LENS  IMPLANT, BILATERAL Bilateral   ? CESAREAN SECTION  1977  ? COLONOSCOPY    ? CYST REMOVAL NECK Left 03/03/2014  ?  Procedure: LEFT NECK CYST EXCISION;  Surgeon: Pedro Earls, MD;  Location: [place];  Service: General;  Laterality: Left;  ? DILATION AND CURETTAGE OF UTERUS    ? EYE SURGERY    ? Strabismus  ? MASTECTOMY Left 06/05/2018  ? MASTECTOMY COMPLETE / SIMPLE W/ SENTINEL NODE BIOPSY Right 06/05/2018  ? AND BLUE DYE INJECTION RIGHT BREAST  ? MASTECTOMY W/ SENTINEL NODE BIOPSY Bilateral 06/05/2018  ? Procedure: BILATERAL MASTECTOMIES WITH RIGHT SENTINEL LYMPH NODE BIOPSY AND BLUE DYE INJECTION RIGHT BREAST;  Surgeon: Donnie Mesa, MD;  Location: [place];  Service: General;  Laterality: Bilateral;  ? SQUAMOUS CELL CARCINOMA EXCISION    ? "under my nose"  ? STRABISMUS SURGERY Bilateral 05/02/2018  ? Procedure: BILATERAL REPAIR STRABISMUS;  Surgeon: Lamonte Sakai, MD;  Location: [place];  Service: Ophthalmology;  Laterality: Bilateral;  ? TONSILLECTOMY AND ADENOIDECTOMY    ? UPPER GI ENDOSCOPY    ? WISDOM TOOTH EXTRACTION    ? ?Social History: ?  reports that she quit smoking about 45 years ago. Her smoking use included cigarettes. She has a 0.50 pack-year smoking history. She has never used smokeless tobacco. She reports that she does not currently use alcohol. She reports that she does not use drugs. ? ?Family History  ?Problem Relation Age of Onset  ? Breast cancer Mother 55  ? Hypertension Father   ? Mental illness Sister   ? Osteoporosis Sister   ? Seizures Sister   ? Suicidality Sister   ? Cancer Maternal Grandfather   ?     blood cancer?  ? Breast cancer Cousin   ?     dx under 52  ? Cancer Paternal Aunt   ?     type unk  ? Cancer Other   ?     type unk  ? Cancer Son   ? ? ?Medications: ?Patient's Medications  ?New Prescriptions  ? No medications on file  ?Previous Medications  ? AMLODIPINE (NORVASC) 5 MG TABLET    Take 1 tablet (5 mg total) by mouth daily.  ? CHOLECALCIFEROL (VITAMIN D3) 50 MCG (2000 UT) CAPSULE    Take 1 capsule (2,000 Units total) by mouth in the morning and at  bedtime.  ? DESVENLAFAXINE (PRISTIQ) 50 MG 24 HR TABLET    Take 1 tablet (50 mg total) by mouth daily.  ? DIGESTIVE ENZYMES (DIGESTIVE ENZYME PO)    Take 1 capsule by mouth 3 (three) times daily.   ? ESTRADIOL (ESTRACE) 0.1 MG/GM VAGINAL CREAM    Place 1 Applicatorful vaginally 3 (three) times a week.  ? FLUTICASONE (FLONASE) 50 MCG/ACT NASAL SPRAY    Place into both nostrils as needed for allergies or rhinitis.  ? HYPROMELLOSE (ARTIFICIAL TEARS OP)    Place 1 drop into both eyes daily in the afternoon.  ? KETOTIFEN FUMARATE (ALAWAY OP)    Apply to eye. As needed for allergies  ? LORAZEPAM (ATIVAN) 0.5 MG TABLET    TAKE ONE TABLET BY MOUTH AT BEDTIME MAY TAKE EXTRA 1/2 TABLET AS NEEDED  ? LOSARTAN (COZAAR) 25 MG TABLET    Take 1 tablet (25  mg total) by mouth daily.  ? MAGNESIUM 200 MG TABS    Take 1 tablet by mouth daily.  ? MELOXICAM (MOBIC) 15 MG TABLET    TAKE 1 TABLET ONCE DAILY.  ? MULTIPLE VITAMINS-MINERALS (PRESERVISION AREDS) CAPS    Take 1 capsule by mouth 2 (two) times daily.   ? NITROFURANTOIN (MACRODANTIN) 50 MG CAPSULE    Take 1 capsule (50 mg total) by mouth daily as needed.  ? NON FORMULARY    Take 1 capsule by mouth 2 (two) times daily. Bio-Complete 3  ? NON FORMULARY    ASEA REDOX 2 ox drink daily  ? NON FORMULARY    D Mannose 2/day (1300 mg)  ? NON FORMULARY    Symplex F 2 per day strengthen bone  ? NUTRITIONAL SUPPLEMENTS (CYTO-REDOXIN PO)    Take 2 oz by mouth in the morning and at bedtime.  ? OMEGA-3 FATTY ACIDS (OMEGA 3 PO)    Take 1 capsule by mouth daily.   ? SODIUM CHLORIDE (OCEAN) 0.65 % SOLN NASAL SPRAY    Place 1 spray into both nostrils daily as needed for congestion.  ? TAMOXIFEN (NOLVADEX) 10 MG TABLET    TAKE 1 TABLET BY MOUTH TWICE DAILY.  ? TAMOXIFEN (NOLVADEX) 20 MG TABLET    Take 20 mg by mouth daily.  ? TURMERIC (QC TUMERIC COMPLEX PO)    Take 5 mLs by mouth daily.  ? VITAMIN E 180 MG (400 UNITS) CAPSULE    Take 400 Units by mouth daily.  ?Modified Medications  ? No medications  on file  ?Discontinued Medications  ? No medications on file  ? ? ?Physical Exam: ? ?There were no vitals filed for this visit. ?There is no height or weight on file to calculate BMI. ?Wt Readings from Last

## 2021-10-12 ENCOUNTER — Telehealth: Payer: Self-pay

## 2021-10-12 NOTE — Telephone Encounter (Signed)
Left message for patient to call back to schedule a Medicare AWV with Windell Moulding, NP.  ?

## 2021-10-16 ENCOUNTER — Telehealth: Payer: Self-pay | Admitting: Orthopedic Surgery

## 2021-10-16 DIAGNOSIS — M5136 Other intervertebral disc degeneration, lumbar region: Secondary | ICD-10-CM | POA: Diagnosis not present

## 2021-10-16 DIAGNOSIS — M9901 Segmental and somatic dysfunction of cervical region: Secondary | ICD-10-CM | POA: Diagnosis not present

## 2021-10-16 DIAGNOSIS — M9902 Segmental and somatic dysfunction of thoracic region: Secondary | ICD-10-CM | POA: Diagnosis not present

## 2021-10-16 DIAGNOSIS — M9903 Segmental and somatic dysfunction of lumbar region: Secondary | ICD-10-CM | POA: Diagnosis not present

## 2021-10-16 DIAGNOSIS — M9904 Segmental and somatic dysfunction of sacral region: Secondary | ICD-10-CM | POA: Diagnosis not present

## 2021-10-16 NOTE — Telephone Encounter (Signed)
New blood pressure readings provided by patient are as follows: ?03/28: 148/88, 03/29: 156/90, 03/30: 138/81, 03/31: 141/79. Advised to continue losartan 50 mg daily. She is not interested in [facility], requesting [facility] provider. Advised to call [facility] for assistance.  ?

## 2021-10-27 DIAGNOSIS — M9904 Segmental and somatic dysfunction of sacral region: Secondary | ICD-10-CM | POA: Diagnosis not present

## 2021-10-27 DIAGNOSIS — M9902 Segmental and somatic dysfunction of thoracic region: Secondary | ICD-10-CM | POA: Diagnosis not present

## 2021-10-27 DIAGNOSIS — M9903 Segmental and somatic dysfunction of lumbar region: Secondary | ICD-10-CM | POA: Diagnosis not present

## 2021-10-27 DIAGNOSIS — M9901 Segmental and somatic dysfunction of cervical region: Secondary | ICD-10-CM | POA: Diagnosis not present

## 2021-10-27 DIAGNOSIS — M5136 Other intervertebral disc degeneration, lumbar region: Secondary | ICD-10-CM | POA: Diagnosis not present

## 2021-11-13 ENCOUNTER — Other Ambulatory Visit: Payer: Self-pay | Admitting: Hematology and Oncology

## 2021-11-13 DIAGNOSIS — M9903 Segmental and somatic dysfunction of lumbar region: Secondary | ICD-10-CM | POA: Diagnosis not present

## 2021-11-13 DIAGNOSIS — M9904 Segmental and somatic dysfunction of sacral region: Secondary | ICD-10-CM | POA: Diagnosis not present

## 2021-11-13 DIAGNOSIS — M5136 Other intervertebral disc degeneration, lumbar region: Secondary | ICD-10-CM | POA: Diagnosis not present

## 2021-11-13 DIAGNOSIS — M9902 Segmental and somatic dysfunction of thoracic region: Secondary | ICD-10-CM | POA: Diagnosis not present

## 2021-11-13 DIAGNOSIS — M9901 Segmental and somatic dysfunction of cervical region: Secondary | ICD-10-CM | POA: Diagnosis not present

## 2021-11-16 ENCOUNTER — Ambulatory Visit: Payer: Medicare PPO | Admitting: Internal Medicine

## 2021-11-16 ENCOUNTER — Encounter: Payer: Self-pay | Admitting: Internal Medicine

## 2021-11-16 VITALS — BP 148/88 | HR 94 | Ht 64.0 in | Wt 140.8 lb

## 2021-11-16 DIAGNOSIS — E063 Autoimmune thyroiditis: Secondary | ICD-10-CM

## 2021-11-16 DIAGNOSIS — M810 Age-related osteoporosis without current pathological fracture: Secondary | ICD-10-CM | POA: Diagnosis not present

## 2021-11-16 DIAGNOSIS — E559 Vitamin D deficiency, unspecified: Secondary | ICD-10-CM | POA: Diagnosis not present

## 2021-11-16 DIAGNOSIS — E038 Other specified hypothyroidism: Secondary | ICD-10-CM | POA: Diagnosis not present

## 2021-11-16 LAB — TSH: TSH: 5.27 u[IU]/mL (ref 0.35–5.50)

## 2021-11-16 LAB — VITAMIN D 25 HYDROXY (VIT D DEFICIENCY, FRACTURES): VITD: 40.61 ng/mL (ref 30.00–100.00)

## 2021-11-16 LAB — T3, FREE: T3, Free: 2.4 pg/mL (ref 2.3–4.2)

## 2021-11-16 LAB — T4, FREE: Free T4: 0.63 ng/dL (ref 0.60–1.60)

## 2021-11-16 NOTE — Progress Notes (Signed)
Patient ID: Lisa Yoder, female   DOB: [birth year], 71 y.o.   MRN: [id number] ? ?This visit occurred during the SARS-CoV-2 public health emergency.  Safety protocols were in place, including screening questions prior to the visit, additional usage of staff PPE, and extensive cleaning of exam room while observing appropriate contact time as indicated for disinfecting solutions.  ? ?HPI  ?Lisa Yoder is a 71 y.o.-year-old female, returning for for new diagnosis of Hashimoto's hypothyroidism.  I previously saw her for osteoporosis, with the last visit being virtual ,1 year and 6 months ago. ?ObGyn: Dr. Dian Queen. ? ?Interim history: ?In the last year, she was found to have higher TSH levels.  Thyroid antibodies are positive, giving her a diagnosis of Hashimoto's thyroiditis. ?Patient mentions fatigue, headache, weight gain, tremors, generalized aches and pains, brain fog.  She is taking turmeric. ?She has R SI pain - has a belt. Tried yoga, massage, PT, chiropractor.  She will see orthopedics tomorrow. ? ?Reviewed and addended history: ?Osteoporosis: ?- dx'ed in 2006. ? ?No recent falls or fractures.she has a history of a fall on the deck in 2018, but no fractures.  She denies dizziness, vertigo, orthostasis. ? ?Reviewed patient's DXA scan reports: ? DXA 03/16/2020 ([facility]) Lumbar spine L1-L4(L3) Femoral neck (FN)  ?T-score   -2.7 RFN: -2.9 ?LFN: -2.9  ?Change in BMD from previous DXA test (%) Up 9.3%* Up 1.3%  ?(*) statistically significant ? ?Previously: ?Date L1-L4 T score FN T score  ?05/28/2017 ([facility]) (L3): -3.3 (-0.9%) LFN: -2.8 (-2.2%*) ?RFN: -3.1  ?05/27/2015 ?() -3.0 LFN: -2.7 ?RFN: -3.1  ?05/25/2013 ?([facility]) -3.5 (-10.4%* c/w 03/14/2009) LFN: -3.4 ?RFN: -3.5  ?03/14/2009 -2.8   ?03/03/2007 -2.7   ?04/13/2005 -2.5   ? ?She was on the following osteoporosis treatments: ?- Fosamax in 2006 >> for 1-2 years ?- Actonel >> for 2 more years.  ?She refused Prolia until now. ? ?+  history of vitamin D deficiency.  Reviewed recent vitamin D levels: ?Lab Results  ?Component Value Date  ? VD25OH 43 10/02/2019  ? VD25OH 31 05/20/2019  ? VD25OH 37.68 10/22/2017  ? VD25OH 31.59 08/28/2016  ? VD25OH 43.57 09/02/2015  ? VD25OH 42 10/08/2014  ?In the past, she was on 5000 units vitamin D daily, but as vitamin D level was close to the lower limit of normal, I advised her to increase to 7000 units daily >> then 2000 units daily and then we increased to 4000 units daily. This is now followed by Dr. Lyndel Safe >> dose decreased to 2000 IU daily. This is managed by Dr. Lyndel Safe. ? ?She does weightbearing exercises. ? ?She is not on high vitamin A doses. ? ?No history of sustained hypo or hypercalcemia.  No hyperparathyroidism.  No kidney stones. ?Lab Results  ?Component Value Date  ? CALCIUM 9.5 09/14/2021  ? CALCIUM 9.4 01/18/2021  ? CALCIUM 9.5 06/15/2020  ? CALCIUM 10.0 01/28/2020  ? CALCIUM 9.2 10/02/2019  ? CALCIUM 9.8 05/20/2019  ? CALCIUM 9.2 06/06/2018  ? CALCIUM 9.3 05/28/2018  ? CALCIUM 9.6 10/22/2017  ? CALCIUM 10.1 08/28/2016  ? ?No history of CKD.  Latest creatinine was higher: ?Lab Results  ?Component Value Date  ? BUN 23 09/14/2021  ? CREATININE 0.89 09/14/2021  ? ?Menopause was at 71 years old. ? ?She does have a family history of osteoporosis in her sister, who is on Forteo. ? ?Hashimoto's thyroiditis: ?-Recent diagnosis: ?Lab Results  ?Component Value Date  ? TSH 6.06 (H) 10/02/2021  ?  TSH 5.56 (H) 09/14/2021  ? TSH 5.32 (H) 01/18/2021  ? TSH 4.43 05/20/2019  ? TSH 2.471 10/08/2014  ? ?No results found for: FREET4, T3FREE ?Total T4 level was normal on 10/02/2021. ? ?Pt denies: ?- feeling nodules in neck ?- hoarseness ?- dysphagia ?- choking ?- SOB with lying down  ?She has dysphagia with dry food - chronic. ? ?No FH of thyroid disease or thyroid cancer. No h/o radiation tx to head or neck. ? ?No seaweed or kelp. No recent contrast studies. No herbal supplements. No Biotin use. No recent steroids  use.  ? ?She also has a history of a small hiatal hernia. ? ?She resides in the [place] community.  ? ?ROS: ?+ see HPI ?+ fatigue+ dysphagia ? ?I reviewed pt's medications, allergies, PMH, social hx, family hx, and changes were documented in the history of present illness. Otherwise, unchanged from my initial visit note. ? ?Past Medical History:  ?Diagnosis Date  ? Anxiety   ? Arthritis   ? "back" (06/05/2018)  ? Breast cancer, right breast ([place]) 03/2018  ? Chronic lower back pain   ? "if I don't do yoga" (06/05/2018)  ? Depression   ? Family history of breast cancer   ? GERD (gastroesophageal reflux disease)   ? Herpes simplex   ? on nose  ? History of hiatal hernia   ? Macular degeneration   ? Per [facility] New Patient Packet   ? Nephritis   ? "as a child" (06/05/2018)  ? Osteoporosis   ? SCC (squamous cell carcinoma)   ? "under my nose" (06/05/2018)  ? Strabismus   ? Per [facility] new patient packet   ? UTI (urinary tract infection) 8/15  ? Wears glasses   ? ?Past Surgical History:  ?Procedure Laterality Date  ? BREAST BIOPSY Right 04/2018  ? CATARACT EXTRACTION W/ INTRAOCULAR LENS  IMPLANT, BILATERAL Bilateral   ? CESAREAN SECTION  1977  ? COLONOSCOPY    ? CYST REMOVAL NECK Left 03/03/2014  ? Procedure: LEFT NECK CYST EXCISION;  Surgeon: Pedro Earls, MD;  Location: [place];  Service: General;  Laterality: Left;  ? DILATION AND CURETTAGE OF UTERUS    ? EYE SURGERY    ? Strabismus  ? MASTECTOMY Left 06/05/2018  ? MASTECTOMY COMPLETE / SIMPLE W/ SENTINEL NODE BIOPSY Right 06/05/2018  ? AND BLUE DYE INJECTION RIGHT BREAST  ? MASTECTOMY W/ SENTINEL NODE BIOPSY Bilateral 06/05/2018  ? Procedure: BILATERAL MASTECTOMIES WITH RIGHT SENTINEL LYMPH NODE BIOPSY AND BLUE DYE INJECTION RIGHT BREAST;  Surgeon: Donnie Mesa, MD;  Location: [place];  Service: General;  Laterality: Bilateral;  ? SQUAMOUS CELL CARCINOMA EXCISION    ? "under my nose"  ? STRABISMUS SURGERY Bilateral 05/02/2018  ?  Procedure: BILATERAL REPAIR STRABISMUS;  Surgeon: Lamonte Sakai, MD;  Location: [place];  Service: Ophthalmology;  Laterality: Bilateral;  ? TONSILLECTOMY AND ADENOIDECTOMY    ? UPPER GI ENDOSCOPY    ? WISDOM TOOTH EXTRACTION    ? ?History  ? ?Social History  ? Marital Status: Married  ?  Spouse Name: N/A  ? Number of Children: 2  ? 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 ?Social History Main Topics  ? Smoking status: Former Smoker  ?  Types: Cigarettes  ?  Quit date:  50   ? Smokeless tobacco: Never Used  ? Alcohol Use: Yes  ? Drug Use: No  ? ?Current Outpatient Medications on File Prior  to Visit  ?Medication Sig Dispense Refill  ? Cholecalciferol (VITAMIN D3) 50 MCG (2000 UT) capsule Take 1 capsule (2,000 Units total) by mouth in the morning and at bedtime.    ? desvenlafaxine (PRISTIQ) 50 MG 24 hr tablet Take 1 tablet (50 mg total) by mouth daily. 30 tablet 3  ? Digestive Enzymes (DIGESTIVE ENZYME PO) Take 1 capsule by mouth 3 (three) times daily.     ? estradiol (ESTRACE) 0.1 MG/GM vaginal cream Place 1 Applicatorful vaginally 3 (three) times a week.    ? fluticasone (FLONASE) 50 MCG/ACT nasal spray Place into both nostrils as needed for allergies or rhinitis.    ? hydrOXYzine (VISTARIL) 25 MG capsule Take 1 capsule (25 mg total) by mouth 2 (two) times daily as needed. 30 capsule 0  ? Hypromellose (ARTIFICIAL TEARS OP) Place 1 drop into both eyes daily in the afternoon.    ? Ketotifen Fumarate (ALAWAY OP) Apply to eye. As needed for allergies    ? LORazepam (ATIVAN) 0.5 MG tablet TAKE ONE TABLET BY MOUTH AT BEDTIME MAY TAKE EXTRA 1/2 TABLET AS NEEDED 45 tablet 5  ? losartan (COZAAR) 50 MG tablet Take 1 tablet (50 mg total) by mouth daily. 90 tablet 0  ? Magnesium 200 MG TABS Take 1 tablet by mouth daily.    ? meloxicam (MOBIC) 15 MG tablet TAKE 1 TABLET ONCE DAILY. 30 tablet 5  ? Multiple Vitamins-Minerals (PRESERVISION AREDS) CAPS Take 1 capsule by mouth 2 (two) times daily.      ? nitrofurantoin (MACRODANTIN) 50 MG capsule Take 1 capsule (50 mg total) by mouth daily as needed. 30 capsule 3  ? NON FORMULARY Take 1 capsule by mouth 2 (two) times daily. Bio-Complete 3    ? NON FORMUL

## 2021-11-16 NOTE — Patient Instructions (Addendum)
Please contact [place] to schedule the bone density - [phone]. ? ?Please stop at the lab. ? ?Please come back for a follow-up appointment in 6 months. ? ? ?

## 2021-11-17 ENCOUNTER — Other Ambulatory Visit: Payer: Self-pay | Admitting: Orthopedic Surgery

## 2021-11-17 DIAGNOSIS — M5451 Vertebrogenic low back pain: Secondary | ICD-10-CM | POA: Diagnosis not present

## 2021-11-17 DIAGNOSIS — M545 Low back pain, unspecified: Secondary | ICD-10-CM

## 2021-11-17 DIAGNOSIS — M533 Sacrococcygeal disorders, not elsewhere classified: Secondary | ICD-10-CM | POA: Diagnosis not present

## 2021-11-22 ENCOUNTER — Encounter: Payer: Self-pay | Admitting: Internal Medicine

## 2021-11-22 ENCOUNTER — Non-Acute Institutional Stay: Payer: Medicare PPO | Admitting: Internal Medicine

## 2021-11-22 VITALS — BP 159/84 | HR 86 | Temp 98.0°F | Ht 64.0 in | Wt 141.6 lb

## 2021-11-22 DIAGNOSIS — G4709 Other insomnia: Secondary | ICD-10-CM | POA: Diagnosis not present

## 2021-11-22 DIAGNOSIS — F331 Major depressive disorder, recurrent, moderate: Secondary | ICD-10-CM

## 2021-11-22 DIAGNOSIS — I10 Essential (primary) hypertension: Secondary | ICD-10-CM

## 2021-11-22 DIAGNOSIS — N390 Urinary tract infection, site not specified: Secondary | ICD-10-CM

## 2021-11-22 DIAGNOSIS — M533 Sacrococcygeal disorders, not elsewhere classified: Secondary | ICD-10-CM

## 2021-11-22 DIAGNOSIS — M810 Age-related osteoporosis without current pathological fracture: Secondary | ICD-10-CM

## 2021-11-22 DIAGNOSIS — F419 Anxiety disorder, unspecified: Secondary | ICD-10-CM

## 2021-11-22 DIAGNOSIS — M545 Low back pain, unspecified: Secondary | ICD-10-CM

## 2021-11-22 DIAGNOSIS — G8929 Other chronic pain: Secondary | ICD-10-CM

## 2021-11-22 DIAGNOSIS — E038 Other specified hypothyroidism: Secondary | ICD-10-CM

## 2021-11-22 MED ORDER — LORAZEPAM 0.5 MG PO TABS: ORAL_TABLET | ORAL | 0 refills | Status: DC

## 2021-11-22 MED ORDER — LOSARTAN POTASSIUM 25 MG PO TABS: 25.0000 mg | ORAL_TABLET | Freq: Every evening | ORAL | 3 refills | Status: DC

## 2021-11-25 NOTE — Progress Notes (Signed)
? ?Location:  [place] ?  ?Place of Service:  Clinic (12) ? ?Provider:  ? ?Code Status:  ?Goals of Care:  ? ?  11/22/2021  ?  3:02 PM  ?Advanced Directives  ?Does Patient Have a Medical Advance Directive? Yes  ?Type of Advance Directive Out of facility DNR (pink MOST or yellow form);Healthcare Power of Attorney  ?Does patient want to make changes to medical advance directive? No - Patient declined  ?Copy of [place] in Chart? Yes - validated most recent copy scanned in chart (See row information)  ?Pre-existing out of facility DNR order (yellow form or pink MOST form) Yellow form placed in chart (order not valid for inpatient use)  ? ? ? ?Chief Complaint  ?Patient presents with  ? Medical Management of Chronic Issues  ?  Patient returns to the clinic for her 2 month follow up.   ? Quality Metric Gaps  ?  Hep C screen  ? ? ?HPI: Patient is a 71 y.o. female seen today for medical management of chronic diseases.   ?Her active issues include ?Bilateral mastectomy for right breast invasive lobular cancer ?Follows with Dr. Sonny Dandy and is on tamoxifen since 2019. 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 Anxiety   ? Arthritis   ? "back" (06/05/2018)  ? Breast cancer, right breast ([place]) 03/2018  ? Chronic lower back pain   ? "if I don't do yoga" (06/05/2018)  ? Depression   ? Family history of breast cancer   ? GERD (gastroesophageal reflux disease)   ? Herpes simplex   ? on nose  ? History of hiatal hernia   ? Macular degeneration   ? Per [facility] New Patient Packet   ? Nephritis   ? "as a child" (06/05/2018)  ?  Osteoporosis   ? SCC (squamous cell carcinoma)   ? "under my nose" (06/05/2018)  ? Strabismus   ? Per [facility] new patient packet   ? UTI (urinary tract infection) 8/15  ? Wears glasses   ? ? ?Past Surgical History:  ?Procedure Laterality Date  ? BREAST BIOPSY Right 04/2018  ? CATARACT EXTRACTION W/ INTRAOCULAR LENS  IMPLANT, BILATERAL Bilateral   ? CESAREAN SECTION  1977  ? COLONOSCOPY    ? CYST REMOVAL NECK Left 03/03/2014  ? Procedure: LEFT NECK CYST EXCISION;  Surgeon: Pedro Earls, MD;  Location: [place];  Service: General;  Laterality: Left;  ? DILATION AND CURETTAGE OF UTERUS    ? EYE SURGERY    ? Strabismus  ? MASTECTOMY Left 06/05/2018  ? MASTECTOMY COMPLETE / SIMPLE W/ SENTINEL NODE BIOPSY Right 06/05/2018  ? AND BLUE DYE INJECTION RIGHT BREAST  ? MASTECTOMY W/ SENTINEL NODE BIOPSY Bilateral 06/05/2018  ? Procedure: BILATERAL MASTECTOMIES WITH RIGHT SENTINEL LYMPH NODE BIOPSY AND BLUE DYE INJECTION RIGHT BREAST;  Surgeon: Donnie Mesa, MD;  Location: [place];  Service: General;  Laterality: Bilateral;  ? SQUAMOUS CELL CARCINOMA EXCISION    ? "under my nose"  ? STRABISMUS SURGERY Bilateral 05/02/2018  ? Procedure: BILATERAL REPAIR STRABISMUS;  Surgeon: Lamonte Sakai, MD;  Location: [place];  Service: Ophthalmology;  Laterality: Bilateral;  ? TONSILLECTOMY AND ADENOIDECTOMY    ? UPPER GI ENDOSCOPY    ? WISDOM TOOTH EXTRACTION    ? ? ?Allergies  ?Allergen Reactions  ? Bactrim [Sulfamethoxazole-Trimethoprim] Anaphylaxis, Hives and Rash  ? ? ?Outpatient Encounter Medications as of 11/22/2021  ?Medication Sig  ? Cholecalciferol (VITAMIN D3) 50 MCG (2000 UT) capsule Take 1 capsule (2,000 Units total) by mouth in the morning and at bedtime.  ? desvenlafaxine (PRISTIQ) 50 MG 24 hr tablet Take 1 tablet (50 mg total) by mouth daily.  ? Digestive Enzymes (DIGESTIVE ENZYME PO) Take 1 capsule by mouth 3 (three) times daily.   ? estradiol (ESTRACE) 0.1 MG/GM vaginal cream Place 1  Applicatorful vaginally 3 (three) times a week.  ? fluticasone (FLONASE) 50 MCG/ACT nasal spray Place into both nostrils as needed for allergies or rhinitis.  ? Hypromellose (ARTIFICIAL TEARS OP) Place 1 drop into both eyes daily in the afternoon.  ? LORazepam (ATIVAN) 0.5 MG tablet TAKE ONE TABLET BY MOUTH AT BEDTIME MAY TAKE EXTRA 1/2 TABLET AS NEEDED  ? losartan (COZAAR) 50 MG tablet Take 1 tablet (50 mg total) by mouth daily.  ? Magnesium 200 MG TABS Take 1 tablet by mouth daily.  ? Multiple Vitamins-Minerals (PRESERVISION AREDS) CAPS Take 1 capsule by mouth 2 (two) times daily.   ? NON FORMULARY Take 1 capsule by mouth 2 (two) times daily. Bio-Complete 3  ? NON FORMULARY ASEA REDOX 2 ox drink daily  ? NON FORMULARY D Mannose 2/day (1300 mg)  ? NON FORMULARY Symplex F 2 per day strengthen bone  ? Nutritional Supplements (CYTO-REDOXIN PO) Take 2 oz by mouth in the morning and at bedtime.  ? Omega-3 Fatty Acids (OMEGA 3 PO) Take 1 capsule by mouth daily.   ? sodium chloride (OCEAN) 0.65 % SOLN nasal spray Place 1 spray into both nostrils daily as needed for congestion.  ? tamoxifen (NOLVADEX) 10 MG tablet TAKE 1 TABLET BY MOUTH TWICE DAILY.  ? Turmeric (QC TUMERIC COMPLEX PO) Take 5 mLs by mouth daily.  ? vitamin E 180 MG (400 UNITS) capsule Take 400 Units by mouth daily.  ? [DISCONTINUED] LORazepam (ATIVAN) 0.5 MG tablet TAKE ONE TABLET BY MOUTH AT BEDTIME MAY TAKE EXTRA 1/2 TABLET AS NEEDED  ? [DISCONTINUED] losartan (COZAAR) 25 MG tablet Take 25 mg by mouth every evening.  ? losartan (COZAAR) 25 MG tablet Take 1 tablet (25 mg total) by mouth every evening. (Patient not taking: Reported on 11/22/2021)  ? meloxicam (MOBIC) 15 MG tablet TAKE 1 TABLET ONCE DAILY.  ? nitrofurantoin (MACRODANTIN) 50 MG capsule Take 1 capsule (50 mg total) by mouth daily as needed.  ? ?No facility-administered encounter medications on file as of 11/22/2021.  ? ? ?Review of Systems:  ?Review of Systems  ?Constitutional:  Negative for  activity change and appetite change.  ?HENT: Negative.    ?Respiratory:  Negative for cough and shortness of breath.   ?Cardiovascular:  Negative for leg swelling.  ?Gastrointestinal:  Negative for constipation.  ?Genitourinary: Negative.   ?Musculoskeletal:  Positive for arthralgias, back pain, gait problem and myalgias.  ?Skin: Negative.   ?Neurological:  Negative for dizziness and weakness.  ?Psychiatric/Behavioral:  Negative for confusion, dysphoric mood and sleep disturbance. The patient is nervous/anxious.   ? ?Health Maintenance  ?Topic Date Due  ? Hepatitis C Screening  Never done  ? COVID-19 Vaccine (5 - Booster for Moderna series) 06/06/2022 (Originally 05/31/2021)  ? INFLUENZA VACCINE  02/13/2022  ?  TETANUS/TDAP  11/03/2025  ? COLONOSCOPY (Pts 45-103yr Insurance coverage will need to be confirmed)  12/22/2029  ? Pneumonia Vaccine 71 Years old  Completed  ? DEXA SCAN  Completed  ? Zoster Vaccines- Shingrix  Completed  ? HPV VACCINES  Aged Out  ? MAMMOGRAM  Discontinued  ? ? ?Physical Exam: ?Vitals:  ? 11/22/21 1510  ?BP: (!) 159/84  ?Pulse: 86  ?Temp: 98 ?F (36.7 ?C)  ?SpO2: 97%  ?Weight: 141 lb 9.6 oz (64.2 kg)  ?Height: '5\' 4"'$  (1.626 m)  ? ?Body mass index is 24.31 kg/m?.Marland Kitchen?Physical Exam ?Vitals reviewed.  ?Constitutional:   ?   Appearance: Normal appearance.  ?HENT:  ?   Head: Normocephalic.  ?   Nose: Nose normal.  ?   Mouth/Throat:  ?   Mouth: Mucous membranes are moist.  ?   Pharynx: Oropharynx is clear.  ?Eyes:  ?   Pupils: Pupils are equal, round, and reactive to light.  ?Cardiovascular:  ?   Rate and Rhythm: Normal rate and regular rhythm.  ?   Pulses: Normal pulses.  ?   Heart sounds: Normal heart sounds. No murmur heard. ?Pulmonary:  ?   Effort: Pulmonary effort is normal.  ?   Breath sounds: Normal breath sounds.  ?Abdominal:  ?   General: Abdomen is flat. Bowel sounds are normal.  ?   Palpations: Abdomen is soft.  ?Musculoskeletal:     ?   General: No swelling.  ?   Cervical back: Neck supple.   ?Skin: ?   General: Skin is warm.  ?Neurological:  ?   General: No focal deficit present.  ?   Mental Status: She is alert and oriented to person, place, and time.  ?Psychiatric:     ?   Mood and Affect: Mood

## 2021-11-27 DIAGNOSIS — M9904 Segmental and somatic dysfunction of sacral region: Secondary | ICD-10-CM | POA: Diagnosis not present

## 2021-11-27 DIAGNOSIS — M5136 Other intervertebral disc degeneration, lumbar region: Secondary | ICD-10-CM | POA: Diagnosis not present

## 2021-11-27 DIAGNOSIS — M9903 Segmental and somatic dysfunction of lumbar region: Secondary | ICD-10-CM | POA: Diagnosis not present

## 2021-11-27 DIAGNOSIS — M9901 Segmental and somatic dysfunction of cervical region: Secondary | ICD-10-CM | POA: Diagnosis not present

## 2021-11-27 DIAGNOSIS — M9902 Segmental and somatic dysfunction of thoracic region: Secondary | ICD-10-CM | POA: Diagnosis not present

## 2021-12-04 ENCOUNTER — Telehealth: Payer: Self-pay | Admitting: Hematology and Oncology

## 2021-12-04 NOTE — Telephone Encounter (Signed)
Rescheduled appointment per provider request. Left message.

## 2021-12-13 DIAGNOSIS — M9901 Segmental and somatic dysfunction of cervical region: Secondary | ICD-10-CM | POA: Diagnosis not present

## 2021-12-13 DIAGNOSIS — M5136 Other intervertebral disc degeneration, lumbar region: Secondary | ICD-10-CM | POA: Diagnosis not present

## 2021-12-13 DIAGNOSIS — M9903 Segmental and somatic dysfunction of lumbar region: Secondary | ICD-10-CM | POA: Diagnosis not present

## 2021-12-13 DIAGNOSIS — M9904 Segmental and somatic dysfunction of sacral region: Secondary | ICD-10-CM | POA: Diagnosis not present

## 2021-12-13 DIAGNOSIS — M9902 Segmental and somatic dysfunction of thoracic region: Secondary | ICD-10-CM | POA: Diagnosis not present

## 2021-12-15 ENCOUNTER — Ambulatory Visit
Admission: RE | Admit: 2021-12-15 | Discharge: 2021-12-15 | Disposition: A | Discharge status: Home / Self Care | Payer: Medicare PPO | Source: Ambulatory Visit | Attending: Orthopedic Surgery | Admitting: Orthopedic Surgery

## 2021-12-15 DIAGNOSIS — M5416 Radiculopathy, lumbar region: Secondary | ICD-10-CM | POA: Diagnosis not present

## 2021-12-15 DIAGNOSIS — G8929 Other chronic pain: Secondary | ICD-10-CM

## 2021-12-15 DIAGNOSIS — M461 Sacroiliitis, not elsewhere classified: Secondary | ICD-10-CM | POA: Diagnosis not present

## 2021-12-15 DIAGNOSIS — Z853 Personal history of malignant neoplasm of breast: Secondary | ICD-10-CM | POA: Diagnosis not present

## 2021-12-22 DIAGNOSIS — R300 Dysuria: Secondary | ICD-10-CM | POA: Diagnosis not present

## 2021-12-22 DIAGNOSIS — Z113 Encounter for screening for infections with a predominantly sexual mode of transmission: Secondary | ICD-10-CM | POA: Diagnosis not present

## 2021-12-22 DIAGNOSIS — R350 Frequency of micturition: Secondary | ICD-10-CM | POA: Diagnosis not present

## 2021-12-22 DIAGNOSIS — N760 Acute vaginitis: Secondary | ICD-10-CM | POA: Diagnosis not present

## 2021-12-26 NOTE — Progress Notes (Incomplete)
Patient Care Team: Virgie Dad, MD as PCP - General (Internal Medicine) Ronnette Juniper, MD as Consulting Physician (Gastroenterology) Phylliss Bob, MD as Consulting Physician (Orthopedic Surgery) Nicholas Lose, MD as Consulting Physician (Hematology and Oncology) Dian Queen, MD as Consulting Physician (Obstetrics and Gynecology) Marygrace Drought, MD as Consulting Physician (Ophthalmology) Leta Baptist, MD as Consulting Physician (Otolaryngology)  DIAGNOSIS: No diagnosis found.  SUMMARY OF ONCOLOGIC HISTORY: Oncology History  Malignant neoplasm of upper-outer quadrant of right breast in female, estrogen receptor positive ([place])  05/05/2018 Initial Diagnosis   Screening detected right breast mass, MRI revealed UIQ 1.5 x 0.8 x 0.8 cm mass.  Additional masses 5 mm size and 7 mm size and several smaller enhancing masses in the right breast, biopsy of the 1.5 cm mass invasive lobular cancer with LCIS ER 20%, PR 20%, Ki-67 less than 1%, HER-2 negative; biopsy of additional masses showed ALH and ILC with LCIS ER 100%, PR 70%, Ki-67 5%, HER-2 1+ negative   05/08/2018 -  Anti-estrogen oral therapy   Tamoxifen, plan 5-10 years   06/05/2018 Surgery   Right mastectomy: Invasive lobular carcinoma, multifocal, 1.2 cm is the largest tumor, grade 1, margins negative, 0/1 lymph node, ER 100%, PR 70%, HER-2 -1+, Ki-67 5% T1c N0 stage Ia Left mastectomy: Benign   06/18/2018 Cancer Staging   Staging form: Breast, AJCC 8th Edition - Pathologic: Stage IA (pT1c(m), pN0, cM0, G1, ER+, PR+, HER2-) - Signed by Gardenia Phlegm, NP on 06/18/2018   06/19/2018 Oncotype testing   Oncotype: score of 11 with 3% chance of distant recurrence in 9 years with tamoxifen alone     CHIEF COMPLIANT: Follow-up of right breast cancer on tamoxifen    INTERVAL HISTORY: Lisa Yoder is a 71 y.o. with above-mentioned history of right breast cancer. She presents to the clinic today for a follow-up.     ALLERGIES:  is allergic to bactrim [sulfamethoxazole-trimethoprim].  MEDICATIONS:  Current Outpatient Medications  Medication Sig Dispense Refill   Cholecalciferol (VITAMIN D3) 50 MCG (2000 UT) capsule Take 1 capsule (2,000 Units total) by mouth in the morning and at bedtime.     desvenlafaxine (PRISTIQ) 50 MG 24 hr tablet Take 1 tablet (50 mg total) by mouth daily. 30 tablet 3   Digestive Enzymes (DIGESTIVE ENZYME PO) Take 1 capsule by mouth 3 (three) times daily.      estradiol (ESTRACE) 0.1 MG/GM vaginal cream Place 1 Applicatorful vaginally 3 (three) times a week.     fluticasone (FLONASE) 50 MCG/ACT nasal spray Place into both nostrils as needed for allergies or rhinitis.     Hypromellose (ARTIFICIAL TEARS OP) Place 1 drop into both eyes daily in the afternoon.     LORazepam (ATIVAN) 0.5 MG tablet TAKE ONE TABLET BY MOUTH AT BEDTIME MAY TAKE EXTRA 1/2 TABLET AS NEEDED 45 tablet 0   losartan (COZAAR) 25 MG tablet Take 1 tablet (25 mg total) by mouth every evening. (Patient not taking: Reported on 11/22/2021) 90 tablet 3   losartan (COZAAR) 50 MG tablet Take 1 tablet (50 mg total) by mouth daily. 90 tablet 0   Magnesium 200 MG TABS Take 1 tablet by mouth daily.     meloxicam (MOBIC) 15 MG tablet TAKE 1 TABLET ONCE DAILY. 30 tablet 5   Multiple Vitamins-Minerals (PRESERVISION AREDS) CAPS Take 1 capsule by mouth 2 (two) times daily.      nitrofurantoin (MACRODANTIN) 50 MG capsule Take 1 capsule (50 mg total) by mouth daily as needed. [place]  capsule 3   NON FORMULARY Take 1 capsule by mouth 2 (two) times daily. Bio-Complete 3     NON FORMULARY ASEA REDOX 2 ox drink daily     NON FORMULARY D Mannose 2/day (1300 mg)     NON FORMULARY Symplex F 2 per day strengthen bone     Nutritional Supplements (CYTO-REDOXIN PO) Take 2 oz by mouth in the morning and at bedtime.     Omega-3 Fatty Acids (OMEGA 3 PO) Take 1 capsule by mouth daily.      sodium chloride (OCEAN) 0.65 % SOLN nasal spray Place 1  spray into both nostrils daily as needed for congestion.     tamoxifen (NOLVADEX) 10 MG tablet TAKE 1 TABLET BY MOUTH TWICE DAILY. 90 tablet 0   Turmeric (QC TUMERIC COMPLEX PO) Take 5 mLs by mouth daily.     vitamin E 180 MG (400 UNITS) capsule Take 400 Units by mouth daily.     No current facility-administered medications for this visit.    PHYSICAL EXAMINATION: ECOG PERFORMANCE STATUS: {CHL ONC ECOG PS:[phone]}  There were no vitals filed for this visit. There were no vitals filed for this visit.  BREAST:*** No palpable masses or nodules in either right or left breasts. No palpable axillary supraclavicular or infraclavicular adenopathy no breast tenderness or nipple discharge. (exam performed in the presence of a chaperone)  LABORATORY DATA:  I have reviewed the data as listed    Latest Ref Rng & Units 09/14/2021    8:10 AM 01/18/2021    9:19 AM 06/15/2020   11:38 AM  CMP  Glucose 65 - 99 mg/dL 72  81  96   BUN 7 - 25 mg/dL _0 Creatinine 0.60 - 1.00 mg/dL 0.89  0.96  0.85   Sodium 135 - 146 mmol/L 138  141  138   Potassium 3.5 - 5.3 mmol/L 4.5  4.6  3.9   Chloride 98 - 110 mmol/L 104  106  103   CO2 20 - 32 mmol/L _1 Calcium 8.6 - 10.4 mg/dL 9.5  9.4  9.5   Total Protein 6.1 - 8.1 g/dL 6.5  6.4  6.4   Total Bilirubin 0.2 - 1.2 mg/dL 0.5  0.4  0.3   AST 10 - 35 U/L _2 ALT 6 - 29 U/L _3 Lab Results  Component Value Date   WBC 4.8 09/14/2021   HGB 12.4 09/14/2021   HCT 37.3 09/14/2021   MCV 88.0 09/14/2021   PLT 221 09/14/2021   NEUTROABS 2,136 09/14/2021    ASSESSMENT & PLAN:  No problem-specific Assessment & Plan notes found for this encounter.    No orders of the defined types were placed in this encounter.  The patient has a good understanding of the overall plan. she agrees with it. she will call with any problems that may develop before the next visit here. Total time spent: 30 mins including face to face time and  time spent for planning, charting and co-ordination of care   Suzzette Righter, [place] 12/26/21    I Gardiner Coins am scribing for Dr. Lindi Adie  ***

## 2021-12-28 ENCOUNTER — Ambulatory Visit: Payer: Medicare PPO | Admitting: Hematology and Oncology

## 2021-12-29 DIAGNOSIS — M533 Sacrococcygeal disorders, not elsewhere classified: Secondary | ICD-10-CM | POA: Diagnosis not present

## 2022-01-03 DIAGNOSIS — M9904 Segmental and somatic dysfunction of sacral region: Secondary | ICD-10-CM | POA: Diagnosis not present

## 2022-01-03 DIAGNOSIS — M9901 Segmental and somatic dysfunction of cervical region: Secondary | ICD-10-CM | POA: Diagnosis not present

## 2022-01-03 DIAGNOSIS — M9903 Segmental and somatic dysfunction of lumbar region: Secondary | ICD-10-CM | POA: Diagnosis not present

## 2022-01-03 DIAGNOSIS — M5136 Other intervertebral disc degeneration, lumbar region: Secondary | ICD-10-CM | POA: Diagnosis not present

## 2022-01-03 DIAGNOSIS — M9902 Segmental and somatic dysfunction of thoracic region: Secondary | ICD-10-CM | POA: Diagnosis not present

## 2022-01-06 ENCOUNTER — Other Ambulatory Visit: Payer: Self-pay | Admitting: Internal Medicine

## 2022-01-09 ENCOUNTER — Other Ambulatory Visit: Payer: Self-pay | Admitting: Hematology and Oncology

## 2022-01-09 ENCOUNTER — Inpatient Hospital Stay: Payer: Medicare PPO | Attending: Hematology and Oncology | Admitting: Hematology and Oncology

## 2022-01-09 DIAGNOSIS — C50411 Malignant neoplasm of upper-outer quadrant of right female breast: Secondary | ICD-10-CM | POA: Diagnosis not present

## 2022-01-09 DIAGNOSIS — N951 Menopausal and female climacteric states: Secondary | ICD-10-CM | POA: Insufficient documentation

## 2022-01-09 DIAGNOSIS — Z170 Estrogen receptor positive status [ER+]: Secondary | ICD-10-CM | POA: Diagnosis not present

## 2022-01-09 DIAGNOSIS — Z79810 Long term (current) use of selective estrogen receptor modulators (SERMs): Secondary | ICD-10-CM | POA: Insufficient documentation

## 2022-01-09 DIAGNOSIS — Z87440 Personal history of urinary (tract) infections: Secondary | ICD-10-CM | POA: Insufficient documentation

## 2022-01-09 MED ORDER — VITAMIN D 25 MCG (1000 UNIT) PO TABS: 1000.0000 [IU] | ORAL_TABLET | Freq: Every day | ORAL | Status: AC

## 2022-01-12 ENCOUNTER — Other Ambulatory Visit: Payer: Self-pay | Admitting: Orthopedic Surgery

## 2022-01-12 ENCOUNTER — Other Ambulatory Visit: Payer: Self-pay | Admitting: Internal Medicine

## 2022-01-12 DIAGNOSIS — I10 Essential (primary) hypertension: Secondary | ICD-10-CM

## 2022-01-12 MED ORDER — LOSARTAN POTASSIUM 50 MG PO TABS: 50.0000 mg | ORAL_TABLET | Freq: Every day | ORAL | 3 refills | Status: DC

## 2022-01-12 NOTE — Telephone Encounter (Signed)
Patient has request refill on medication Losartan '50mg'$ . Patient medication instructions states for patient to take one tablet daily. Patient reported to pharmacy that she's taking 1 & 1/2 tablet daily and would like instructions edited by PCP Virgie Dad, MD . Medication pend and sent to PCP for approval/edits. Please Advise.

## 2022-01-18 ENCOUNTER — Other Ambulatory Visit: Payer: Self-pay

## 2022-01-18 DIAGNOSIS — I10 Essential (primary) hypertension: Secondary | ICD-10-CM

## 2022-01-18 MED ORDER — LOSARTAN POTASSIUM 50 MG PO TABS: 50.0000 mg | ORAL_TABLET | Freq: Every day | ORAL | 1 refills | Status: DC

## 2022-01-18 MED ORDER — LOSARTAN POTASSIUM 25 MG PO TABS: 25.0000 mg | ORAL_TABLET | Freq: Every evening | ORAL | 1 refills | Status: DC

## 2022-01-29 DIAGNOSIS — M9901 Segmental and somatic dysfunction of cervical region: Secondary | ICD-10-CM | POA: Diagnosis not present

## 2022-01-29 DIAGNOSIS — M9902 Segmental and somatic dysfunction of thoracic region: Secondary | ICD-10-CM | POA: Diagnosis not present

## 2022-01-29 DIAGNOSIS — M9903 Segmental and somatic dysfunction of lumbar region: Secondary | ICD-10-CM | POA: Diagnosis not present

## 2022-01-29 DIAGNOSIS — M9904 Segmental and somatic dysfunction of sacral region: Secondary | ICD-10-CM | POA: Diagnosis not present

## 2022-01-29 DIAGNOSIS — M5136 Other intervertebral disc degeneration, lumbar region: Secondary | ICD-10-CM | POA: Diagnosis not present

## 2022-02-12 ENCOUNTER — Other Ambulatory Visit: Payer: Self-pay | Admitting: Internal Medicine

## 2022-02-12 DIAGNOSIS — F330 Major depressive disorder, recurrent, mild: Secondary | ICD-10-CM | POA: Diagnosis not present

## 2022-02-12 NOTE — Telephone Encounter (Signed)
Patient request refill on medication.  Medication pended and sent to Windell Moulding, NP for approval

## 2022-02-20 DIAGNOSIS — F330 Major depressive disorder, recurrent, mild: Secondary | ICD-10-CM | POA: Diagnosis not present

## 2022-02-25 ENCOUNTER — Other Ambulatory Visit: Payer: Self-pay | Admitting: Hematology and Oncology

## 2022-02-27 ENCOUNTER — Encounter: Payer: Self-pay | Admitting: Internal Medicine

## 2022-02-27 DIAGNOSIS — F330 Major depressive disorder, recurrent, mild: Secondary | ICD-10-CM | POA: Diagnosis not present

## 2022-02-28 ENCOUNTER — Non-Acute Institutional Stay: Payer: Medicare PPO | Admitting: Internal Medicine

## 2022-02-28 ENCOUNTER — Encounter: Payer: Self-pay | Admitting: Internal Medicine

## 2022-02-28 VITALS — BP 143/85 | HR 94 | Temp 97.3°F | Resp 16 | Ht 64.0 in | Wt 140.4 lb

## 2022-02-28 DIAGNOSIS — N390 Urinary tract infection, site not specified: Secondary | ICD-10-CM | POA: Diagnosis not present

## 2022-02-28 DIAGNOSIS — I10 Essential (primary) hypertension: Secondary | ICD-10-CM

## 2022-02-28 DIAGNOSIS — Z1159 Encounter for screening for other viral diseases: Secondary | ICD-10-CM

## 2022-02-28 DIAGNOSIS — E038 Other specified hypothyroidism: Secondary | ICD-10-CM

## 2022-02-28 DIAGNOSIS — M533 Sacrococcygeal disorders, not elsewhere classified: Secondary | ICD-10-CM | POA: Diagnosis not present

## 2022-02-28 DIAGNOSIS — F331 Major depressive disorder, recurrent, moderate: Secondary | ICD-10-CM

## 2022-02-28 NOTE — Progress Notes (Unsigned)
Location: [facility] (12)  Provider:   Code Status:  Goals of Care:     02/28/2022    1:27 PM  Advanced Directives  Does Patient Have a Medical Advance Directive? Yes  Type of Paramedic of [place];Living will;Out of facility DNR (pink MOST or yellow form)  Does patient want to make changes to medical advance directive? No - Patient declined  Copy of [place] in Chart? Yes - validated most recent copy scanned in chart (See row information)     Chief Complaint  Patient presents with   Medical Management of Chronic Issues    3 month follow up.    Immunizations    Discuss the need for Influenza vaccine.    Health Maintenance    Discuss the need for Hepatitis C Screening.     HPI: Patient is a 71 y.o. female seen today for an acute visit for Follow up of her BP   Her active issues include Bilateral mastectomy for right breast invasive lobular cancer Follows with Dr. Sonny Dandy and is on tamoxifen since 2019. Plan for 10 years But recently she was told that it is possible that duration can be reduced to 5 years Sees oncology Annually Recurent UTI Using Estrogen cream and uses Macrodantin Prn with Sexual activity Got UTI on 06/23 Treated with Higher dose of Macrodantin Depression and Anxiety Takes Ativan and Pristiq Failed Ativan Taper Recent break up from her Boyfriend Very emotional and going through anxiety Low back pain  Takes Meloxiam Wears a brace and works with Restaurant manager, fast food  Was seen by Cassie Freer Diagnosed with Right Sacroiliac Joint dysfunction Underwent CT guided Injection which was successful and doing well Hypertension Increased Cozaar to 75 mg Doing well now Past Medical History:  Diagnosis Date   Anxiety    Arthritis    "back" (06/05/2018)   Breast cancer, right breast ([place]) 03/2018   Chronic lower back pain    "if I don't do yoga" (06/05/2018)   Depression     Family history of breast cancer    GERD (gastroesophageal reflux disease)    Herpes simplex    on nose   History of hiatal hernia    Macular degeneration    Per [facility] New Patient Packet    Nephritis    "as a child" (06/05/2018)   Osteoporosis    SCC (squamous cell carcinoma)    "under my nose" (06/05/2018)   Strabismus    Per [place] new patient packet    UTI (urinary tract infection) 8/15   Wears glasses     Past Surgical History:  Procedure Laterality Date   BREAST BIOPSY Right 04/2018   CATARACT EXTRACTION W/ INTRAOCULAR LENS  IMPLANT, BILATERAL Bilateral    CESAREAN SECTION  1977   COLONOSCOPY     CYST REMOVAL NECK Left 03/03/2014   Procedure: LEFT NECK CYST EXCISION;  Surgeon: Pedro Earls, MD;  Location: [place];  Service: General;  Laterality: Left;   DILATION AND CURETTAGE OF UTERUS     EYE SURGERY     Strabismus   MASTECTOMY Left 06/05/2018   MASTECTOMY COMPLETE / SIMPLE W/ SENTINEL NODE BIOPSY Right 06/05/2018   AND BLUE DYE INJECTION RIGHT BREAST   MASTECTOMY W/ SENTINEL NODE BIOPSY Bilateral 06/05/2018   Procedure: BILATERAL MASTECTOMIES WITH RIGHT SENTINEL LYMPH NODE BIOPSY AND BLUE DYE INJECTION RIGHT BREAST;  Surgeon: Donnie Mesa, MD;  Location: [place];  Service: General;  Laterality: Bilateral;   SQUAMOUS CELL CARCINOMA EXCISION     "under my nose"   STRABISMUS SURGERY Bilateral 05/02/2018   Procedure: BILATERAL REPAIR STRABISMUS;  Surgeon: Lamonte Sakai, MD;  Location: [place];  Service: Ophthalmology;  Laterality: Bilateral;   TONSILLECTOMY AND ADENOIDECTOMY     UPPER GI ENDOSCOPY     WISDOM TOOTH EXTRACTION      Allergies  Allergen Reactions   Bactrim [Sulfamethoxazole-Trimethoprim] Anaphylaxis, Hives and Rash    Outpatient Encounter Medications as of 02/28/2022  Medication Sig   cholecalciferol (VITAMIN D3) 25 MCG (1000 UNIT) tablet Take 1 tablet (1,000 Units total) by mouth daily.   desvenlafaxine (PRISTIQ) 50  MG 24 hr tablet Take 1 tablet (50 mg total) by mouth daily.   estradiol (ESTRACE) 0.1 MG/GM vaginal cream Place 1 Applicatorful vaginally 3 (three) times a week.   fluticasone (FLONASE) 50 MCG/ACT nasal spray Place into both nostrils as needed for allergies or rhinitis.   Hypromellose (ARTIFICIAL TEARS OP) Place 1 drop into both eyes daily in the afternoon.   LORazepam (ATIVAN) 0.5 MG tablet TAKE ONE TABLET BY MOUTH AT BEDTIME MAY TAKE EXTRA 1/2 TABLET AS NEEDED   losartan (COZAAR) 25 MG tablet Take 1 tablet (25 mg total) by mouth every evening.   losartan (COZAAR) 50 MG tablet Take 1 tablet (50 mg total) by mouth daily.   Magnesium 200 MG TABS Take 1 tablet by mouth daily.   meloxicam (MOBIC) 15 MG tablet TAKE 1 TABLET ONCE DAILY.   Multiple Vitamins-Minerals (PRESERVISION AREDS) CAPS Take 1 capsule by mouth 2 (two) times daily.    nitrofurantoin (MACRODANTIN) 50 MG capsule Take 1 capsule (50 mg total) by mouth daily as needed.   NON FORMULARY Take 1 capsule by mouth 2 (two) times daily. Bio-Complete 3   NON FORMULARY ASEA REDOX 2 ox drink daily   NON FORMULARY D Mannose 2/day (1300 mg)   NON FORMULARY Symplex F 2 per day strengthen bone   Nutritional Supplements (CYTO-REDOXIN PO) Take 2 oz by mouth in the morning and at bedtime.   Omega-3 Fatty Acids (OMEGA 3 PO) Take 1 capsule by mouth daily.    tamoxifen (NOLVADEX) 10 MG tablet TAKE ONE TABLET BY MOUTH TWICE DAILY   Turmeric (QC TUMERIC COMPLEX PO) Take 5 mLs by mouth daily.   vitamin E 180 MG (400 UNITS) capsule Take 400 Units by mouth daily.   [DISCONTINUED] sodium chloride (OCEAN) 0.65 % SOLN nasal spray Place 1 spray into both nostrils daily as needed for congestion.   No facility-administered encounter medications on file as of 02/28/2022.    Review of Systems:  Review of Systems  Constitutional:  Negative for activity change and appetite change.  HENT: Negative.    Respiratory:  Negative for cough and shortness of breath.    Cardiovascular:  Negative for leg swelling.  Gastrointestinal:  Negative for constipation.  Genitourinary: Negative.   Musculoskeletal:  Negative for arthralgias, gait problem and myalgias.  Skin: Negative.   Neurological:  Negative for dizziness and weakness.  Psychiatric/Behavioral:  Positive for dysphoric mood. Negative for confusion and sleep disturbance. The patient is nervous/anxious.     Health Maintenance  Topic Date Due   Hepatitis C Screening  Never done   INFLUENZA VACCINE  02/13/2022   COVID-19 Vaccine (5 - Moderna risk series) 06/06/2022 (Originally 05/31/2021)   TETANUS/TDAP  11/03/2025   COLONOSCOPY (Pts 45-43yr Insurance coverage will need to be confirmed)  12/22/2029   Pneumonia  Vaccine 53+ Years old  Completed   DEXA SCAN  Completed   Zoster Vaccines- Shingrix  Completed   HPV VACCINES  Aged Out   MAMMOGRAM  Discontinued    Physical Exam: Vitals:   02/28/22 1320  BP: (!) 143/85  Pulse: 94  Resp: 16  Temp: (!) 97.3 F (36.3 C)  SpO2: 90%  Weight: 140 lb 6.4 oz (63.7 kg)  Height: '5\' 4"'$  (1.626 m)   Body mass index is 24.1 kg/m. Physical Exam Vitals reviewed.  Constitutional:      Appearance: Normal appearance.  HENT:     Head: Normocephalic.     Nose: Nose normal.     Mouth/Throat:     Mouth: Mucous membranes are moist.     Pharynx: Oropharynx is clear.  Eyes:     Pupils: Pupils are equal, round, and reactive to light.  Cardiovascular:     Rate and Rhythm: Normal rate and regular rhythm.     Pulses: Normal pulses.     Heart sounds: Normal heart sounds. No murmur heard. Pulmonary:     Effort: Pulmonary effort is normal.     Breath sounds: Normal breath sounds.  Abdominal:     General: Abdomen is flat. Bowel sounds are normal.     Palpations: Abdomen is soft.  Musculoskeletal:        General: No swelling.     Cervical back: Neck supple.  Skin:    General: Skin is warm.  Neurological:     General: No focal deficit present.     Mental  Status: She is alert and oriented to person, place, and time.  Psychiatric:        Mood and Affect: Mood normal.        Thought Content: Thought content normal.     Labs reviewed: Basic Metabolic Panel: Recent Labs    09/14/21 0810 10/02/21 0800 11/16/21 1151  NA 138  --   --   K 4.5  --   --   CL 104  --   --   CO2 29  --   --   GLUCOSE 72  --   --   BUN 23  --   --   CREATININE 0.89  --   --   CALCIUM 9.5  --   --   TSH 5.56* 6.06* 5.27   Liver Function Tests: Recent Labs    09/14/21 0810  AST 16  ALT 14  BILITOT 0.5  PROT 6.5   No results for input(s): "LIPASE", "AMYLASE" in the last 8760 hours. No results for input(s): "AMMONIA" in the last 8760 hours. CBC: Recent Labs    09/14/21 0810  WBC 4.8  NEUTROABS 2,136  HGB 12.4  HCT 37.3  MCV 88.0  PLT 221   Lipid Panel: Recent Labs    09/14/21 0810  CHOL 185  HDL 71  LDLCALC 91  TRIG 124  CHOLHDL 2.6   Lab Results  Component Value Date   HGBA1C 5.6 09/14/2021    Procedures since last visit: No results found.  Assessment/Plan 1. Essential hypertension On Cozaar 75 mg Doing well Is not taking amlodipine anymore  2. Subclinical hypothyroidism TSH normal now Was diagnosed with Hashimoto's Thyroiditis Continue to monitor TSH  3. Recurrent UTI On Prophylaxis  4. SI (sacroiliac) joint dysfunction Doing well since injection Also Uses [place] and Avoid too much exercise  5. Moderate episode of recurrent major depressive disorder (HCC) Continues to have issues  On Pritiq and Ativan  per [place] 6 Breast Cancer history On Tamoxifen 7. Osteoporosis without current pathological fracture, unspecified osteoporosis type On Tamoxifen But Plan to add Prolia Per Endocrinology   8. Chronic bilateral low back pain without sciatica On Meloxicam  Labs/tests ordered:  * No order type specified * Next appt:  Visit date not found

## 2022-03-01 ENCOUNTER — Telehealth: Payer: Self-pay

## 2022-03-01 NOTE — Telephone Encounter (Signed)
Ok Will keep it off her list

## 2022-03-01 NOTE — Telephone Encounter (Signed)
Called patient and no answer. Voicemail was left with office call back number.   

## 2022-03-01 NOTE — Telephone Encounter (Signed)
Patient called back. She states that she is not taking amlodipine 5 mg. She stated that amlodipine was discontinued. She states that she is not going to take the amlodipine '5mg'$  because she doesn't see a need for it. Patient also stated that if it is imperative that she takes amlodpine '5mg'$  she will need to speak tieh Dr. Lyndel Safe first.  Message routed to Dr. Meredith Staggers. Lyndel Safe

## 2022-03-01 NOTE — Telephone Encounter (Signed)
-----   Message from Virgie Dad, MD sent at 03/01/2022 10:29 AM EDT ----- Can you please call her and ask if she is taking amlodipine 5 mg ? She is suppose to be but I dont see it in her list. Thanks Anjali

## 2022-03-01 NOTE — Telephone Encounter (Signed)
This encounter was created in error - please disregard.

## 2022-03-05 DIAGNOSIS — M9902 Segmental and somatic dysfunction of thoracic region: Secondary | ICD-10-CM | POA: Diagnosis not present

## 2022-03-05 DIAGNOSIS — M5136 Other intervertebral disc degeneration, lumbar region: Secondary | ICD-10-CM | POA: Diagnosis not present

## 2022-03-05 DIAGNOSIS — M9903 Segmental and somatic dysfunction of lumbar region: Secondary | ICD-10-CM | POA: Diagnosis not present

## 2022-03-05 DIAGNOSIS — M9904 Segmental and somatic dysfunction of sacral region: Secondary | ICD-10-CM | POA: Diagnosis not present

## 2022-03-05 DIAGNOSIS — M9901 Segmental and somatic dysfunction of cervical region: Secondary | ICD-10-CM | POA: Diagnosis not present

## 2022-03-08 DIAGNOSIS — N760 Acute vaginitis: Secondary | ICD-10-CM | POA: Diagnosis not present

## 2022-03-08 DIAGNOSIS — R300 Dysuria: Secondary | ICD-10-CM | POA: Diagnosis not present

## 2022-03-13 DIAGNOSIS — F330 Major depressive disorder, recurrent, mild: Secondary | ICD-10-CM | POA: Diagnosis not present

## 2022-03-26 ENCOUNTER — Ambulatory Visit (INDEPENDENT_AMBULATORY_CARE_PROVIDER_SITE_OTHER)
Admission: RE | Admit: 2022-03-26 | Discharge: 2022-03-26 | Disposition: A | Discharge status: Home / Self Care | Payer: Medicare PPO | Source: Ambulatory Visit | Attending: Internal Medicine | Admitting: Internal Medicine

## 2022-03-26 DIAGNOSIS — M810 Age-related osteoporosis without current pathological fracture: Secondary | ICD-10-CM

## 2022-03-26 DIAGNOSIS — R309 Painful micturition, unspecified: Secondary | ICD-10-CM | POA: Diagnosis not present

## 2022-03-26 DIAGNOSIS — N390 Urinary tract infection, site not specified: Secondary | ICD-10-CM | POA: Diagnosis not present

## 2022-04-04 ENCOUNTER — Other Ambulatory Visit: Payer: Self-pay | Admitting: Orthopedic Surgery

## 2022-04-04 NOTE — Telephone Encounter (Signed)
Rx was last filled 02/12/2022 #45 with no refills. No treatment agreement on file, notation was made on pending appointment 08/2022.

## 2022-04-09 ENCOUNTER — Non-Acute Institutional Stay: Payer: Medicare PPO | Admitting: Orthopedic Surgery

## 2022-04-09 ENCOUNTER — Ambulatory Visit
Admission: RE | Admit: 2022-04-09 | Discharge: 2022-04-09 | Disposition: A | Discharge status: Home / Self Care | Payer: Medicare PPO | Source: Ambulatory Visit | Attending: Orthopedic Surgery | Admitting: Orthopedic Surgery

## 2022-04-09 ENCOUNTER — Encounter: Payer: Self-pay | Admitting: Orthopedic Surgery

## 2022-04-09 VITALS — BP 132/84 | HR 98 | Temp 97.5°F | Resp 16 | Ht 64.0 in | Wt 140.0 lb

## 2022-04-09 DIAGNOSIS — R911 Solitary pulmonary nodule: Secondary | ICD-10-CM | POA: Diagnosis not present

## 2022-04-09 DIAGNOSIS — R059 Cough, unspecified: Secondary | ICD-10-CM | POA: Diagnosis not present

## 2022-04-09 DIAGNOSIS — R051 Acute cough: Secondary | ICD-10-CM | POA: Diagnosis not present

## 2022-04-09 NOTE — Patient Instructions (Addendum)
Please get chest xray at [facility]- you may go at anytime  Continue fluids  I will call with results within next 24 hours

## 2022-04-09 NOTE — Progress Notes (Signed)
Location:  [place] of Service:  ALF [phone]) Provider:  Yvonna Alanis, NP   Virgie Dad, MD  Patient Care Team: Virgie Dad, MD as PCP - General (Internal Medicine) Ronnette Juniper, MD as Consulting Physician (Gastroenterology) Phylliss Bob, MD as Consulting Physician (Orthopedic Surgery) Nicholas Lose, MD as Consulting Physician (Hematology and Oncology) Dian Queen, MD as Consulting Physician (Obstetrics and Gynecology) Marygrace Drought, MD as Consulting Physician (Ophthalmology) Leta Baptist, MD as Consulting Physician (Otolaryngology)  Extended Emergency Contact Information Primary Emergency Contact: Jemma, Rasp Mobile Phone: [phone] Relation: Daughter  Code Status:   Goals of care: Advanced Directive information    02/28/2022    1:27 PM  Advanced Directives  Does Patient Have a Medical Advance Directive? Yes  Type of Paramedic of [place];Living will;Out of facility DNR (pink MOST or yellow form)  Does patient want to make changes to medical advance directive? No - Patient declined  Copy of [place] in Chart? Yes - validated most recent copy scanned in chart (See row information)     Chief Complaint  Patient presents with   Acute Visit    cough    HPI:  Lisa Yoder is a 71 y.o. female seen today for acute visit due to ongoing cough.   09/15 she began to have nonproductive cough after seeing grandson. She tested herself twice for covid and was negative. She reports increased fatigue and chest pain at beginning of illness. She denies chest pain, sob and fatigue today. She has been taking mucinex and benadryl without success. Cough is not keeping her up at night. She was recently on antibiotics due to recent UTI. Treatment options discussed with patient, she agrees to have CXR. PNA vaccines up to date. She plans to get flu vaccine next month when offered.    Past Medical History:  Diagnosis Date   Anxiety     Arthritis    "back" (06/05/2018)   Breast cancer, right breast ([place]) 03/2018   Chronic lower back pain    "if I don't do yoga" (06/05/2018)   Depression    Family history of breast cancer    GERD (gastroesophageal reflux disease)    Herpes simplex    on nose   History of hiatal hernia    Macular degeneration    Per [facility] New Patient Packet    Nephritis    "as a child" (06/05/2018)   Osteoporosis    SCC (squamous cell carcinoma)    "under my nose" (06/05/2018)   Strabismus    Per [place] new patient packet    UTI (urinary tract infection) 8/15   Wears glasses    Past Surgical History:  Procedure Laterality Date   BREAST BIOPSY Right 04/2018   CATARACT EXTRACTION W/ INTRAOCULAR LENS  IMPLANT, BILATERAL Bilateral    CESAREAN SECTION  1977   COLONOSCOPY     CYST REMOVAL NECK Left 03/03/2014   Procedure: LEFT NECK CYST EXCISION;  Surgeon: Pedro Earls, MD;  Location: [place];  Service: General;  Laterality: Left;   DILATION AND CURETTAGE OF UTERUS     EYE SURGERY     Strabismus   MASTECTOMY Left 06/05/2018   MASTECTOMY COMPLETE / SIMPLE W/ SENTINEL NODE BIOPSY Right 06/05/2018   AND BLUE DYE INJECTION RIGHT BREAST   MASTECTOMY W/ SENTINEL NODE BIOPSY Bilateral 06/05/2018   Procedure: BILATERAL MASTECTOMIES WITH RIGHT SENTINEL LYMPH NODE BIOPSY AND BLUE DYE INJECTION RIGHT BREAST;  Surgeon: Donnie Mesa, MD;  Location: [place];  Service: General;  Laterality: Bilateral;   SQUAMOUS CELL CARCINOMA EXCISION     "under my nose"   STRABISMUS SURGERY Bilateral 05/02/2018   Procedure: BILATERAL REPAIR STRABISMUS;  Surgeon: Lamonte Sakai, MD;  Location: [place];  Service: Ophthalmology;  Laterality: Bilateral;   TONSILLECTOMY AND ADENOIDECTOMY     UPPER GI ENDOSCOPY     WISDOM TOOTH EXTRACTION      Allergies  Allergen Reactions   Bactrim [Sulfamethoxazole-Trimethoprim] Anaphylaxis, Hives and Rash    Outpatient Encounter Medications as of  04/09/2022  Medication Sig   cholecalciferol (VITAMIN D3) 25 MCG (1000 UNIT) tablet Take 1 tablet (1,000 Units total) by mouth daily.   desvenlafaxine (PRISTIQ) 50 MG 24 hr tablet Take 1 tablet (50 mg total) by mouth daily.   estradiol (ESTRACE) 0.1 MG/GM vaginal cream Place 1 Applicatorful vaginally 3 (three) times a week.   fluticasone (FLONASE) 50 MCG/ACT nasal spray Place into both nostrils as needed for allergies or rhinitis.   Hypromellose (ARTIFICIAL TEARS OP) Place 1 drop into both eyes daily in the afternoon.   LORazepam (ATIVAN) 0.5 MG tablet TAKE ONE TABLET BY MOUTH AT BEDTIME MAY TAKE EXTRA 1/2 TABLET AS NEEDED   losartan (COZAAR) 25 MG tablet Take 1 tablet (25 mg total) by mouth every evening.   losartan (COZAAR) 50 MG tablet Take 1 tablet (50 mg total) by mouth daily.   Magnesium 200 MG TABS Take 1 tablet by mouth daily.   meloxicam (MOBIC) 15 MG tablet TAKE 1 TABLET ONCE DAILY.   Multiple Vitamins-Minerals (PRESERVISION AREDS) CAPS Take 1 capsule by mouth 2 (two) times daily.    nitrofurantoin (MACRODANTIN) 50 MG capsule Take 1 capsule (50 mg total) by mouth daily as needed.   NON FORMULARY Take 1 capsule by mouth 2 (two) times daily. Bio-Complete 3   NON FORMULARY ASEA REDOX 2 ox drink daily   NON FORMULARY D Mannose 2/day (1300 mg)   NON FORMULARY Symplex F 2 per day strengthen bone   Nutritional Supplements (CYTO-REDOXIN PO) Take 2 oz by mouth in the morning and at bedtime.   Omega-3 Fatty Acids (OMEGA 3 PO) Take 1 capsule by mouth daily.    tamoxifen (NOLVADEX) 10 MG tablet TAKE ONE TABLET BY MOUTH TWICE DAILY   Turmeric (QC TUMERIC COMPLEX PO) Take 5 mLs by mouth daily.   vitamin E 180 MG (400 UNITS) capsule Take 400 Units by mouth daily.   No facility-administered encounter medications on file as of 04/09/2022.    Review of Systems  Constitutional:  Positive for fatigue. Negative for activity change, appetite change, chills and fever.  HENT:  Positive for congestion.  Negative for trouble swallowing.   Respiratory:  Positive for cough. Negative for shortness of breath and wheezing.   Cardiovascular:  Negative for chest pain and leg swelling.  Gastrointestinal:  Negative for nausea and vomiting.  Genitourinary:  Negative for dysuria and frequency.  Musculoskeletal:  Negative for myalgias.  Skin:  Negative for rash.  Neurological:  Negative for weakness.  Psychiatric/Behavioral:  Negative for confusion and dysphoric mood. The patient is not nervous/anxious.     Immunization History  Administered Date(s) Administered   DTaP 01/20/2007   Fluad Quad(high Dose 65+) 03/31/2019   Influenza Split 04/21/2015, 05/04/2017, 04/17/2018   Influenza, High Dose Seasonal PF 05/04/2017, 04/18/2018, 04/20/2021   Influenza-Unspecified 03/30/2011, 04/05/2020   Moderna SARS-COV2 Booster Vaccination 12/05/2020   Moderna Sars-Covid-2 Vaccination 07/20/2019, 08/17/2019, 05/30/2020  PFIZER(Purple Top)SARS-COV-2 Vaccination 03/16/2021   PPD Test 07/05/2016   Pfizer Covid-19 Vaccine Bivalent Booster 30yr & up 04/05/2021   Pneumococcal Conjugate-13 11/04/2015   Pneumococcal Polysaccharide-23 11/05/2016   Tdap 11/04/2015   Zoster Recombinat (Shingrix) 01/22/2018, 03/28/2018   Pertinent  Health Maintenance Due  Topic Date Due   INFLUENZA VACCINE  02/13/2022   COLONOSCOPY (Pts 45-413yrInsurance coverage will need to be confirmed)  12/22/2029   DEXA SCAN  Completed   MAMMOGRAM  Discontinued      10/09/2021    1:27 PM 11/22/2021    3:02 PM 01/09/2022    1:00 PM 02/27/2022    4:34 PM 02/28/2022    1:26 PM  [place] the past year? 0 0  0 1  Was there an injury with Fall? 0 0  0 0  Fall Risk Category Calculator 0 0  0 1  Fall Risk Category Low Low  Low Low  Patient Fall Risk Level Low fall risk Low fall risk Moderate fall risk Low fall risk Low fall risk  Patient at Risk for Falls Due to No Fall Risks No Fall Risks  No Fall Risks Impaired balance/gait  Fall  risk Follow up Falls evaluation completed Falls evaluation completed  Falls evaluation completed Falls evaluation completed   Functional Status Survey:    Vitals:   04/09/22 1258  BP: 132/84  Pulse: 98  Resp: 16  Temp: (!) 97.5 F (36.4 C)  SpO2: 98%  Weight: 140 lb (63.5 kg)  Height: '5\' 4"'$  (1.626 m)   Body mass index is 24.03 kg/m. Physical Exam Vitals reviewed.  Constitutional:      General: She is not in acute distress. HENT:     Head: Normocephalic.     Nose: Nose normal.     Mouth/Throat:     Mouth: Mucous membranes are moist.  Eyes:     General:        Right eye: No discharge.        Left eye: No discharge.  Cardiovascular:     Rate and Rhythm: Normal rate and regular rhythm.     Pulses: Normal pulses.     Heart sounds: Normal heart sounds.  Pulmonary:     Effort: Pulmonary effort is normal. No respiratory distress.     Breath sounds: Examination of the right-middle field reveals decreased breath sounds. Examination of the left-middle field reveals decreased breath sounds. Decreased breath sounds present. No wheezing.  Abdominal:     General: Bowel sounds are normal. There is no distension.     Palpations: Abdomen is soft.     Tenderness: There is no abdominal tenderness.  Musculoskeletal:     Cervical back: Neck supple.     Right lower leg: No edema.     Left lower leg: No edema.  Skin:    General: Skin is warm and dry.     Capillary Refill: Capillary refill takes less than 2 seconds.  Neurological:     General: No focal deficit present.     Mental Status: She is alert and oriented to person, place, and time.  Psychiatric:        Mood and Affect: Mood normal.        Behavior: Behavior normal.     Labs reviewed: Recent Labs    09/14/21 0810  NA 138  K 4.5  CL 104  CO2 29  GLUCOSE 72  BUN 23  CREATININE 0.89  CALCIUM 9.5   Recent Labs  09/14/21 0810  AST 16  ALT 14  BILITOT 0.5  PROT 6.5   Recent Labs    09/14/21 0810  WBC 4.8   NEUTROABS 2,136  HGB 12.4  HCT 37.3  MCV 88.0  PLT 221   Lab Results  Component Value Date   TSH 5.27 11/16/2021   Lab Results  Component Value Date   HGBA1C 5.6 09/14/2021   Lab Results  Component Value Date   CHOL 185 09/14/2021   HDL 71 09/14/2021   LDLCALC 91 09/14/2021   TRIG 124 09/14/2021   CHOLHDL 2.6 09/14/2021    Significant Diagnostic Results in last 30 days:  DG Bone Density  Result Date: 03/28/2022 Table formatting from the original result was not included. Date of study: 03/26/2022 Exam: DUAL X-RAY ABSORPTIOMETRY (DXA) FOR BONE MINERAL DENSITY (BMD) Instrument: [employer] Requesting Provider: PCP Indication: screening for osteoporosis Comparison:  2021 Clinical data: Lisa Yoder is a 71 y.o. female without history of fracture. Results:  Lumbar spine L1-L4(L2) Femoral neck (FN) T-score   -2.5 RFN: -3.0 LFN: -3.0 Change in BMD from previous DXA test (%) Down 1.4% Down 2.1% (*) statistically significant Assessment: Patient has OSTEOPOROSIS according to the [facility] classification for osteoporosis (see below). Fracture risk: high L2 vertebra had to be excluded from analysis due to DJD Comments: the technical quality of the study is good. In patients older than 75, the high prevalence of degenerative changes in the lumbar spine results in artificially higher bone density readings. WHO criteria for diagnosis of osteoporosis in postmenopausal women and in men 43 y/o or older: - normal: T-score -1.0 to + 1.0 - osteopenia/low bone density: T-score between -2.5 and -1.0 - osteoporosis: T-score below -2.5 - severe osteoporosis: T-score below -2.5 with history of fragility fracture Note: although not part of the WHO classification, the presence of a fragility fracture, regardless of the T-score, should be considered diagnostic of osteoporosis, provided other causes for the fracture have been excluded. Treatment: The National Osteoporosis Foundation recommends that treatment be considered in  postmenopausal women and men age 35 or older with: 1. Hip or vertebral (clinical or morphometric) fracture 2. T-score of - 2.5 or lower at the spine or hip 3. 10-year fracture probability by FRAX of at least 20% for a major osteoporotic fracture and 3% for a hip fracture Interpreted by : Mack Guise, MD [facility]    Assessment/Plan 1. Acute cough - onset 09/15, grandson was sick - 2 covid tests negative - fatigue/chest pain at beginning of illness - cough non productive - diminished lung sounds to middle lobes - recently on antibiotics for UTI - CXR- negative for acute pleural effusion, infiltrate - DG Chest 2 View; Future - benzonatate (TESSALON) 100 MG capsule; Take 2 capsules (200 mg total) by mouth 2 (two) times daily as needed for cough.  Dispense: 20 capsule; Refill: 0 - predniSONE (DELTASONE) 10 MG tablet; Take 4 tablets (40 mg total) by mouth daily with breakfast for 1 day, THEN 3 tablets (30 mg total) daily with breakfast for 1 day, THEN 2 tablets (20 mg total) daily with breakfast for 1 day, THEN 1 tablet (10 mg total) daily with breakfast for 1 day, THEN 0.5 tablets (5 mg total) daily with breakfast for 1 day.  Dispense: 10.5 tablet; Refill: 0  2. Pulmonary nodule - 09/25 CXR- possible nodule to left upper lung, CT chest recommended  - h/o smoking < 5 years - h/o breast cancer 2019 - CT CHEST NODULE FOLLOW  UP LOW DOSE W/O   Family/ staff Communication: plan discussed with patient and nurse  Labs/tests ordered:  CXR

## 2022-04-10 ENCOUNTER — Telehealth: Payer: Self-pay

## 2022-04-10 MED ORDER — BENZONATATE 100 MG PO CAPS: 200.0000 mg | ORAL_CAPSULE | Freq: Two times a day (BID) | ORAL | 0 refills | Status: DC | PRN

## 2022-04-10 MED ORDER — PREDNISONE 10 MG PO TABS: ORAL_TABLET | ORAL | 0 refills | Status: AC

## 2022-04-10 NOTE — Addendum Note (Signed)
Addended byCleophas Dunker, Frenchie Pribyl E on: 04/10/2022 12:00 PM   Modules accepted: Orders

## 2022-04-10 NOTE — Telephone Encounter (Signed)
Results discussed with patient. Orders placed for CT chest.

## 2022-04-10 NOTE — Telephone Encounter (Signed)
[facility] called to advised Amy fargo,NP of patients chest x-ray report: Possible nodular density projecting over the left upper lung,potentially overlapping tissues.Recommend further evaluation with chest CT.

## 2022-04-11 ENCOUNTER — Other Ambulatory Visit: Payer: Self-pay

## 2022-04-11 DIAGNOSIS — I10 Essential (primary) hypertension: Secondary | ICD-10-CM

## 2022-04-11 DIAGNOSIS — E038 Other specified hypothyroidism: Secondary | ICD-10-CM

## 2022-04-11 DIAGNOSIS — Z1159 Encounter for screening for other viral diseases: Secondary | ICD-10-CM

## 2022-04-16 DIAGNOSIS — M9901 Segmental and somatic dysfunction of cervical region: Secondary | ICD-10-CM | POA: Diagnosis not present

## 2022-04-16 DIAGNOSIS — M9903 Segmental and somatic dysfunction of lumbar region: Secondary | ICD-10-CM | POA: Diagnosis not present

## 2022-04-16 DIAGNOSIS — M9902 Segmental and somatic dysfunction of thoracic region: Secondary | ICD-10-CM | POA: Diagnosis not present

## 2022-04-16 DIAGNOSIS — M9904 Segmental and somatic dysfunction of sacral region: Secondary | ICD-10-CM | POA: Diagnosis not present

## 2022-04-16 DIAGNOSIS — M5136 Other intervertebral disc degeneration, lumbar region: Secondary | ICD-10-CM | POA: Diagnosis not present

## 2022-04-17 ENCOUNTER — Other Ambulatory Visit: Payer: Self-pay | Admitting: Hematology and Oncology

## 2022-04-18 ENCOUNTER — Encounter: Payer: Self-pay | Admitting: Hematology and Oncology

## 2022-05-04 ENCOUNTER — Ambulatory Visit
Admission: RE | Admit: 2022-05-04 | Discharge: 2022-05-04 | Disposition: A | Discharge status: Home / Self Care | Payer: Medicare PPO | Source: Ambulatory Visit | Attending: Orthopedic Surgery | Admitting: Orthopedic Surgery

## 2022-05-04 DIAGNOSIS — R918 Other nonspecific abnormal finding of lung field: Secondary | ICD-10-CM | POA: Diagnosis not present

## 2022-05-04 DIAGNOSIS — I700 Atherosclerosis of aorta: Secondary | ICD-10-CM | POA: Diagnosis not present

## 2022-05-04 DIAGNOSIS — J479 Bronchiectasis, uncomplicated: Secondary | ICD-10-CM | POA: Diagnosis not present

## 2022-05-04 DIAGNOSIS — R911 Solitary pulmonary nodule: Secondary | ICD-10-CM | POA: Diagnosis not present

## 2022-05-14 ENCOUNTER — Telehealth: Payer: Self-pay

## 2022-05-14 DIAGNOSIS — M9904 Segmental and somatic dysfunction of sacral region: Secondary | ICD-10-CM | POA: Diagnosis not present

## 2022-05-14 DIAGNOSIS — M9903 Segmental and somatic dysfunction of lumbar region: Secondary | ICD-10-CM | POA: Diagnosis not present

## 2022-05-14 DIAGNOSIS — M9901 Segmental and somatic dysfunction of cervical region: Secondary | ICD-10-CM | POA: Diagnosis not present

## 2022-05-14 DIAGNOSIS — M9902 Segmental and somatic dysfunction of thoracic region: Secondary | ICD-10-CM | POA: Diagnosis not present

## 2022-05-14 DIAGNOSIS — M5136 Other intervertebral disc degeneration, lumbar region: Secondary | ICD-10-CM | POA: Diagnosis not present

## 2022-05-14 NOTE — Telephone Encounter (Signed)
Her CT scan has not been read yet by [facility]. Can you call them and see what is going on ? And also let her know that still is pending. Thanks

## 2022-05-14 NOTE — Telephone Encounter (Signed)
Patient called and states that nobody has told her what CT Scan results are. Can you please make notes on it so patient can be notified. Thank you. Message routed to PCP Virgie Dad, MD

## 2022-05-15 NOTE — Telephone Encounter (Signed)
[facility] and they stated that CT is still pending. Order changed to STAT for review. Message routed back to PCP Virgie Dad, MD

## 2022-05-22 ENCOUNTER — Encounter: Payer: Self-pay | Admitting: Internal Medicine

## 2022-05-22 ENCOUNTER — Ambulatory Visit: Payer: Medicare PPO | Admitting: Internal Medicine

## 2022-05-22 VITALS — BP 124/82 | HR 90 | Ht 64.0 in | Wt 138.6 lb

## 2022-05-22 DIAGNOSIS — E559 Vitamin D deficiency, unspecified: Secondary | ICD-10-CM

## 2022-05-22 DIAGNOSIS — E063 Autoimmune thyroiditis: Secondary | ICD-10-CM

## 2022-05-22 DIAGNOSIS — M810 Age-related osteoporosis without current pathological fracture: Secondary | ICD-10-CM | POA: Diagnosis not present

## 2022-05-22 DIAGNOSIS — E038 Other specified hypothyroidism: Secondary | ICD-10-CM | POA: Diagnosis not present

## 2022-05-22 LAB — BASIC METABOLIC PANEL
BUN: 24 mg/dL — ABNORMAL HIGH (ref 6–23)
CO2: 29 mEq/L (ref 19–32)
Calcium: 9.5 mg/dL (ref 8.4–10.5)
Chloride: 103 mEq/L (ref 96–112)
Creatinine, Ser: 0.99 mg/dL (ref 0.40–1.20)
GFR: 56.5 mL/min — ABNORMAL LOW (ref >60.00)
Glucose, Bld: 98 mg/dL (ref 70–99)
Potassium: 4.1 mEq/L (ref 3.5–5.1)
Sodium: 137 mEq/L (ref 135–145)

## 2022-05-22 LAB — T4, FREE: Free T4: 0.62 ng/dL (ref 0.60–1.60)

## 2022-05-22 LAB — TSH: TSH: 3.25 u[IU]/mL (ref 0.35–5.50)

## 2022-05-22 LAB — VITAMIN D 25 HYDROXY (VIT D DEFICIENCY, FRACTURES): VITD: 47.12 ng/mL (ref 30.00–100.00)

## 2022-05-22 NOTE — Patient Instructions (Addendum)
Please stop at the lab.  Please come back for a follow-up appointment in 1 year.  Denosumab Injection (Osteoporosis) What is this medication? DENOSUMAB (den oh SUE mab) prevents and treats osteoporosis. It works by Paramedic stronger and less likely to break (fracture). It is a monoclonal antibody. This medicine may be used for other purposes; ask your health care provider or pharmacist if you have questions. COMMON BRAND NAME(S): Prolia What should I tell my care team before I take this medication? They need to know if you have any of these conditions: Dental or gum disease, or plan to have dental surgery or a tooth pulled Infection Kidney disease Low levels of calcium or vitamin D in your blood On dialysis Poor nutrition Skin conditions Thyroid disease, or have had thyroid or parathyroid surgery Trouble absorbing minerals in your stomach or intestine An unusual or allergic reaction to denosumab, other medications, foods, dyes, or preservatives Pregnant or trying to get pregnant Breastfeeding How should I use this medication? This medication is injected under the skin. It is given by your care team in a hospital or clinic setting. A special MedGuide will be given to you before each treatment. Be sure to read this information carefully each time. Talk to your care team about the use of this medication in children. Special care may be needed. Overdosage: If you think you have taken too much of this medicine contact a poison control center or emergency room at once. NOTE: This medicine is only for you. Do not share this medicine with others. What if I miss a dose? Keep appointments for follow-up doses. It is important not to miss your dose. Call your care team if you are unable to keep an appointment. What may interact with this medication? Do not take this medication with any of the following: Other medications that contain denosumab This medication may also interact with the  following: Medications that lower your chance of fighting infection Steroid medications, such as prednisone or cortisone This list may not describe all possible interactions. Give your health care provider a list of all the medicines, herbs, non-prescription drugs, or dietary supplements you use. Also tell them if you smoke, drink alcohol, or use illegal drugs. Some items may interact with your medicine. What should I watch for while using this medication? Your condition will be monitored carefully while you are receiving this medication. You may need blood work while taking this medication. This medication may increase your risk of getting an infection. Call your care team for advice if you get a fever, chills, sore throat, or other symptoms of a cold or flu. Do not treat yourself. Try to avoid being around people who are sick. Tell your dentist and dental surgeon that you are taking this medication. You should not have major dental surgery while on this medication. See your dentist to have a dental exam and fix any dental problems before starting this medication. Take good care of your teeth while on this medication. Make sure you see your dentist for regular follow-up appointments. You should make sure you get enough calcium and vitamin D while you are taking this medication. Discuss the foods you eat and the vitamins you take with your care team. Talk to your care team if you are pregnant or think you might be pregnant. This medication can cause serious birth defects if taken during pregnancy and for 5 months after the last dose. You will need a negative pregnancy test before starting this medication.  Contraception is recommended while taking this medication and for 5 months after the last dose. Your care team can help you find the option that works for you. Talk to your care team before breastfeeding. Changes to your treatment plan may be needed. What side effects may I notice from receiving this  medication? Side effects that you should report to your care team as soon as possible: Allergic reactions--skin rash, itching, hives, swelling of the face, lips, tongue, or throat Infection--fever, chills, cough, sore throat, wounds that don't heal, pain or trouble when passing urine, general feeling of discomfort or being unwell Low calcium level--muscle pain or cramps, confusion, tingling, or numbness in the hands or feet Osteonecrosis of the jaw--pain, swelling, or redness in the mouth, numbness of the jaw, poor healing after dental work, unusual discharge from the mouth, visible bones in the mouth Severe bone, joint, or muscle pain Skin infection--skin redness, swelling, warmth, or pain Side effects that usually do not require medical attention (report these to your care team if they continue or are bothersome): Back pain Headache Joint pain Muscle pain Pain in the hands, arms, legs, or feet Runny or stuffy nose Sore throat This list may not describe all possible side effects. Call your doctor for medical advice about side effects. You may report side effects to FDA at 1-800-FDA-1088. Where should I keep my medication? This medication is given in a hospital or clinic. It will not be stored at home. NOTE: This sheet is a summary. It may not cover all possible information. If you have questions about this medicine, talk to your doctor, pharmacist, or health care provider.  2023 Elsevier/Gold Standard (2021-11-13 00:00:00)

## 2022-05-22 NOTE — Progress Notes (Unsigned)
Patient ID: Lisa Yoder, female   DOB: 1951-05-28, 71 y.o.   MRN: [id number]  HPI  Lisa Yoder is a 71 y.o.-year-old female, returning for for new diagnosis of Hashimoto's hypothyroidism and osteoporosis.  Last visit 6 months ago. ObGyn: Dr. Dian Queen.  Interim history: She continues to have fatigue, weight gain, tremors, generalized aches and pains, constipation. She has R SI pain - has a belt. Tried yoga, massage, PT, chiropractor.  She sees orthopedics - has steroid inj. No recent falls or fractures.she has a history of a fall on the deck in 2018, but no fractures.  She denies dizziness, vertigo, orthostasis.  Reviewed and addended history: Osteoporosis: - dx'ed in 2006.  Reviewed patient's DXA scan reports:  DXA 03/26/2022 ([place])  Lumbar spine L1-L4(L2) Femoral neck (FN)  T-score   -2.5 RFN: -3.0 LFN: -3.0  Change in BMD from previous DXA test (%) Down 1.4% Down 2.1%  (*) statistically significant   DXA 03/16/2020 ([place]) Lumbar spine L1-L4(L3) Femoral neck (FN)  T-score   -2.7 RFN: -2.9 LFN: -2.9  Change in BMD from previous DXA test (%) Up 9.3%* Up 1.3%  (*) statistically significant  Previously: Date L1-L4 T score FN T score  05/28/2017 ([place]) (L3): -3.3 (-0.9%) LFN: -2.8 (-2.2%*) RFN: -3.1  05/27/2015 () -3.0 LFN: -2.7 RFN: -3.1  05/25/2013 ([facility]) -3.5 (-10.4%* c/w 03/14/2009) LFN: -3.4 RFN: -3.5  03/14/2009 -2.8   03/03/2007 -2.7   04/13/2005 -2.5    She was on the following osteoporosis treatments: - Fosamax in 2006 >> for 1-2 years. - Actonel >> for 2 more years.  She previously refused Prolia but at last visit she agreed to start this but did not start yet as she was reticent to do so.  + history of vitamin D deficiency.  Reviewed recent vitamin D levels: Lab Results  Component Value Date   VD25OH 40.61 11/16/2021   VD25OH 43 10/02/2019   VD25OH 31 05/20/2019   VD25OH 37.68 10/22/2017   VD25OH 31.59  08/28/2016   VD25OH 43.57 09/02/2015   VD25OH 42 10/08/2014  In the past, she was on 5000 units vitamin D daily, but as vitamin D level was close to the lower limit of normal, I advised her to increase to 7000 units daily >> then 2000 units daily and then we increased to 4000 units daily. This is also followed by Dr. Lyndel Safe >> dose decreased to 2000 and then to 1000 units daily.  She does weightbearing exercises.  She is not on high vitamin A doses.  No history of sustained hypo or hypercalcemia.  No hyperparathyroidism.  No kidney stones. Lab Results  Component Value Date   CALCIUM 9.5 09/14/2021   CALCIUM 9.4 01/18/2021   CALCIUM 9.5 06/15/2020   CALCIUM 10.0 01/28/2020   CALCIUM 9.2 10/02/2019   CALCIUM 9.8 05/20/2019   CALCIUM 9.2 06/06/2018   CALCIUM 9.3 05/28/2018   CALCIUM 9.6 10/22/2017   CALCIUM 10.1 08/28/2016   No history of CKD.  Latest creatinine was higher: Lab Results  Component Value Date   BUN 23 09/14/2021   CREATININE 0.89 09/14/2021   Menopause was at 71 years old.  She does have a family history of osteoporosis in her sister, who is on Forteo.  Hashimoto's thyroiditis: -Recent diagnosis: Lab Results  Component Value Date   TSH 5.27 11/16/2021   TSH 6.06 (H) 10/02/2021   TSH 5.56 (H) 09/14/2021   TSH 5.32 (H) 01/18/2021   TSH 4.43 05/20/2019  Lab Results  Component Value Date   FREET4 0.63 11/16/2021   T3FREE 2.4 11/16/2021   Total T4 level was normal on 10/02/2021.  Component     Latest Ref Rng 10/02/2021  Thyroglobulin Ab     < or = 1 IU/mL 1   Thyroperoxidase Ab SerPl-aCnc     <9 IU/mL 434 (H)     Pt denies: - feeling nodules in neck - hoarseness - choking She has dysphagia with dry food - chronic. She has a history of a small hiatal hernia.  No FH of thyroid disease or thyroid cancer. No h/o radiation tx to head or neck. No seaweed or kelp. No recent contrast studies. No herbal supplements. No Biotin use. No recent steroids use.    She resides in the [place] community.   ROS: + see HPI  I reviewed pt's medications, allergies, PMH, social hx, family hx, and changes were documented in the history of present illness. Otherwise, unchanged from my initial visit note.  Past Medical History:  Diagnosis Date   Anxiety    Arthritis    "back" (06/05/2018)   Breast cancer, right breast ([place]) 03/2018   Chronic lower back pain    "if I don't do yoga" (06/05/2018)   Depression    Family history of breast cancer    GERD (gastroesophageal reflux disease)    Herpes simplex    on nose   History of hiatal hernia    Macular degeneration    Per [facility] New Patient Packet    Nephritis    "as a child" (06/05/2018)   Osteoporosis    SCC (squamous cell carcinoma)    "under my nose" (06/05/2018)   Strabismus    Per [place] new patient packet    UTI (urinary tract infection) 8/15   Wears glasses    Past Surgical History:  Procedure Laterality Date   BREAST BIOPSY Right 04/2018   CATARACT EXTRACTION W/ INTRAOCULAR LENS  IMPLANT, BILATERAL Bilateral    CESAREAN SECTION  1977   COLONOSCOPY     CYST REMOVAL NECK Left 03/03/2014   Procedure: LEFT NECK CYST EXCISION;  Surgeon: Pedro Earls, MD;  Location: [place];  Service: General;  Laterality: Left;   DILATION AND CURETTAGE OF UTERUS     EYE SURGERY     Strabismus   MASTECTOMY Left 06/05/2018   MASTECTOMY COMPLETE / SIMPLE W/ SENTINEL NODE BIOPSY Right 06/05/2018   AND BLUE DYE INJECTION RIGHT BREAST   MASTECTOMY W/ SENTINEL NODE BIOPSY Bilateral 06/05/2018   Procedure: BILATERAL MASTECTOMIES WITH RIGHT SENTINEL LYMPH NODE BIOPSY AND BLUE DYE INJECTION RIGHT BREAST;  Surgeon: Donnie Mesa, MD;  Location: [place];  Service: General;  Laterality: Bilateral;   SQUAMOUS CELL CARCINOMA EXCISION     "under my nose"   STRABISMUS SURGERY Bilateral 05/02/2018   Procedure: BILATERAL REPAIR STRABISMUS;  Surgeon: Lamonte Sakai, MD;  Location: [place];  Service: Ophthalmology;  Laterality: Bilateral;   TONSILLECTOMY AND ADENOIDECTOMY     UPPER GI ENDOSCOPY     WISDOM TOOTH EXTRACTION     History   Social History   Marital Status: Married    Spouse Name: N/A   Number of Children: 2   Occupational History    Psychologist, clinical   Social History Main Topics   Smoking status: Former Smoker    Types: Cigarettes    Quit date:  1986    Smokeless tobacco: Never Used   Alcohol  Use: Yes   Drug Use: No   Current Outpatient Medications on File Prior to Visit  Medication Sig Dispense Refill   benzonatate (TESSALON) 100 MG capsule Take 2 capsules (200 mg total) by mouth 2 (two) times daily as needed for cough. 20 capsule 0   cholecalciferol (VITAMIN D3) 25 MCG (1000 UNIT) tablet Take 1 tablet (1,000 Units total) by mouth daily.     desvenlafaxine (PRISTIQ) 50 MG 24 hr tablet Take 1 tablet (50 mg total) by mouth daily. 30 tablet 3   estradiol (ESTRACE) 0.1 MG/GM vaginal cream Place 1 Applicatorful vaginally 3 (three) times a week.     fluticasone (FLONASE) 50 MCG/ACT nasal spray Place into both nostrils as needed for allergies or rhinitis.     Hypromellose (ARTIFICIAL TEARS OP) Place 1 drop into both eyes daily in the afternoon.     LORazepam (ATIVAN) 0.5 MG tablet TAKE ONE TABLET BY MOUTH AT BEDTIME MAY TAKE EXTRA 1/2 TABLET AS NEEDED 45 tablet 5   losartan (COZAAR) 25 MG tablet Take 1 tablet (25 mg total) by mouth every evening. 30 tablet 1   losartan (COZAAR) 50 MG tablet Take 1 tablet (50 mg total) by mouth daily. 30 tablet 1   Magnesium 200 MG TABS Take 1 tablet by mouth daily.     meloxicam (MOBIC) 15 MG tablet TAKE 1 TABLET ONCE DAILY. 30 tablet 5   Multiple Vitamins-Minerals (PRESERVISION AREDS) CAPS Take 1 capsule by mouth 2 (two) times daily.      nitrofurantoin (MACRODANTIN) 50 MG capsule Take 1 capsule (50 mg total) by mouth daily as needed. 30 capsule 3   NON FORMULARY Take 1 capsule by mouth 2 (two)  times daily. Bio-Complete 3     NON FORMULARY ASEA REDOX 2 ox drink daily     NON FORMULARY D Mannose 2/day (1300 mg)     NON FORMULARY Symplex F 2 per day strengthen bone     Nutritional Supplements (CYTO-REDOXIN PO) Take 2 oz by mouth in the morning and at bedtime.     Omega-3 Fatty Acids (OMEGA 3 PO) Take 1 capsule by mouth daily.      tamoxifen (NOLVADEX) 10 MG tablet TAKE ONE TABLET BY MOUTH TWICE DAILY 90 tablet 3   Turmeric (QC TUMERIC COMPLEX PO) Take 5 mLs by mouth daily.     vitamin E 180 MG (400 UNITS) capsule Take 400 Units by mouth daily.     No current facility-administered medications on file prior to visit.   Allergies  Allergen Reactions   Bactrim [Sulfamethoxazole-Trimethoprim] Anaphylaxis, Hives and Rash   Family History  Problem Relation Age of Onset   Breast cancer Mother 61   Hypertension Father    Mental illness Sister    Osteoporosis Sister    Seizures Sister    Suicidality Sister    Cancer Maternal Grandfather        blood cancer?   Breast cancer Cousin        dx under 69   Cancer Paternal Aunt        type unk   Cancer Other        type unk   Cancer Son    PE: BP 124/82 (BP Location: Left Arm, Patient Position: Sitting, Cuff Size: Normal)   Pulse 90   Ht '5\' 4"'$  (1.626 m)   Wt 138 lb 9.6 oz (62.9 kg)   SpO2 97%   BMI 23.79 kg/m   Wt Readings from Last 3 Encounters:  05/22/22  138 lb 9.6 oz (62.9 kg)  04/09/22 140 lb (63.5 kg)  02/28/22 140 lb 6.4 oz (63.7 kg)   Constitutional: normal weight, in NAD Eyes:  EOMI, no exophthalmos ENT: no neck masses, no cervical lymphadenopathy Cardiovascular: RRR, No MRG Respiratory: CTA B Musculoskeletal: no deformities Skin:no rashes Neurological: + Head tremor and tremor with outstretched hands  Assessment: Hashimoto's hypothyroidism  2. Osteoporosis  3.  History of vitamin D deficiency  Plan: 1.  Hashimoto's hypothyroidism -This was diagnosed before our last visit and we discussed at that time  about autoimmune thyroiditis (her TPO antibodies were elevated) and thresholds for treatment. -We rechecked her TFTs at last visit and they were normal so she did not qualify for treatment. -We will recheck her TFTs now. -We again discussed about indications for treatment: Desire for pregnancy TSH higher than 10 TSH lower than 10 with signs or symptoms consistent with hypothyroidism -We briefly discussed about how to take levothyroxine in case we need to start:  Every day, with water, at least 30 minutes before breakfast, separated by at least 4 hours from: - acid reflux medications - calcium - iron - multivitamins -I we will see her back in a year.  2. Osteoporosis -Likely postmenopausal/age-related -Worsened after stopping the bisphosphonate.  Of note, she was tolerating this well. -No falls or fractures since last visit -Her bone density report from 2021 showed that the spine T score was much improved and the right femoral neck T score was also improved.  In 2021 she was diagnosed with breast cancer and started tamoxifen.  We discussed that this is actually beneficial for the bones as opposed to aromatase inhibitors, which are detrimental.  Since the T-scores were still quite low, I did suggest antiosteoporotic medication.  Since she had hiatal hernia and history of dysphagia, I did not suggest a bisphosphonate (she was on Actonel in the past).  We discussed about Prolia and she was open to try this at last visit - however, she ended up not starting since then.  At last visit I also suggested to continue exercise -she was doing weightbearing exercises consistently at that time.  She continues to exercise consistently between 11:50 in the morning including yoga, swimming, bands, strength exercises. -At today's visit we reviewed her most recent bone density from 03/2022 and the T-scores were approximately stable.  They are still low so I still would recommend Prolia.  After a discussion about  mechanism of action, benefits, and side effects, treatment plan, expected DXA scan results, patient agreed to try this. -Most recent calcium and kidney function levels were normal.  We will repeat these since we are planning to start Prolia.  3. H/o vit D def -Continues on 1000 units vitamin D daily -Latest vitamin D level from 6 months ago was 40.61, at goal -We will repeat this now  Component     Latest Ref Rng 05/22/2022  VITD     30.00 - 100.00 ng/mL 47.12   Glucose     70 - 99 mg/dL 98   BUN     6 - 23 mg/dL 24 (H)   Creatinine     0.40 - 1.20 mg/dL 0.99   Sodium     135 - 145 mEq/L 137   Potassium     3.5 - 5.1 mEq/L 4.1   Chloride     96 - 112 mEq/L 103   CO2     19 - 32 mEq/L 29   Calcium  8.4 - 10.5 mg/dL 9.5   TSH     0.35 - 5.50 uIU/mL 3.25   T4,Free(Direct)     0.60 - 1.60 ng/dL 0.62   GFR     >60.00 mL/min 56.50 (L)    Labs are okay.  We can submit an application to the Prolia portal.  Philemon Kingdom, MD PhD [facility] Endocrinology

## 2022-05-25 ENCOUNTER — Telehealth: Payer: Self-pay

## 2022-05-25 NOTE — Telephone Encounter (Signed)
Lisa Kingdom, MD  Jasper Loser, CMA [facility] Rama, Can you please submit this patient to the Prolia portal? Thank you! CG

## 2022-06-08 NOTE — Telephone Encounter (Signed)
Prolia VOB initiated via [email or web address] ? ?New start ? ?

## 2022-06-16 NOTE — Telephone Encounter (Signed)
Will check benefits and prior auth requirements via [employer]aility.

## 2022-06-19 NOTE — Telephone Encounter (Signed)
Attempted VOB via Availity, site it currently down.

## 2022-06-20 DIAGNOSIS — M5136 Other intervertebral disc degeneration, lumbar region: Secondary | ICD-10-CM | POA: Diagnosis not present

## 2022-06-20 DIAGNOSIS — M9903 Segmental and somatic dysfunction of lumbar region: Secondary | ICD-10-CM | POA: Diagnosis not present

## 2022-06-20 DIAGNOSIS — M9904 Segmental and somatic dysfunction of sacral region: Secondary | ICD-10-CM | POA: Diagnosis not present

## 2022-06-20 DIAGNOSIS — M9901 Segmental and somatic dysfunction of cervical region: Secondary | ICD-10-CM | POA: Diagnosis not present

## 2022-06-20 DIAGNOSIS — M9902 Segmental and somatic dysfunction of thoracic region: Secondary | ICD-10-CM | POA: Diagnosis not present

## 2022-06-21 NOTE — Telephone Encounter (Signed)
Prior Authorization initiated for PROLIA via [email or web address] KEY: [id number] - PA Case ID: [id number]

## 2022-06-25 NOTE — Telephone Encounter (Signed)
Lvm for pt advising PA for Prolia was denied and provider is ok with starting on Reclast. Requested pt call back to discuss further.

## 2022-06-25 NOTE — Telephone Encounter (Signed)
  Deniedon December 7 We cover this drug when our criteria are met. The unmet criteria for osteoporosis are: has a history of osteoporotic fracture OR has tried or cannot use zoledronic acid (step therapy requirement). This decision was from [employer]s Prolia (denosumab) Pharmacy Coverage Policy.

## 2022-06-25 NOTE — Telephone Encounter (Signed)
I am ok with her starting with Reclast - once a year, if she agrees.  Would expect approximately the same benefits as with Prolia, but is taken once a year, intravenously, in the outpatient infusion center.  Please let me know if I can order this.

## 2022-06-27 ENCOUNTER — Encounter: Payer: Self-pay | Admitting: Family

## 2022-06-27 ENCOUNTER — Telehealth: Payer: Self-pay

## 2022-06-27 ENCOUNTER — Telehealth (INDEPENDENT_AMBULATORY_CARE_PROVIDER_SITE_OTHER): Payer: Medicare PPO | Admitting: Family

## 2022-06-27 VITALS — Ht 64.0 in | Wt 138.0 lb

## 2022-06-27 DIAGNOSIS — U071 COVID-19: Secondary | ICD-10-CM | POA: Diagnosis not present

## 2022-06-27 DIAGNOSIS — J069 Acute upper respiratory infection, unspecified: Secondary | ICD-10-CM

## 2022-06-27 MED ORDER — NIRMATRELVIR/RITONAVIR (PAXLOVID)TABLET: 3.0000 | ORAL_TABLET | Freq: Two times a day (BID) | ORAL | 0 refills | Status: AC

## 2022-06-27 NOTE — Patient Instructions (Signed)
-   Take Zinc 50 mg tablet one by mouth daily for 14 days  - Take Vitamin C 500 mg tablet one by mouth twice daily x 14 days  - continue on vitamin D supplement  - Tylenol as needed for fever or body aches  - over the counter Mucinex as needed for cough  - increase your fruits intake in your diet  - increase your water intake to 6-8 glasses of water daily  - Notify provider or go to ED if you develop any chest tightness,chest pain or shortness of breath

## 2022-06-27 NOTE — Progress Notes (Signed)
This service is provided via telemedicine  No vital signs collected/recorded due to the encounter was a telemedicine visit.   Location of patient (ex: home, work):  home  Patient consents to a telephone visit:  telehealth consent dated 06/27/2022  Location of the provider (ex: office, home):  [facility]  Names of all persons participating in the telemedicine service and their role in the encounter:  patient, Lisa Yoder, Ciarrah Rae NP.  Time spent on call:  5 min   Provider: Jema Deegan FNP-C  Virgie Dad, MD  Patient Care Team: Virgie Dad, MD as PCP - General (Internal Medicine) Ronnette Juniper, MD as Consulting Physician (Gastroenterology) Phylliss Bob, MD as Consulting Physician (Orthopedic Surgery) Nicholas Lose, MD as Consulting Physician (Hematology and Oncology) Dian Queen, MD as Consulting Physician (Obstetrics and Gynecology) Marygrace Drought, MD as Consulting Physician (Ophthalmology) Leta Baptist, MD as Consulting Physician (Otolaryngology)  Extended Emergency Contact Information Primary Emergency Contact: Rashad, Obeid Mobile Phone: [phone] Relation: Daughter  Code Status:  DNR Goals of care: Advanced Directive information    02/28/2022    1:27 PM  Advanced Directives  Does Patient Have a Medical Advance Directive? Yes  Type of Paramedic of [place];Living will;Out of facility DNR (pink MOST or yellow form)  Does patient want to make changes to medical advance directive? No - Patient declined  Copy of [place] in Chart? Yes - validated most recent copy scanned in chart (See row information)     Chief Complaint  Patient presents with   Acute Visit    Patient is covid postive    HPI:  Pt is a 71 y.o. female seen today for an acute visit for evaluation of COVID-19 positive .States was at a party on Sunday and one man stated had  a virus infection but  did not mention whether COVID19 infection or not.Her symptoms started on Monday with fatigue,nasal congestion ,cough and sore throat. She denies any fever,chills,body aches,runny nose,chest tightness,chest pain,palpitation or shortness of breath.    Past Medical History:  Diagnosis Date   Anxiety    Arthritis    "back" (06/05/2018)   Breast cancer, right breast ([place]) 03/2018   Chronic lower back pain    "if I don't do yoga" (06/05/2018)   Depression    Family history of breast cancer    GERD (gastroesophageal reflux disease)    Herpes simplex    on nose   History of hiatal hernia    Macular degeneration    Per [facility] New Patient Packet    Nephritis    "as a child" (06/05/2018)   Osteoporosis    SCC (squamous cell carcinoma)    "under my nose" (06/05/2018)   Strabismus    Per [place] new patient packet    UTI (urinary tract infection) 8/15   Wears glasses    Past Surgical History:  Procedure Laterality Date   BREAST BIOPSY Right 04/2018   CATARACT EXTRACTION W/ INTRAOCULAR LENS  IMPLANT, BILATERAL Bilateral    CESAREAN SECTION  1977   COLONOSCOPY     CYST REMOVAL NECK Left 03/03/2014   Procedure: LEFT NECK CYST EXCISION;  Surgeon: Pedro Earls, MD;  Location: [place];  Service: General;  Laterality: Left;   DILATION AND CURETTAGE OF UTERUS     EYE SURGERY     Strabismus   MASTECTOMY Left 06/05/2018   MASTECTOMY COMPLETE / SIMPLE W/ SENTINEL NODE BIOPSY Right  06/05/2018   AND BLUE DYE INJECTION RIGHT BREAST   MASTECTOMY W/ SENTINEL NODE BIOPSY Bilateral 06/05/2018   Procedure: BILATERAL MASTECTOMIES WITH RIGHT SENTINEL LYMPH NODE BIOPSY AND BLUE DYE INJECTION RIGHT BREAST;  Surgeon: Donnie Mesa, MD;  Location: [place];  Service: General;  Laterality: Bilateral;   SQUAMOUS CELL CARCINOMA EXCISION     "under my nose"   STRABISMUS SURGERY Bilateral 05/02/2018   Procedure: BILATERAL REPAIR STRABISMUS;  Surgeon: Lamonte Sakai, MD;  Location: [place];  Service: Ophthalmology;  Laterality: Bilateral;   TONSILLECTOMY AND ADENOIDECTOMY     UPPER GI ENDOSCOPY     WISDOM TOOTH EXTRACTION      Allergies  Allergen Reactions   Bactrim [Sulfamethoxazole-Trimethoprim] Anaphylaxis, Hives and Rash    Outpatient Encounter Medications as of 06/27/2022  Medication Sig   benzonatate (TESSALON) 100 MG capsule Take 2 capsules (200 mg total) by mouth 2 (two) times daily as needed for cough.   cholecalciferol (VITAMIN D3) 25 MCG (1000 UNIT) tablet Take 1 tablet (1,000 Units total) by mouth daily.   desvenlafaxine (PRISTIQ) 50 MG 24 hr tablet Take 1 tablet (50 mg total) by mouth daily.   estradiol (ESTRACE) 0.1 MG/GM vaginal cream Place 1 Applicatorful vaginally 3 (three) times a week.   Hypromellose (ARTIFICIAL TEARS OP) Place 1 drop into both eyes daily in the afternoon.   LORazepam (ATIVAN) 0.5 MG tablet TAKE ONE TABLET BY MOUTH AT BEDTIME MAY TAKE EXTRA 1/2 TABLET AS NEEDED   losartan (COZAAR) 25 MG tablet Take 1 tablet (25 mg total) by mouth every evening.   losartan (COZAAR) 50 MG tablet Take 1 tablet (50 mg total) by mouth daily.   Magnesium 200 MG TABS Take 1 tablet by mouth daily.   meloxicam (MOBIC) 15 MG tablet TAKE 1 TABLET ONCE DAILY.   Multiple Vitamins-Minerals (PRESERVISION AREDS) CAPS Take 1 capsule by mouth 2 (two) times daily.    nitrofurantoin (MACRODANTIN) 50 MG capsule Take 1 capsule (50 mg total) by mouth daily as needed.   NON FORMULARY Take 1 capsule by mouth 2 (two) times daily. Bio-Complete 3   NON FORMULARY Symplex F 2 per day strengthen bone   Omega-3 Fatty Acids (OMEGA 3 PO) Take 1 capsule by mouth daily.    tamoxifen (NOLVADEX) 10 MG tablet TAKE ONE TABLET BY MOUTH TWICE DAILY   Turmeric (QC TUMERIC COMPLEX PO) Take 5 mLs by mouth daily.   No facility-administered encounter medications on file as of 06/27/2022.    Review of Systems  Constitutional:  Positive for fatigue. Negative for chills and fever.   HENT:  Positive for congestion and sore throat. Negative for rhinorrhea, sinus pressure, sinus pain and sneezing.   Respiratory:  Positive for cough. Negative for chest tightness, shortness of breath and wheezing.   Cardiovascular:  Negative for chest pain, palpitations and leg swelling.  Gastrointestinal:  Negative for abdominal distention, abdominal pain, constipation, diarrhea, nausea and vomiting.  Neurological:  Negative for dizziness and headaches.    Immunization History  Administered Date(s) Administered   DTaP 01/20/2007   Fluad Quad(high Dose 65+) 03/31/2019, 05/02/2022   Influenza Split 04/21/2015, 05/04/2017, 04/17/2018   Influenza, High Dose Seasonal PF 05/04/2017, 04/18/2018, 04/20/2021   Influenza-Unspecified 03/30/2011, 04/05/2020   Moderna SARS-COV2 Booster Vaccination 12/05/2020   Moderna Sars-Covid-2 Vaccination 07/20/2019, 08/17/2019, 05/30/2020   PFIZER(Purple Top)SARS-COV-2 Vaccination 03/16/2021   PPD Test 07/05/2016   Pfizer Covid-19 Vaccine Bivalent Booster 40yr & up 04/05/2021   Pneumococcal Conjugate-13 11/04/2015   Pneumococcal  Polysaccharide-23 11/05/2016   Tdap 11/04/2015   Zoster Recombinat (Shingrix) 01/22/2018, 03/28/2018   Pertinent  Health Maintenance Due  Topic Date Due   COLONOSCOPY (Pts 45-22yr Insurance coverage will need to be confirmed)  12/22/2029   INFLUENZA VACCINE  Completed   DEXA SCAN  Completed   MAMMOGRAM  Discontinued      10/09/2021    1:27 PM 11/22/2021    3:02 PM 01/09/2022    1:00 PM 02/27/2022    4:34 PM 02/28/2022    1:26 PM  [place] the past year? 0 0  0 1  Was there an injury with Fall? 0 0  0 0  Fall Risk Category Calculator 0 0  0 1  Fall Risk Category Low Low  Low Low  Patient Fall Risk Level Low fall risk Low fall risk Moderate fall risk Low fall risk Low fall risk  Patient at Risk for Falls Due to No Fall Risks No Fall Risks  No Fall Risks Impaired balance/gait  Fall risk Follow up Falls evaluation  completed Falls evaluation completed  Falls evaluation completed Falls evaluation completed   Functional Status Survey:    Vitals:   06/27/22 1513  Weight: 138 lb (62.6 kg)  Height: _0  (1.626 m)   Body mass index is 23.69 kg/m. Physical Exam Constitutional:      Appearance: She is not ill-appearing.  HENT:     Nose: Congestion present.  Pulmonary:     Effort: Pulmonary effort is normal. No respiratory distress.  Musculoskeletal:     Right lower leg: No edema.     Left lower leg: No edema.  Neurological:     Mental Status: She is alert and oriented to person, place, and time.     Gait: Gait normal.  Psychiatric:        Mood and Affect: Mood normal.        Behavior: Behavior normal.     Labs reviewed: Recent Labs    09/14/21 0810 05/22/22 1419  NA 138 137  K 4.5 4.1  CL 104 103  CO2 29 29  GLUCOSE 72 98  BUN 23 24*  CREATININE 0.89 0.99  CALCIUM 9.5 9.5   Recent Labs    09/14/21 0810  AST 16  ALT 14  BILITOT 0.5  PROT 6.5   Recent Labs    09/14/21 0810  WBC 4.8  NEUTROABS 2,136  HGB 12.4  HCT 37.3  MCV 88.0  PLT 221   Lab Results  Component Value Date   TSH 3.25 05/22/2022   Lab Results  Component Value Date   HGBA1C 5.6 09/14/2021   Lab Results  Component Value Date   CHOL 185 09/14/2021   HDL 71 09/14/2021   LDLCALC 91 09/14/2021   TRIG 124 09/14/2021   CHOLHDL 2.6 09/14/2021    Significant Diagnostic Results in last 30 days:  No results found.  Assessment/Plan 1. COVID-19 virus infection Tested positive on home Kit today was around someone with viral infection on Sunday unclear type of infection. Discussed starting on Paxlovid as below  - nirmatrelvir/ritonavir EUA (PAXLOVID) 20 x 150 MG & 10 x 100MG TABS; Take 3 tablets by mouth 2 (two) times daily for 5 days. (Take nirmatrelvir 150 mg two tablets twice daily for 5 days and ritonavir 100 mg one tablet twice daily for 5 days) Patient GFR is 56.50  Dispense: 30 tablet; Refill:  0  2. Upper respiratory tract infection due to COVID-19 virus -  Take Zinc 50 mg tablet one by mouth daily for 14 days  - Take Vitamin C 500 mg tablet one by mouth twice daily x 14 days  - continue on vitamin D supplement  - Tylenol as needed for fever or body aches  - over the counter Mucinex as needed for cough  - increase your fruits intake in your diet  - increase your water intake to 6-8 glasses of water daily  - Notify provider or go to ED if you develop any chest tightness,chest pain or shortness of breath  - nirmatrelvir/ritonavir EUA (PAXLOVID) 20 x 150 MG & 10 x 100MG TABS; Take 3 tablets by mouth 2 (two) times daily for 5 days. (Take nirmatrelvir 150 mg two tablets twice daily for 5 days and ritonavir 100 mg one tablet twice daily for 5 days) Patient GFR is 56.50  Dispense: 30 tablet; Refill: 0   Family/ staff Communication: Reviewed plan of care with patient verbalized understanding   Labs/tests ordered: None   Next Appointment: Return if symptoms worsen or fail to improve.  I connected with  Lisa Yoder on 06/27/22 by a video enabled telemedicine application and verified that I am speaking with the correct person using two identifiers.   I discussed the limitations of evaluation and management by telemedicine. The patient expressed understanding and agreed to proceed.  Spent 11 minutes of face to face with patient  >50% time spent counseling; reviewing medical record;  labs; and developing future plan of care.    Sandrea Hughs, NP

## 2022-06-27 NOTE — Telephone Encounter (Signed)
I connected with  Lisa Yoder on 06/27/22 by a video enabled telemedicine application and verified that I am speaking with the correct person using two identifiers.   I discussed the limitations of evaluation and management by telemedicine. The patient expressed understanding and agreed to proceed.

## 2022-07-18 DIAGNOSIS — H04123 Dry eye syndrome of bilateral lacrimal glands: Secondary | ICD-10-CM | POA: Diagnosis not present

## 2022-07-18 DIAGNOSIS — H524 Presbyopia: Secondary | ICD-10-CM | POA: Diagnosis not present

## 2022-07-18 DIAGNOSIS — H43813 Vitreous degeneration, bilateral: Secondary | ICD-10-CM | POA: Diagnosis not present

## 2022-07-18 DIAGNOSIS — H5000 Unspecified esotropia: Secondary | ICD-10-CM | POA: Diagnosis not present

## 2022-07-18 DIAGNOSIS — H52203 Unspecified astigmatism, bilateral: Secondary | ICD-10-CM | POA: Diagnosis not present

## 2022-07-18 DIAGNOSIS — H353132 Nonexudative age-related macular degeneration, bilateral, intermediate dry stage: Secondary | ICD-10-CM | POA: Diagnosis not present

## 2022-07-24 ENCOUNTER — Other Ambulatory Visit: Payer: Self-pay | Admitting: Internal Medicine

## 2022-07-30 DIAGNOSIS — M9904 Segmental and somatic dysfunction of sacral region: Secondary | ICD-10-CM | POA: Diagnosis not present

## 2022-07-30 DIAGNOSIS — M9901 Segmental and somatic dysfunction of cervical region: Secondary | ICD-10-CM | POA: Diagnosis not present

## 2022-07-30 DIAGNOSIS — M9902 Segmental and somatic dysfunction of thoracic region: Secondary | ICD-10-CM | POA: Diagnosis not present

## 2022-07-30 DIAGNOSIS — M9903 Segmental and somatic dysfunction of lumbar region: Secondary | ICD-10-CM | POA: Diagnosis not present

## 2022-07-30 DIAGNOSIS — M5136 Other intervertebral disc degeneration, lumbar region: Secondary | ICD-10-CM | POA: Diagnosis not present

## 2022-08-15 ENCOUNTER — Other Ambulatory Visit: Payer: Self-pay

## 2022-08-22 ENCOUNTER — Other Ambulatory Visit: Payer: Self-pay | Admitting: Internal Medicine

## 2022-08-22 DIAGNOSIS — F331 Major depressive disorder, recurrent, moderate: Secondary | ICD-10-CM

## 2022-08-22 DIAGNOSIS — I10 Essential (primary) hypertension: Secondary | ICD-10-CM

## 2022-08-28 DIAGNOSIS — L820 Inflamed seborrheic keratosis: Secondary | ICD-10-CM | POA: Diagnosis not present

## 2022-08-28 DIAGNOSIS — D225 Melanocytic nevi of trunk: Secondary | ICD-10-CM | POA: Diagnosis not present

## 2022-08-28 DIAGNOSIS — Z1283 Encounter for screening for malignant neoplasm of skin: Secondary | ICD-10-CM | POA: Diagnosis not present

## 2022-08-29 DIAGNOSIS — Z124 Encounter for screening for malignant neoplasm of cervix: Secondary | ICD-10-CM | POA: Diagnosis not present

## 2022-08-29 DIAGNOSIS — Z6824 Body mass index (BMI) 24.0-24.9, adult: Secondary | ICD-10-CM | POA: Diagnosis not present

## 2022-08-30 ENCOUNTER — Other Ambulatory Visit: Payer: Medicare PPO

## 2022-08-30 DIAGNOSIS — I10 Essential (primary) hypertension: Secondary | ICD-10-CM | POA: Diagnosis not present

## 2022-08-30 LAB — CBC WITH DIFFERENTIAL/PLATELET
Absolute Monocytes: 580 cells/uL (ref 200–950)
Basophils Absolute: 50 cells/uL (ref 0–200)
Basophils Relative: 1 %
Eosinophils Absolute: 180 cells/uL (ref 15–500)
Eosinophils Relative: 3.6 %
HCT: 35 % (ref 35.0–45.0)
Hemoglobin: 12.1 g/dL (ref 11.7–15.5)
Lymphs Abs: 1845 cells/uL (ref 850–3900)
MCH: 29.9 pg (ref 27.0–33.0)
MCHC: 34.6 g/dL (ref 32.0–36.0)
MCV: 86.4 fL (ref 80.0–100.0)
MPV: 11.1 fL (ref 7.5–12.5)
Monocytes Relative: 11.6 %
Neutro Abs: 2345 cells/uL (ref 1500–7800)
Neutrophils Relative %: 46.9 %
Platelets: 194 10*3/uL (ref 140–400)
RBC: 4.05 10*6/uL (ref 3.80–5.10)
RDW: 12.8 % (ref 11.0–15.0)
Total Lymphocyte: 36.9 %
WBC: 5 10*3/uL (ref 3.8–10.8)

## 2022-08-30 LAB — COMPLETE METABOLIC PANEL WITH GFR
AG Ratio: 1.6 (calc) (ref 1.0–2.5)
ALT: 13 U/L (ref 6–29)
AST: 17 U/L (ref 10–35)
Albumin: 3.9 g/dL (ref 3.6–5.1)
Alkaline phosphatase (APISO): 60 U/L (ref 37–153)
BUN: 19 mg/dL (ref 7–25)
CO2: 26 mmol/L (ref 20–32)
Calcium: 9.2 mg/dL (ref 8.6–10.4)
Chloride: 108 mmol/L (ref 98–110)
Creat: 0.93 mg/dL (ref 0.60–1.00)
Globulin: 2.4 g/dL (calc) (ref 1.9–3.7)
Glucose, Bld: 86 mg/dL (ref 65–99)
Potassium: 4.5 mmol/L (ref 3.5–5.3)
Sodium: 141 mmol/L (ref 135–146)
Total Bilirubin: 0.5 mg/dL (ref 0.2–1.2)
Total Protein: 6.3 g/dL (ref 6.1–8.1)
eGFR: 65 mL/min/{1.73_m2} (ref >=60)

## 2022-08-30 LAB — LIPID PANEL
Cholesterol: 169 mg/dL (ref <200)
HDL: 68 mg/dL (ref >=50)
LDL Cholesterol (Calc): 80 mg/dL (calc)
Non-HDL Cholesterol (Calc): 101 mg/dL (calc) (ref <130)
Total CHOL/HDL Ratio: 2.5 (calc) (ref <5.0)
Triglycerides: 113 mg/dL (ref <150)

## 2022-09-05 ENCOUNTER — Non-Acute Institutional Stay: Payer: Medicare PPO | Admitting: Internal Medicine

## 2022-09-05 ENCOUNTER — Encounter: Payer: Self-pay | Admitting: Internal Medicine

## 2022-09-05 ENCOUNTER — Encounter: Payer: Medicare PPO | Admitting: Internal Medicine

## 2022-09-05 VITALS — BP 142/84 | HR 90 | Temp 97.6°F | Resp 17 | Ht 64.0 in | Wt 138.8 lb

## 2022-09-05 DIAGNOSIS — R911 Solitary pulmonary nodule: Secondary | ICD-10-CM | POA: Diagnosis not present

## 2022-09-05 DIAGNOSIS — I10 Essential (primary) hypertension: Secondary | ICD-10-CM | POA: Diagnosis not present

## 2022-09-05 DIAGNOSIS — M533 Sacrococcygeal disorders, not elsewhere classified: Secondary | ICD-10-CM

## 2022-09-05 DIAGNOSIS — E038 Other specified hypothyroidism: Secondary | ICD-10-CM

## 2022-09-05 DIAGNOSIS — N390 Urinary tract infection, site not specified: Secondary | ICD-10-CM | POA: Diagnosis not present

## 2022-09-05 DIAGNOSIS — F331 Major depressive disorder, recurrent, moderate: Secondary | ICD-10-CM | POA: Diagnosis not present

## 2022-09-05 NOTE — Progress Notes (Signed)
Location:  [facility] (12)  Provider:   Code Status:  Goals of Care:     09/05/2022   10:17 AM  Advanced Directives  Does Patient Have a Medical Advance Directive? Yes  Does patient want to make changes to medical advance directive? No - Patient declined     Chief Complaint  Patient presents with   Medical Management of Chronic Issues    6 month follow up   Quality Metric Gaps    Discussed the need for Hep c screening and AWv    HPI: Patient is a 72 y.o. female seen today for medical management of chronic diseases.    Lives in [place] in [facility]  Her active issues include Bilateral mastectomy for right breast invasive lobular cancer Follows with Dr. Sonny Dandy and is on tamoxifen since 2019. Sees oncology Annually Recurent UTI Using Estrogen cream and uses Macrodantin Prn with Sexual activity Also on Cranberry Follows with Dr Helane Rima PRN for treatment  Depression and Anxiety Takes Ativan and Pristiq Failed Ativan Taper  Low back pain  Takes Meloxiam Wears a brace and works with Restaurant manager, fast food  Was seen by Cassie Freer Diagnosed with Right Sacroiliac Joint dysfunction Underwent CT guided Injection which was successful and doing well Hypertension  Past Medical History:  Diagnosis Date   Anxiety    Arthritis    "back" (06/05/2018)   Breast cancer, right breast ([place]) 03/2018   Chronic lower back pain    "if I don't do yoga" (06/05/2018)   Depression    Family history of breast cancer    GERD (gastroesophageal reflux disease)    Herpes simplex    on nose   History of hiatal hernia    Macular degeneration    Per [facility] New Patient Packet    Nephritis    "as a child" (06/05/2018)   Osteoporosis    SCC (squamous cell carcinoma)    "under my nose" (06/05/2018)   Strabismus    Per [place] new patient packet    UTI (urinary tract infection) 8/15   Wears glasses     Past Surgical History:  Procedure Laterality Date   BREAST BIOPSY  Right 04/2018   CATARACT EXTRACTION W/ INTRAOCULAR LENS  IMPLANT, BILATERAL Bilateral    CESAREAN SECTION  1977   COLONOSCOPY     CYST REMOVAL NECK Left 03/03/2014   Procedure: LEFT NECK CYST EXCISION;  Surgeon: Pedro Earls, MD;  Location: [place];  Service: General;  Laterality: Left;   DILATION AND CURETTAGE OF UTERUS     EYE SURGERY     Strabismus   MASTECTOMY Left 06/05/2018   MASTECTOMY COMPLETE / SIMPLE W/ SENTINEL NODE BIOPSY Right 06/05/2018   AND BLUE DYE INJECTION RIGHT BREAST   MASTECTOMY W/ SENTINEL NODE BIOPSY Bilateral 06/05/2018   Procedure: BILATERAL MASTECTOMIES WITH RIGHT SENTINEL LYMPH NODE BIOPSY AND BLUE DYE INJECTION RIGHT BREAST;  Surgeon: Donnie Mesa, MD;  Location: [place];  Service: General;  Laterality: Bilateral;   SQUAMOUS CELL CARCINOMA EXCISION     "under my nose"   STRABISMUS SURGERY Bilateral 05/02/2018   Procedure: BILATERAL REPAIR STRABISMUS;  Surgeon: Lamonte Sakai, MD;  Location: [place];  Service: Ophthalmology;  Laterality: Bilateral;   TONSILLECTOMY AND ADENOIDECTOMY     UPPER GI ENDOSCOPY     WISDOM TOOTH EXTRACTION      Allergies  Allergen Reactions   Bactrim [Sulfamethoxazole-Trimethoprim] Anaphylaxis, Hives and Rash  Outpatient Encounter Medications as of 09/05/2022  Medication Sig   cholecalciferol (VITAMIN D3) 25 MCG (1000 UNIT) tablet Take 1 tablet (1,000 Units total) by mouth daily.   desvenlafaxine (PRISTIQ) 50 MG 24 hr tablet Take 1 tablet (50 mg total) by mouth daily.   estradiol (ESTRACE) 0.1 MG/GM vaginal cream Place 1 Applicatorful vaginally 3 (three) times a week.   Hypromellose (ARTIFICIAL TEARS OP) Place 1 drop into both eyes daily in the afternoon.   LORazepam (ATIVAN) 0.5 MG tablet TAKE ONE TABLET BY MOUTH AT BEDTIME MAY TAKE EXTRA 1/2 TABLET AS NEEDED   losartan (COZAAR) 25 MG tablet Take 1 tablet (25 mg total) by mouth every evening.   losartan (COZAAR) 50 MG tablet Take 1 tablet  (50 mg total) by mouth daily.   Magnesium 200 MG TABS Take 1 tablet by mouth daily.   meloxicam (MOBIC) 15 MG tablet TAKE ONE TABLET BY MOUTH ONCE DAILY FOR INFLAMMATION   Multiple Vitamins-Minerals (PRESERVISION AREDS) CAPS Take 1 capsule by mouth 2 (two) times daily.    nitrofurantoin (MACRODANTIN) 50 MG capsule Take 1 capsule (50 mg total) by mouth daily as needed.   NON FORMULARY Take 1 capsule by mouth 2 (two) times daily. Bio-Complete 3   NON FORMULARY Symplex F 2 per day strengthen bone   Omega-3 Fatty Acids (OMEGA 3 PO) Take 1 capsule by mouth daily.    tamoxifen (NOLVADEX) 10 MG tablet TAKE ONE TABLET BY MOUTH TWICE DAILY   Turmeric (QC TUMERIC COMPLEX PO) Take 5 mLs by mouth daily.   [DISCONTINUED] benzonatate (TESSALON) 100 MG capsule Take 2 capsules (200 mg total) by mouth 2 (two) times daily as needed for cough.   No facility-administered encounter medications on file as of 09/05/2022.    Review of Systems:  Review of Systems  Constitutional:  Negative for activity change and appetite change.  HENT: Negative.    Respiratory:  Negative for cough and shortness of breath.   Cardiovascular:  Negative for leg swelling.  Gastrointestinal:  Negative for constipation.  Genitourinary: Negative.   Musculoskeletal:  Negative for arthralgias, gait problem and myalgias.  Skin: Negative.   Neurological:  Negative for dizziness and weakness.  Psychiatric/Behavioral:  Negative for confusion, dysphoric mood and sleep disturbance. The patient is nervous/anxious.     Health Maintenance  Topic Date Due   Medicare Annual Wellness (AWV)  Never done   Hepatitis C Screening  Never done   COVID-19 Vaccine (6 - 2023-24 season) 07/17/2022   DTaP/Tdap/Td (3 - Td or Tdap) 11/03/2025   COLONOSCOPY (Pts 45-82yr Insurance coverage will need to be confirmed)  12/22/2029   Pneumonia Vaccine 72 Years old  Completed   INFLUENZA VACCINE  Completed   DEXA SCAN  Completed   Zoster Vaccines- Shingrix   Completed   HPV VACCINES  Aged Out   MAMMOGRAM  Discontinued    Physical Exam: Vitals:   09/05/22 1014 09/05/22 1018  BP: (!) 148/82 (!) 142/84  Pulse: 90   Resp: 17   Temp: 97.6 F (36.4 C)   TempSrc: Temporal   SpO2: 96%   Weight: 138 lb 12.8 oz (63 kg)   Height: 5' 4"$  (1.626 m)    Body mass index is 23.82 kg/m. Physical Exam Vitals reviewed.  Constitutional:      Appearance: Normal appearance.  HENT:     Head: Normocephalic.     Nose: Nose normal.     Mouth/Throat:     Mouth: Mucous membranes are moist.  Pharynx: Oropharynx is clear.  Eyes:     Pupils: Pupils are equal, round, and reactive to light.  Cardiovascular:     Rate and Rhythm: Normal rate and regular rhythm.     Pulses: Normal pulses.     Heart sounds: Normal heart sounds. No murmur heard. Pulmonary:     Effort: Pulmonary effort is normal.     Breath sounds: Normal breath sounds.  Abdominal:     General: Abdomen is flat. Bowel sounds are normal.     Palpations: Abdomen is soft.  Musculoskeletal:        General: No swelling.     Cervical back: Neck supple.  Skin:    General: Skin is warm.  Neurological:     General: No focal deficit present.     Mental Status: She is alert and oriented to person, place, and time.  Psychiatric:        Mood and Affect: Mood normal.        Thought Content: Thought content normal.     Labs reviewed: Basic Metabolic Panel: Recent Labs    09/14/21 0810 10/02/21 0800 11/16/21 1151 05/22/22 1419 08/30/22 0811  NA 138  --   --  137 141  K 4.5  --   --  4.1 4.5  CL 104  --   --  103 108  CO2 29  --   --  29 26  GLUCOSE 72  --   --  98 86  BUN 23  --   --  24* 19  CREATININE 0.89  --   --  0.99 0.93  CALCIUM 9.5  --   --  9.5 9.2  TSH 5.56* 6.06* 5.27 3.25  --    Liver Function Tests: Recent Labs    09/14/21 0810 08/30/22 0811  AST 16 17  ALT 14 13  BILITOT 0.5 0.5  PROT 6.5 6.3   No results for input(s): "LIPASE", "AMYLASE" in the last 8760  hours. No results for input(s): "AMMONIA" in the last 8760 hours. CBC: Recent Labs    09/14/21 0810 08/30/22 0811  WBC 4.8 5.0  NEUTROABS 2,136 2,345  HGB 12.4 12.1  HCT 37.3 35.0  MCV 88.0 86.4  PLT 221 194   Lipid Panel: Recent Labs    09/14/21 0810 08/30/22 0811  CHOL 185 169  HDL 71 68  LDLCALC 91 80  TRIG 124 113  CHOLHDL 2.6 2.5   Lab Results  Component Value Date   HGBA1C 5.6 09/14/2021    Procedures since last visit: No results found.  Assessment/Plan 1. Pulmonary nodule - - CT Chest Wo Contrast; Future  2. Essential hypertension BP Usually good Will check it at home I have told her to increase her Cozaar if SBP more then 140   - Lipid panel - COMPLETE METABOLIC PANEL WITH GFR - CBC with Differential/Platelet  3. Moderate episode of recurrent major depressive disorder (HCC) Doing well on Ativan and Pristiq  4. Subclinical hypothyroidism Follows with Dr Renne Crigler  5. Recurrent UTI Macrodantin, Estrogen, Cranberry  6. SI (sacroiliac) joint dysfunction Meloxicam  7 Osteoporosis Prolia recommended by Dr Renne Crigler Has not started yet   Labs/tests ordered:  * No order type specified * Next appt:  02/28/2023

## 2022-09-10 DIAGNOSIS — M9904 Segmental and somatic dysfunction of sacral region: Secondary | ICD-10-CM | POA: Diagnosis not present

## 2022-09-10 DIAGNOSIS — M9902 Segmental and somatic dysfunction of thoracic region: Secondary | ICD-10-CM | POA: Diagnosis not present

## 2022-09-10 DIAGNOSIS — M9903 Segmental and somatic dysfunction of lumbar region: Secondary | ICD-10-CM | POA: Diagnosis not present

## 2022-09-10 DIAGNOSIS — M5136 Other intervertebral disc degeneration, lumbar region: Secondary | ICD-10-CM | POA: Diagnosis not present

## 2022-09-10 DIAGNOSIS — M9901 Segmental and somatic dysfunction of cervical region: Secondary | ICD-10-CM | POA: Diagnosis not present

## 2022-10-02 ENCOUNTER — Other Ambulatory Visit: Payer: Self-pay | Admitting: Orthopedic Surgery

## 2022-10-02 NOTE — Telephone Encounter (Signed)
Patient is requesting a refill of the following medications: Requested Prescriptions   Pending Prescriptions Disp Refills   LORazepam (ATIVAN) 0.5 MG tablet [Pharmacy Med Name: lorazepam 0.5 mg tablet] 45 tablet 5    Sig: TAKE ONE TABLET BY MOUTH AT BEDTIME MAY TAKE EXTRA 1/2 TABLET AS NEEDED    Date of last refill:04/04/22  Refill amount: 45/5 refills  Treatment agreement date: Not on file, notation made on pending appointment for August 2024

## 2022-10-09 ENCOUNTER — Ambulatory Visit
Admission: RE | Admit: 2022-10-09 | Discharge: 2022-10-09 | Disposition: A | Discharge status: Home / Self Care | Payer: Medicare PPO | Source: Ambulatory Visit | Attending: Internal Medicine | Admitting: Internal Medicine

## 2022-10-09 DIAGNOSIS — R911 Solitary pulmonary nodule: Secondary | ICD-10-CM | POA: Diagnosis not present

## 2022-10-09 DIAGNOSIS — J479 Bronchiectasis, uncomplicated: Secondary | ICD-10-CM | POA: Diagnosis not present

## 2022-10-09 DIAGNOSIS — I700 Atherosclerosis of aorta: Secondary | ICD-10-CM | POA: Diagnosis not present

## 2022-10-09 DIAGNOSIS — R918 Other nonspecific abnormal finding of lung field: Secondary | ICD-10-CM | POA: Diagnosis not present

## 2022-10-22 ENCOUNTER — Other Ambulatory Visit: Payer: Self-pay | Admitting: Hematology and Oncology

## 2022-10-29 DIAGNOSIS — M9903 Segmental and somatic dysfunction of lumbar region: Secondary | ICD-10-CM | POA: Diagnosis not present

## 2022-10-29 DIAGNOSIS — M9901 Segmental and somatic dysfunction of cervical region: Secondary | ICD-10-CM | POA: Diagnosis not present

## 2022-10-29 DIAGNOSIS — M9904 Segmental and somatic dysfunction of sacral region: Secondary | ICD-10-CM | POA: Diagnosis not present

## 2022-10-29 DIAGNOSIS — M9902 Segmental and somatic dysfunction of thoracic region: Secondary | ICD-10-CM | POA: Diagnosis not present

## 2022-11-19 DIAGNOSIS — M9903 Segmental and somatic dysfunction of lumbar region: Secondary | ICD-10-CM | POA: Diagnosis not present

## 2022-11-19 DIAGNOSIS — M9902 Segmental and somatic dysfunction of thoracic region: Secondary | ICD-10-CM | POA: Diagnosis not present

## 2022-11-19 DIAGNOSIS — M9904 Segmental and somatic dysfunction of sacral region: Secondary | ICD-10-CM | POA: Diagnosis not present

## 2022-11-19 DIAGNOSIS — M9901 Segmental and somatic dysfunction of cervical region: Secondary | ICD-10-CM | POA: Diagnosis not present

## 2022-11-27 ENCOUNTER — Other Ambulatory Visit: Payer: Self-pay | Admitting: Internal Medicine

## 2022-11-27 NOTE — Telephone Encounter (Signed)
Will send refill request to a provider to review and approve if necessary

## 2022-12-07 DIAGNOSIS — M9901 Segmental and somatic dysfunction of cervical region: Secondary | ICD-10-CM | POA: Diagnosis not present

## 2022-12-07 DIAGNOSIS — M9902 Segmental and somatic dysfunction of thoracic region: Secondary | ICD-10-CM | POA: Diagnosis not present

## 2022-12-07 DIAGNOSIS — M9904 Segmental and somatic dysfunction of sacral region: Secondary | ICD-10-CM | POA: Diagnosis not present

## 2022-12-07 DIAGNOSIS — M9903 Segmental and somatic dysfunction of lumbar region: Secondary | ICD-10-CM | POA: Diagnosis not present

## 2022-12-11 ENCOUNTER — Other Ambulatory Visit: Payer: Self-pay

## 2022-12-11 MED ORDER — LOSARTAN POTASSIUM 25 MG PO TABS: 25.0000 mg | ORAL_TABLET | Freq: Every evening | ORAL | 1 refills | Status: DC

## 2022-12-12 DIAGNOSIS — M9904 Segmental and somatic dysfunction of sacral region: Secondary | ICD-10-CM | POA: Diagnosis not present

## 2022-12-12 DIAGNOSIS — M9902 Segmental and somatic dysfunction of thoracic region: Secondary | ICD-10-CM | POA: Diagnosis not present

## 2022-12-12 DIAGNOSIS — M9901 Segmental and somatic dysfunction of cervical region: Secondary | ICD-10-CM | POA: Diagnosis not present

## 2022-12-12 DIAGNOSIS — M9903 Segmental and somatic dysfunction of lumbar region: Secondary | ICD-10-CM | POA: Diagnosis not present

## 2022-12-14 DIAGNOSIS — M9902 Segmental and somatic dysfunction of thoracic region: Secondary | ICD-10-CM | POA: Diagnosis not present

## 2022-12-14 DIAGNOSIS — M9904 Segmental and somatic dysfunction of sacral region: Secondary | ICD-10-CM | POA: Diagnosis not present

## 2022-12-14 DIAGNOSIS — M9901 Segmental and somatic dysfunction of cervical region: Secondary | ICD-10-CM | POA: Diagnosis not present

## 2022-12-14 DIAGNOSIS — M9903 Segmental and somatic dysfunction of lumbar region: Secondary | ICD-10-CM | POA: Diagnosis not present

## 2022-12-18 DIAGNOSIS — Z9012 Acquired absence of left breast and nipple: Secondary | ICD-10-CM | POA: Diagnosis not present

## 2022-12-18 DIAGNOSIS — C50911 Malignant neoplasm of unspecified site of right female breast: Secondary | ICD-10-CM | POA: Diagnosis not present

## 2022-12-21 DIAGNOSIS — M9901 Segmental and somatic dysfunction of cervical region: Secondary | ICD-10-CM | POA: Diagnosis not present

## 2022-12-21 DIAGNOSIS — M9904 Segmental and somatic dysfunction of sacral region: Secondary | ICD-10-CM | POA: Diagnosis not present

## 2022-12-21 DIAGNOSIS — M9902 Segmental and somatic dysfunction of thoracic region: Secondary | ICD-10-CM | POA: Diagnosis not present

## 2022-12-21 DIAGNOSIS — M9903 Segmental and somatic dysfunction of lumbar region: Secondary | ICD-10-CM | POA: Diagnosis not present

## 2022-12-27 ENCOUNTER — Other Ambulatory Visit: Payer: Self-pay | Admitting: Internal Medicine

## 2022-12-27 DIAGNOSIS — I10 Essential (primary) hypertension: Secondary | ICD-10-CM

## 2022-12-27 NOTE — Telephone Encounter (Signed)
High Risk Warning Populated when attempting to refill, I will send to Provider for further review 

## 2023-01-02 DIAGNOSIS — B9689 Other specified bacterial agents as the cause of diseases classified elsewhere: Secondary | ICD-10-CM | POA: Diagnosis not present

## 2023-01-02 DIAGNOSIS — L820 Inflamed seborrheic keratosis: Secondary | ICD-10-CM | POA: Diagnosis not present

## 2023-01-02 DIAGNOSIS — L258 Unspecified contact dermatitis due to other agents: Secondary | ICD-10-CM | POA: Diagnosis not present

## 2023-01-02 DIAGNOSIS — L0202 Furuncle of face: Secondary | ICD-10-CM | POA: Diagnosis not present

## 2023-01-06 NOTE — Progress Notes (Signed)
Patient Care Team: Mahlon Gammon, MD as PCP - General (Internal Medicine) Kerin Salen, MD as Consulting Physician (Gastroenterology) Estill Bamberg, MD as Consulting Physician (Orthopedic Surgery) Serena Croissant, MD as Consulting Physician (Hematology and Oncology) Marcelle Overlie, MD as Consulting Physician (Obstetrics and Gynecology) Janet Berlin, MD as Consulting Physician (Ophthalmology) Newman Pies, MD as Consulting Physician (Otolaryngology)  DIAGNOSIS: No diagnosis found.  SUMMARY OF ONCOLOGIC HISTORY: Oncology History  Malignant neoplasm of upper-outer quadrant of right breast in female, estrogen receptor positive (HCC)  05/05/2018 Initial Diagnosis   Screening detected right breast mass, MRI revealed UIQ 1.5 x 0.8 x 0.8 cm mass.  Additional masses 5 mm size and 7 mm size and several smaller enhancing masses in the right breast, biopsy of the 1.5 cm mass invasive lobular cancer with LCIS ER 20%, PR 20%, Ki-67 less than 1%, HER-2 negative; biopsy of additional masses showed ALH and ILC with LCIS ER 100%, PR 70%, Ki-67 5%, HER-2 1+ negative   05/08/2018 -  Anti-estrogen oral therapy   Tamoxifen, plan 5-10 years   06/05/2018 Surgery   Right mastectomy: Invasive lobular carcinoma, multifocal, 1.2 cm is the largest tumor, grade 1, margins negative, 0/1 lymph node, ER 100%, PR 70%, HER-2 -1+, Ki-67 5% T1c N0 stage Ia Left mastectomy: Benign   06/18/2018 Cancer Staging   Staging form: Breast, AJCC 8th Edition - Pathologic: Stage IA (pT1c(m), pN0, cM0, G1, ER+, PR+, HER2-) - Signed by Loa Socks, NP on 06/18/2018   06/19/2018 Oncotype testing   Oncotype: score of 11 with 3% chance of distant recurrence in 9 years with tamoxifen alone     CHIEF COMPLIANT:   INTERVAL HISTORY: Lisa Yoder is a   ALLERGIES:  is allergic to bactrim [sulfamethoxazole-trimethoprim].  MEDICATIONS:  Current Outpatient Medications  Medication Sig Dispense Refill    cholecalciferol (VITAMIN D3) 25 MCG (1000 UNIT) tablet Take 1 tablet (1,000 Units total) by mouth daily.     desvenlafaxine (PRISTIQ) 50 MG 24 hr tablet Take 1 tablet (50 mg total) by mouth daily. 30 tablet 3   estradiol (ESTRACE) 0.1 MG/GM vaginal cream Place 1 Applicatorful vaginally 3 (three) times a week.     Hypromellose (ARTIFICIAL TEARS OP) Place 1 drop into both eyes daily in the afternoon.     LORazepam (ATIVAN) 0.5 MG tablet TAKE ONE TABLET BY MOUTH AT BEDTIME MAY TAKE EXTRA 1/2 TABLET AS NEEDED 45 tablet 5   losartan (COZAAR) 25 MG tablet Take 1 tablet (25 mg total) by mouth every evening. 90 tablet 1   losartan (COZAAR) 50 MG tablet Take 1 tablet (50 mg total) by mouth daily. 30 tablet 1   Magnesium 200 MG TABS Take 1 tablet by mouth daily.     meloxicam (MOBIC) 15 MG tablet TAKE ONE TABLET BY MOUTH ONCE DAILY FOR INFLAMMATION 60 tablet 3   Multiple Vitamins-Minerals (PRESERVISION AREDS) CAPS Take 1 capsule by mouth 2 (two) times daily.      nitrofurantoin (MACRODANTIN) 50 MG capsule TAKE ONE CAPSULE BY MOUTH DAILY AS NEEDED 30 capsule 5   NON FORMULARY Take 1 capsule by mouth 2 (two) times daily. Bio-Complete 3     NON FORMULARY Symplex F 2 per day strengthen bone     Omega-3 Fatty Acids (OMEGA 3 PO) Take 1 capsule by mouth daily.      tamoxifen (NOLVADEX) 10 MG tablet TAKE ONE TABLET BY MOUTH TWICE DAILY 90 tablet 3   Turmeric (QC TUMERIC COMPLEX PO) Take 5 mLs  by mouth daily.     No current facility-administered medications for this visit.    PHYSICAL EXAMINATION: ECOG PERFORMANCE STATUS: {CHL ONC ECOG [phone]}  There were no vitals filed for this visit. There were no vitals filed for this visit.  BREAST:*** No palpable masses or nodules in either right or left breasts. No palpable axillary supraclavicular or infraclavicular adenopathy no breast tenderness or nipple discharge. (exam performed in the presence of a chaperone)  LABORATORY DATA:  I have reviewed the data  as listed    Latest Ref Rng & Units 08/30/2022    8:11 AM 05/22/2022    2:19 PM 09/14/2021    8:10 AM  CMP  Glucose 65 - 99 mg/dL 86  98  72   BUN 7 - 25 mg/dL 19  24  23    Creatinine 0.60 - 1.00 mg/dL 4.09  8.11  9.14   Sodium 135 - 146 mmol/L 141  137  138   Potassium 3.5 - 5.3 mmol/L 4.5  4.1  4.5   Chloride 98 - 110 mmol/L 108  103  104   CO2 20 - 32 mmol/L 26  29  29    Calcium 8.6 - 10.4 mg/dL 9.2  9.5  9.5   Total Protein 6.1 - 8.1 g/dL 6.3   6.5   Total Bilirubin 0.2 - 1.2 mg/dL 0.5   0.5   AST 10 - 35 U/L 17   16   ALT 6 - 29 U/L 13   14     Lab Results  Component Value Date   WBC 5.0 08/30/2022   HGB 12.1 08/30/2022   HCT 35.0 08/30/2022   MCV 86.4 08/30/2022   PLT 194 08/30/2022   NEUTROABS 2,345 08/30/2022    ASSESSMENT & PLAN:  No problem-specific Assessment & Plan notes found for this encounter.    No orders of the defined types were placed in this encounter.  The patient has a good understanding of the overall plan. she agrees with it. she will call with any problems that may develop before the next visit here. Total time spent: 30 mins including face to face time and time spent for planning, charting and co-ordination of care   Sherlyn Lick, CMA 01/06/23    I Janan Ridge am acting as a Neurosurgeon for [employer]  ***

## 2023-01-10 ENCOUNTER — Inpatient Hospital Stay: Payer: Medicare PPO | Attending: Hematology and Oncology | Admitting: Hematology and Oncology

## 2023-01-10 ENCOUNTER — Other Ambulatory Visit: Payer: Self-pay

## 2023-01-10 VITALS — BP 144/69 | HR 94 | Temp 97.7°F | Resp 18 | Ht 64.0 in | Wt 140.9 lb

## 2023-01-10 DIAGNOSIS — Z170 Estrogen receptor positive status [ER+]: Secondary | ICD-10-CM | POA: Diagnosis not present

## 2023-01-10 DIAGNOSIS — Z79810 Long term (current) use of selective estrogen receptor modulators (SERMs): Secondary | ICD-10-CM | POA: Insufficient documentation

## 2023-01-10 DIAGNOSIS — C50411 Malignant neoplasm of upper-outer quadrant of right female breast: Secondary | ICD-10-CM | POA: Insufficient documentation

## 2023-01-10 NOTE — Assessment & Plan Note (Addendum)
06/05/2018: Bilateral mastectomies: Right mastectomy: Invasive lobular carcinoma, multifocal, 1.2 cm is the largest tumor, grade 1, margins negative, 0/1 lymph node, ER 100%, PR 70%, HER-2 -1+, Ki-67 5% T1c N0 stage Ia Left mastectomy: Benign Oncotype DX: Score 11, ROR 3%   Current treatment: Tamoxifen started January 2020 patient is able to tolerate 10 mg only (plan: 10 years) Tamoxifen toxicities:   1.  Hot flashes have become more manageable 2. Frequent UTIs: Topical Estrace cream twice a day is helping   Breast cancer surveillance: 1.  Chest wall examination 06/29/2020: No palpable lumps or nodules. patient had previous bilateral mastectomies. 2.  No role of imaging studies   We would like to send for breast cancer index to determine if she would benefit from extended endocrine therapy beyond 5 years.  I will call her with the result of this test.  Return to clinic in 1 year for follow-ups.

## 2023-01-10 NOTE — Progress Notes (Signed)
Signed Breast Cancer Index form along with all pathology reports, office note, demographics, and insurance card faxed to [phone] with receipt confirmation. Follow up telephone appt made in 3 weeks, Pt verbalized understanding.

## 2023-01-11 ENCOUNTER — Telehealth: Payer: Self-pay | Admitting: Hematology and Oncology

## 2023-01-11 NOTE — Telephone Encounter (Signed)
Scheduled appointment per 6/27 los. Patient is aware of the appointment.

## 2023-01-14 DIAGNOSIS — C50911 Malignant neoplasm of unspecified site of right female breast: Secondary | ICD-10-CM | POA: Diagnosis not present

## 2023-01-14 DIAGNOSIS — Z9012 Acquired absence of left breast and nipple: Secondary | ICD-10-CM | POA: Diagnosis not present

## 2023-01-21 DIAGNOSIS — M9901 Segmental and somatic dysfunction of cervical region: Secondary | ICD-10-CM | POA: Diagnosis not present

## 2023-01-21 DIAGNOSIS — M9903 Segmental and somatic dysfunction of lumbar region: Secondary | ICD-10-CM | POA: Diagnosis not present

## 2023-01-21 DIAGNOSIS — M9904 Segmental and somatic dysfunction of sacral region: Secondary | ICD-10-CM | POA: Diagnosis not present

## 2023-01-21 DIAGNOSIS — M9902 Segmental and somatic dysfunction of thoracic region: Secondary | ICD-10-CM | POA: Diagnosis not present

## 2023-01-24 ENCOUNTER — Telehealth: Payer: Self-pay

## 2023-01-24 DIAGNOSIS — Z170 Estrogen receptor positive status [ER+]: Secondary | ICD-10-CM | POA: Diagnosis not present

## 2023-01-24 DIAGNOSIS — C50411 Malignant neoplasm of upper-outer quadrant of right female breast: Secondary | ICD-10-CM | POA: Diagnosis not present

## 2023-01-24 NOTE — Telephone Encounter (Signed)
Called and LVM with Breast Cancer Index results. BCI results reveal Pt would NOT benefit from extended therapy and risk of late distant recurrence (5-10 years) is 1.7%. MD recommends Pt stop tamoxifen after 5 years (Pt started tamoxifen Jan. 2020). Gave call back number with any questions.

## 2023-01-25 ENCOUNTER — Encounter: Payer: Self-pay | Admitting: Hematology and Oncology

## 2023-01-29 NOTE — Progress Notes (Signed)
HEMATOLOGY-ONCOLOGY TELEPHONE VISIT PROGRESS NOTE  I connected with our patient on 02/01/23 at  9:45 AM EDT by telephone and verified that I am speaking with the correct person using two identifiers.  I discussed the limitations, risks, security and privacy concerns of performing an evaluation and management service by telephone and the availability of in person appointments.  I also discussed with the patient that there may be a patient responsible charge related to this service. The patient expressed understanding and agreed to proceed.   History of Present Illness: Lisa Yoder is a 72 y.o. with above-mentioned history of right breast cancer. She presents to the clinic today for a telephone follow-up to see if she would benefit from extended endocrine therapy beyond 5 years.   Oncology History  Malignant neoplasm of upper-outer quadrant of right breast in female, estrogen receptor positive (HCC)  05/05/2018 Initial Diagnosis   Screening detected right breast mass, MRI revealed UIQ 1.5 x 0.8 x 0.8 cm mass.  Additional masses 5 mm size and 7 mm size and several smaller enhancing masses in the right breast, biopsy of the 1.5 cm mass invasive lobular cancer with LCIS ER 20%, PR 20%, Ki-67 less than 1%, HER-2 negative; biopsy of additional masses showed ALH and ILC with LCIS ER 100%, PR 70%, Ki-67 5%, HER-2 1+ negative   05/08/2018 -  Anti-estrogen oral therapy   Tamoxifen, plan 5-10 years   06/05/2018 Surgery   Right mastectomy: Invasive lobular carcinoma, multifocal, 1.2 cm is the largest tumor, grade 1, margins negative, 0/1 lymph node, ER 100%, PR 70%, HER-2 -1+, Ki-67 5% T1c N0 stage Ia Left mastectomy: Benign   06/18/2018 Cancer Staging   Staging form: Breast, AJCC 8th Edition - Pathologic: Stage IA (pT1c(m), pN0, cM0, G1, ER+, PR+, HER2-) - Signed by Loa Socks, NP on 06/18/2018   06/19/2018 Oncotype testing   Oncotype: score of 11 with 3% chance of distant recurrence in  9 years with tamoxifen alone    REVIEW OF SYSTEMS:   Constitutional: Denies fevers, chills or abnormal weight loss All other systems were reviewed with the patient and are negative. Observations/Objective:     Assessment Plan:  Malignant neoplasm of upper-outer quadrant of right breast in female, estrogen receptor positive (HCC) 06/05/2018: Bilateral mastectomies: Right mastectomy: Invasive lobular carcinoma, multifocal, 1.2 cm is the largest tumor, grade 1, margins negative, 0/1 lymph node, ER 100%, PR 70%, HER-2 -1+, Ki-67 5% T1c N0 stage Ia Left mastectomy: Benign Oncotype DX: Score 11, ROR 3%   Current treatment: Tamoxifen started January 2020 patient is able to tolerate 10 mg only (plan: 5 years) Tamoxifen toxicities:   1.  Hot flashes have become more manageable 2. Frequent UTIs: Topical Estrace cream twice a day is helping   Breast cancer surveillance: 1.  Chest wall examination 06/29/2020: No palpable lumps or nodules. patient had previous bilateral mastectomies. 2.  No role of imaging studies   Breast cancer index 01/15/23: No benefit for extended endocrine therapy. 1.7% ROR from year 5-10 So she can stop Tamoxifen by Jan 2025   Return to clinic in 1 year for follow-ups with Long term survivorship clinic.    I discussed the assessment and treatment plan with the patient. The patient was provided an opportunity to ask questions and all were answered. The patient agreed with the plan and demonstrated an understanding of the instructions. The patient was advised to call back or seek an in-person evaluation if the symptoms worsen or if the condition  fails to improve as anticipated.   I provided 12 minutes of non-face-to-face time during this encounter.  This includes time for charting and coordination of care   Tamsen Meek, MD  I Janan Ridge am acting as a scribe for Dr.Claramae Rigdon  I have reviewed the above documentation for accuracy and completeness, and I agree  with the above.

## 2023-02-01 ENCOUNTER — Inpatient Hospital Stay: Payer: Medicare PPO | Attending: Hematology and Oncology | Admitting: Hematology and Oncology

## 2023-02-01 DIAGNOSIS — C50411 Malignant neoplasm of upper-outer quadrant of right female breast: Secondary | ICD-10-CM

## 2023-02-01 DIAGNOSIS — Z170 Estrogen receptor positive status [ER+]: Secondary | ICD-10-CM

## 2023-02-01 NOTE — Assessment & Plan Note (Signed)
06/05/2018: Bilateral mastectomies: Right mastectomy: Invasive lobular carcinoma, multifocal, 1.2 cm is the largest tumor, grade 1, margins negative, 0/1 lymph node, ER 100%, PR 70%, HER-2 -1+, Ki-67 5% T1c N0 stage Ia Left mastectomy: Benign Oncotype DX: Score 11, ROR 3%   Current treatment: Tamoxifen started January 2020 patient is able to tolerate 10 mg only (plan: 10 years) Tamoxifen toxicities:   1.  Hot flashes have become more manageable 2. Frequent UTIs: Topical Estrace cream twice a day is helping   Breast cancer surveillance: 1.  Chest wall examination 06/29/2020: No palpable lumps or nodules. patient had previous bilateral mastectomies. 2.  No role of imaging studies   Breast cancer index 01/15/23: No benefit for extended endocrine therapy. 1.7% ROR from year 5-10 So she can stop Tamoxifen by Jan 2025   Return to clinic in 1 year for follow-ups with Long term survivorship clinic.

## 2023-02-10 ENCOUNTER — Emergency Department (HOSPITAL_COMMUNITY): Payer: Medicare PPO

## 2023-02-10 ENCOUNTER — Other Ambulatory Visit: Payer: Self-pay

## 2023-02-10 ENCOUNTER — Emergency Department (HOSPITAL_COMMUNITY)
Admission: EM | Admit: 2023-02-10 | Discharge: 2023-02-10 | Disposition: A | Discharge status: Home / Self Care | Payer: Medicare PPO | Attending: Emergency Medicine | Admitting: Emergency Medicine

## 2023-02-10 DIAGNOSIS — S61209A Unspecified open wound of unspecified finger without damage to nail, initial encounter: Secondary | ICD-10-CM

## 2023-02-10 DIAGNOSIS — S61203A Unspecified open wound of left middle finger without damage to nail, initial encounter: Secondary | ICD-10-CM | POA: Diagnosis not present

## 2023-02-10 DIAGNOSIS — Z79899 Other long term (current) drug therapy: Secondary | ICD-10-CM | POA: Insufficient documentation

## 2023-02-10 DIAGNOSIS — S56124A Laceration of flexor muscle, fascia and tendon of left middle finger at forearm level, initial encounter: Secondary | ICD-10-CM | POA: Diagnosis not present

## 2023-02-10 DIAGNOSIS — W25XXXA Contact with sharp glass, initial encounter: Secondary | ICD-10-CM | POA: Diagnosis not present

## 2023-02-10 DIAGNOSIS — S61213A Laceration without foreign body of left middle finger without damage to nail, initial encounter: Secondary | ICD-10-CM | POA: Diagnosis not present

## 2023-02-10 DIAGNOSIS — S6992XA Unspecified injury of left wrist, hand and finger(s), initial encounter: Secondary | ICD-10-CM | POA: Diagnosis present

## 2023-02-10 DIAGNOSIS — Z23 Encounter for immunization: Secondary | ICD-10-CM | POA: Diagnosis not present

## 2023-02-10 MED ORDER — TETANUS-DIPHTH-ACELL PERTUSSIS 5-2.5-18.5 LF-MCG/0.5 IM SUSY
0.5000 mL | PREFILLED_SYRINGE | Freq: Once | INTRAMUSCULAR | Status: AC
  Administered 2023-02-10: 0.5 mL via INTRAMUSCULAR
  Filled 2023-02-10: qty 0.5

## 2023-02-10 MED ORDER — CEPHALEXIN 500 MG PO CAPS: ORAL_CAPSULE | ORAL | 0 refills | Status: DC

## 2023-02-10 MED ORDER — LIDOCAINE-EPINEPHRINE (PF) 2 %-1:200000 IJ SOLN
10.0000 mL | Freq: Once | INTRAMUSCULAR | Status: AC
  Administered 2023-02-10: 10 mL via INTRADERMAL
  Filled 2023-02-10: qty 20

## 2023-02-10 MED ORDER — BACITRACIN ZINC 500 UNIT/GM EX OINT: TOPICAL_OINTMENT | Freq: Once | CUTANEOUS | Status: AC

## 2023-02-10 NOTE — Progress Notes (Signed)
Orthopedic Tech Progress Note Patient Details:  Lisa Yoder 20-Sep-1950 [id number]  Ortho Devices Type of Ortho Device: Finger splint Ortho Device/Splint Location: LUE, 3rd finger Ortho Device/Splint Interventions: Ordered, Application, Adjustment   Post Interventions Patient Tolerated: Well Instructions Provided: Care of device, Adjustment of device  Yvonna Brun [name] 02/10/2023, 3:29 PM

## 2023-02-10 NOTE — ED Triage Notes (Signed)
Pt arrived via POV. Pt referred from [facility]. Pt has laceration on L middle finger after cutting finger on broken glass. MD at [facility] concerned for a severed tendon.  AOx4

## 2023-02-10 NOTE — ED Provider Notes (Addendum)
[facility] Provider Note   CSN: [id number] Arrival date & time: 02/10/23  1158     History  Chief Complaint  Patient presents with   Laceration    Lisa Yoder is a 72 y.o. female.  72 yo F with a chief complaints of left middle finger laceration.  She had inadvertently cut it on a piece of broken glass today.  She went to urgent care and they were concerned about her lack of mobility.  Sent here to see orthopedics.   Laceration      Home Medications Prior to Admission medications   Medication Sig Start Date End Date Taking? Authorizing Provider  cephALEXin (KEFLEX) 500 MG capsule 2 caps po bid x 7 days 02/10/23  Yes Melene Plan, DO  cholecalciferol (VITAMIN D3) 25 MCG (1000 UNIT) tablet Take 1 tablet (1,000 Units total) by mouth daily. 01/09/22   Serena Croissant, MD  desvenlafaxine (PRISTIQ) 50 MG 24 hr tablet Take 1 tablet (50 mg total) by mouth daily. 02/18/19   Mahlon Gammon, MD  estradiol (ESTRACE) 0.1 MG/GM vaginal cream Place 1 Applicatorful vaginally 3 (three) times a week.    [provider]  Hypromellose (ARTIFICIAL TEARS OP) Place 1 drop into both eyes daily in the afternoon.    [provider]  LORazepam (ATIVAN) 0.5 MG tablet TAKE ONE TABLET BY MOUTH AT BEDTIME MAY TAKE EXTRA 1/2 TABLET AS NEEDED 10/02/22   Fargo, Amy E, NP  losartan (COZAAR) 25 MG tablet Take 1 tablet (25 mg total) by mouth every evening. 12/11/22   Mahlon Gammon, MD  losartan (COZAAR) 50 MG tablet Take 1 tablet (50 mg total) by mouth daily. 12/28/22   Mahlon Gammon, MD  Magnesium 200 MG TABS Take 1 tablet by mouth daily.    [provider]  meloxicam (MOBIC) 15 MG tablet TAKE ONE TABLET BY MOUTH ONCE DAILY FOR INFLAMMATION 07/24/22   Mahlon Gammon, MD  Multiple Vitamins-Minerals (PRESERVISION AREDS) CAPS Take 1 capsule by mouth 2 (two) times daily.     [provider]  nitrofurantoin (MACRODANTIN) 50 MG capsule  TAKE ONE CAPSULE BY MOUTH DAILY AS NEEDED 11/27/22   Fargo, Amy E, NP  NON FORMULARY Take 1 capsule by mouth 2 (two) times daily. Bio-Complete 3    [provider]  NON FORMULARY Symplex F 2 per day strengthen bone    [provider]  Omega-3 Fatty Acids (OMEGA 3 PO) Take 1 capsule by mouth daily.     [provider]  tamoxifen (NOLVADEX) 10 MG tablet TAKE ONE TABLET BY MOUTH TWICE DAILY 10/22/22   Serena Croissant, MD  Turmeric (QC TUMERIC COMPLEX PO) Take 5 mLs by mouth daily.    [provider]      Allergies    Bactrim [sulfamethoxazole-trimethoprim]    Review of Systems   Review of Systems  Physical Exam Updated Vital Signs BP (!) 155/92 (BP Location: Right Arm)   Pulse 84   Temp 98.1 F (36.7 C) (Oral)   Resp 18   Ht 5\' 4"  (1.626 m)   Wt 63.5 kg   SpO2 99%   BMI 24.03 kg/m  Physical Exam Vitals and nursing note reviewed.  Constitutional:      General: She is not in acute distress.    Appearance: She is well-developed. She is not diaphoretic.  HENT:     Head: Normocephalic and atraumatic.  Eyes:     Pupils: Pupils  are equal, round, and reactive to light.  Cardiovascular:     Rate and Rhythm: Normal rate and regular rhythm.     Heart sounds: No murmur heard.    No friction rub. No gallop.  Pulmonary:     Effort: Pulmonary effort is normal.     Breath sounds: No wheezing or rales.  Abdominal:     General: There is no distension.     Palpations: Abdomen is soft.     Tenderness: There is no abdominal tenderness.  Musculoskeletal:        General: No tenderness.     Cervical back: Normal range of motion and neck supple.     Comments: Transverse laceration about the intermediate phalanx of the left third digit.  She is unable to flex at either the MCP the PIP or the DIP.  Skin:    General: Skin is warm and dry.  Neurological:     Mental Status: She is alert and oriented to person, place, and time.  Psychiatric:        Behavior:  Behavior normal.     ED Results / Procedures / Treatments   Labs (all labs ordered are listed, but only abnormal results are displayed) Labs Reviewed - No data to display  EKG None  Radiology DG Finger Middle Left  Result Date: 02/10/2023 CLINICAL DATA:  Left middle finger laceration EXAM: LEFT MIDDLE FINGER 3V COMPARISON:  None Available. FINDINGS: No fracture or dislocation. Preserved joint spaces and bone mineralization. No radiopaque foreign body. There is a soft tissue defect adjacent to the middle phalanx of the finger anterior and medial. IMPRESSION: Soft tissue injury. No radiopaque foreign body. No acute osseous abnormality Electronically Signed   By: Karen Kays M.D.   On: 02/10/2023 13:30    Procedures .Marland KitchenLaceration Repair  Date/Time: 02/10/2023 2:22 PM  Performed by: Melene Plan, DO Authorized by: Melene Plan, DO   Consent:    Consent obtained:  Verbal   Consent given by:  Patient   Risks, benefits, and alternatives were discussed: yes     Risks discussed:  Infection, pain, poor cosmetic result and poor wound healing   Alternatives discussed:  No treatment Universal protocol:    Procedure explained and questions answered to patient or proxy's satisfaction: yes     Imaging studies available: yes     Immediately prior to procedure, a time out was called: yes     Patient identity confirmed:  Verbally with patient Anesthesia:    Anesthesia method:  Nerve block   Block location:  Digital   Block needle gauge:  27 G   Block anesthetic:  Lidocaine 2% WITH epi   Block technique:  Digital   Block injection procedure:  Anatomic landmarks identified, anatomic landmarks palpated and negative aspiration for blood   Block outcome:  Anesthesia achieved Laceration details:    Location:  Finger   Finger location:  L long finger   Length (cm):  2.8 Pre-procedure details:    Preparation:  Patient was prepped and draped in usual sterile fashion Exploration:    Hemostasis  achieved with:  Epinephrine and direct pressure   Imaging outcome: foreign body not noted     Wound exploration: wound explored through full range of motion and entire depth of wound visualized     Wound extent: tendon damage     Tendon damage location:  Upper extremity   Upper extremity tendon damage location:  Finger flexor   Tendon damage extent:  Complete transection  Tendon repair plan:  Refer for evaluation   Contaminated: no   Treatment:    Area cleansed with:  Chlorhexidine   Amount of cleaning:  Standard   Irrigation solution:  Sterile saline   Irrigation volume:  100   Irrigation method:  Pressure wash and syringe   Visualized foreign bodies/material removed: no     Debridement:  None   Undermining:  None   Scar revision: no   Skin repair:    Repair method:  Sutures   Suture size:  5-0   Suture material:  Nylon   Suture technique:  Simple interrupted   Number of sutures:  5 Approximation:    Approximation:  Close Repair type:    Repair type:  Simple Post-procedure details:    Dressing:  Antibiotic ointment and adhesive bandage   Procedure completion:  Tolerated well, no immediate complications     Medications Ordered in ED Medications  Tdap (BOOSTRIX) injection 0.5 mL (has no administration in time range)  lidocaine-EPINEPHrine (XYLOCAINE W/EPI) 2 %-1:200000 (PF) injection 10 mL (has no administration in time range)  bacitracin ointment (has no administration in time range)    ED Course/ Medical Decision Making/ A&P                             Medical Decision Making Amount and/or Complexity of Data Reviewed Radiology: ordered.  Risk OTC drugs. Prescription drug management.   72 yo F with a chief complaints of laceration to the left third digit.  She unfortunately had broken some glass and it cut through the finger.  She went to urgent care and there is some concern for ligamentous injury.  Sent here to be evaluated by hand.  It sounds like they did  not discuss it with a hand surgeon prior to transfer.  They did not obtain x-ray either.  Will obtain an x-ray here.  Digital block.  Likely repair at bedside and have follow-up closely with hand.  Plain film of the left third digit independently interpreted by me without fracture or foreign body.  I discussed the case with Dr. Yevette Kipp on-call for hand today.  He recommended closure here in the ER and follow-up in the office with Dr. Janee Morn.  I will start her on prophylactic antibiotics.  Patient without any obvious decree sensation to the finger.  2 point discrimination intact.  After digital block the patient was able to flex at the MCP and the PIP.  Unable to flex the DIP.  Splint in place.  2:24 PM:  I have discussed the diagnosis/risks/treatment options with the patient and friend .  Evaluation and diagnostic testing in the emergency department does not suggest an emergent condition requiring admission or immediate intervention beyond what has been performed at this time.  They will follow up with Hand, PCP. We also discussed returning to the ED immediately if new or worsening sx occur. We discussed the sx which are most concerning (e.g., sudden worsening pain, fever, inability to tolerate by mouth) that necessitate immediate return. Medications administered to the patient during their visit and any new prescriptions provided to the patient are listed below.  Medications given during this visit Medications  Tdap (BOOSTRIX) injection 0.5 mL (has no administration in time range)  lidocaine-EPINEPHrine (XYLOCAINE W/EPI) 2 %-1:200000 (PF) injection 10 mL (has no administration in time range)  bacitracin ointment (has no administration in time range)     The patient appears reasonably screen  and/or stabilized for discharge and I doubt any other medical condition or other [facility] requiring further screening, evaluation, or treatment in the ED at this time prior to discharge.          Final  Clinical Impression(s) / ED Diagnoses Final diagnoses:  Laceration of left middle finger without foreign body without damage to nail, initial encounter  Open wound of finger with tendon injury, initial encounter    Rx / DC Orders ED Discharge Orders          Ordered    cephALEXin (KEFLEX) 500 MG capsule        02/10/23 1420                [place], DO 02/10/23 1424

## 2023-02-10 NOTE — Discharge Instructions (Signed)
I am worried that you have a tendon injury to your finger.  Please call the hand surgeon tomorrow to set up an appointment to see them.

## 2023-02-12 DIAGNOSIS — S64493A Injury of digital nerve of left middle finger, initial encounter: Secondary | ICD-10-CM | POA: Diagnosis not present

## 2023-02-12 DIAGNOSIS — S66123A Laceration of flexor muscle, fascia and tendon of left middle finger at wrist and hand level, initial encounter: Secondary | ICD-10-CM | POA: Diagnosis not present

## 2023-02-13 DIAGNOSIS — Z4802 Encounter for removal of sutures: Secondary | ICD-10-CM | POA: Diagnosis not present

## 2023-02-13 DIAGNOSIS — S64493A Injury of digital nerve of left middle finger, initial encounter: Secondary | ICD-10-CM | POA: Diagnosis not present

## 2023-02-13 DIAGNOSIS — S64491A Injury of digital nerve of left index finger, initial encounter: Secondary | ICD-10-CM | POA: Diagnosis not present

## 2023-02-13 DIAGNOSIS — S66121A Laceration of flexor muscle, fascia and tendon of left index finger at wrist and hand level, initial encounter: Secondary | ICD-10-CM | POA: Diagnosis not present

## 2023-02-13 DIAGNOSIS — S66123A Laceration of flexor muscle, fascia and tendon of left middle finger at wrist and hand level, initial encounter: Secondary | ICD-10-CM | POA: Diagnosis not present

## 2023-02-14 HISTORY — PX: FINGER SURGERY: SHX640

## 2023-02-18 DIAGNOSIS — M9901 Segmental and somatic dysfunction of cervical region: Secondary | ICD-10-CM | POA: Diagnosis not present

## 2023-02-18 DIAGNOSIS — M9903 Segmental and somatic dysfunction of lumbar region: Secondary | ICD-10-CM | POA: Diagnosis not present

## 2023-02-18 DIAGNOSIS — M9904 Segmental and somatic dysfunction of sacral region: Secondary | ICD-10-CM | POA: Diagnosis not present

## 2023-02-18 DIAGNOSIS — M9902 Segmental and somatic dysfunction of thoracic region: Secondary | ICD-10-CM | POA: Diagnosis not present

## 2023-02-19 DIAGNOSIS — S66128D Laceration of flexor muscle, fascia and tendon of other finger at wrist and hand level, subsequent encounter: Secondary | ICD-10-CM | POA: Diagnosis not present

## 2023-02-19 DIAGNOSIS — S64493D Injury of digital nerve of left middle finger, subsequent encounter: Secondary | ICD-10-CM | POA: Diagnosis not present

## 2023-02-19 DIAGNOSIS — M25642 Stiffness of left hand, not elsewhere classified: Secondary | ICD-10-CM | POA: Diagnosis not present

## 2023-02-25 ENCOUNTER — Other Ambulatory Visit: Payer: Self-pay | Admitting: Internal Medicine

## 2023-02-25 DIAGNOSIS — I10 Essential (primary) hypertension: Secondary | ICD-10-CM

## 2023-02-25 NOTE — Telephone Encounter (Signed)
High Risk Warnings.

## 2023-02-26 DIAGNOSIS — S66128D Laceration of flexor muscle, fascia and tendon of other finger at wrist and hand level, subsequent encounter: Secondary | ICD-10-CM | POA: Diagnosis not present

## 2023-02-26 DIAGNOSIS — M25642 Stiffness of left hand, not elsewhere classified: Secondary | ICD-10-CM | POA: Diagnosis not present

## 2023-02-26 DIAGNOSIS — S64493D Injury of digital nerve of left middle finger, subsequent encounter: Secondary | ICD-10-CM | POA: Diagnosis not present

## 2023-02-28 ENCOUNTER — Other Ambulatory Visit: Payer: Medicare PPO

## 2023-02-28 DIAGNOSIS — I10 Essential (primary) hypertension: Secondary | ICD-10-CM | POA: Diagnosis not present

## 2023-03-01 LAB — COMPLETE METABOLIC PANEL WITH GFR: Potassium: 4.5 mmol/L (ref 3.5–5.3)

## 2023-03-04 DIAGNOSIS — S64493D Injury of digital nerve of left middle finger, subsequent encounter: Secondary | ICD-10-CM | POA: Diagnosis not present

## 2023-03-04 DIAGNOSIS — S66128D Laceration of flexor muscle, fascia and tendon of other finger at wrist and hand level, subsequent encounter: Secondary | ICD-10-CM | POA: Diagnosis not present

## 2023-03-04 DIAGNOSIS — M25642 Stiffness of left hand, not elsewhere classified: Secondary | ICD-10-CM | POA: Diagnosis not present

## 2023-03-06 ENCOUNTER — Encounter: Payer: Self-pay | Admitting: Internal Medicine

## 2023-03-06 ENCOUNTER — Non-Acute Institutional Stay: Payer: Medicare PPO | Admitting: Internal Medicine

## 2023-03-06 VITALS — BP 126/72 | HR 89 | Temp 97.7°F | Resp 17 | Ht 64.0 in | Wt 141.1 lb

## 2023-03-06 DIAGNOSIS — C50911 Malignant neoplasm of unspecified site of right female breast: Secondary | ICD-10-CM

## 2023-03-06 DIAGNOSIS — M533 Sacrococcygeal disorders, not elsewhere classified: Secondary | ICD-10-CM | POA: Diagnosis not present

## 2023-03-06 DIAGNOSIS — N390 Urinary tract infection, site not specified: Secondary | ICD-10-CM | POA: Diagnosis not present

## 2023-03-06 DIAGNOSIS — M545 Low back pain, unspecified: Secondary | ICD-10-CM | POA: Diagnosis not present

## 2023-03-06 DIAGNOSIS — G250 Essential tremor: Secondary | ICD-10-CM | POA: Diagnosis not present

## 2023-03-06 DIAGNOSIS — K219 Gastro-esophageal reflux disease without esophagitis: Secondary | ICD-10-CM

## 2023-03-06 DIAGNOSIS — F331 Major depressive disorder, recurrent, moderate: Secondary | ICD-10-CM

## 2023-03-06 DIAGNOSIS — I10 Essential (primary) hypertension: Secondary | ICD-10-CM | POA: Insufficient documentation

## 2023-03-06 DIAGNOSIS — G8929 Other chronic pain: Secondary | ICD-10-CM

## 2023-03-06 DIAGNOSIS — M810 Age-related osteoporosis without current pathological fracture: Secondary | ICD-10-CM

## 2023-03-06 NOTE — Progress Notes (Signed)
Location:  [facility] Service:  Clinic (12)  Provider:   Code Status:  Goals of Care:     02/10/2023   12:13 PM  Advanced Directives  Does Patient Have a Medical Advance Directive? No     Chief Complaint  Patient presents with   Medical Management of Chronic Issues    6 month follow up with labs . Patient states she would like to look at her hand and also she starting to get some shakes    Immunizations    Patient would be due for covid and flu vaccine     HPI: Patient is a 72 y.o. female seen today for medical management of chronic diseases.   Lives in [place] in [facility]   Her active issues include Left Finger laceration While she was getting a glass in her apartment Underwent Ligament repair by Dr Janee Morn Incision has healed Still has some stiches On therapy Not allowed to do extension Wearing brace Has some pain  Bilateral mastectomy for right breast invasive lobular cancer Follows with Dr. Georgiann Mohs and is on tamoxifen since 2019. Per his note she stops Tamoxifen in 07/2023 Recurent UTI Using Estrogen cream and uses Macrodantin Prn with Sexual activity Also on Cranberry Follows with Dr Vincente Poli PRN for treatment   Depression and Anxiety Takes Ativan and Pristiq Failed Ativan Taper   Low back pain  Takes Meloxiam Wears a brace and works with [employer]  Was seen by Lala Lund Diagnosed with Right Sacroiliac Joint dysfunction Underwent CT guided Injection which was successful and doing well Hypertension Tremor Both Hands and Head tremor   Past Medical History:  Diagnosis Date   Anxiety    Arthritis    "back" (06/05/2018)   Breast cancer, right breast (HCC) 03/2018   Chronic lower back pain    "if I don't do yoga" (06/05/2018)   Depression    Family history of breast cancer    GERD (gastroesophageal reflux disease)    Herpes simplex    on nose   History of hiatal hernia    Macular degeneration    Per [facility] New Patient Packet     Nephritis    "as a child" (06/05/2018)   Osteoporosis    SCC (squamous cell carcinoma)    "under my nose" (06/05/2018)   Strabismus    Per [facility] new patient packet    UTI (urinary tract infection) 8/15   Wears glasses     Past Surgical History:  Procedure Laterality Date   BREAST BIOPSY Right 04/2018   CATARACT EXTRACTION W/ INTRAOCULAR LENS  IMPLANT, BILATERAL Bilateral    CESAREAN SECTION  1977   COLONOSCOPY     CYST REMOVAL NECK Left 03/03/2014   Procedure: LEFT NECK CYST EXCISION;  Surgeon: Valarie Merino, MD;  Location: [facility];  Service: General;  Laterality: Left;   DILATION AND CURETTAGE OF UTERUS     EYE SURGERY     Strabismus   FINGER SURGERY Left 02/2023   MASTECTOMY Left 06/05/2018   MASTECTOMY COMPLETE / SIMPLE W/ SENTINEL NODE BIOPSY Right 06/05/2018   AND BLUE DYE INJECTION RIGHT BREAST   MASTECTOMY W/ SENTINEL NODE BIOPSY Bilateral 06/05/2018   Procedure: BILATERAL MASTECTOMIES WITH RIGHT SENTINEL LYMPH NODE BIOPSY AND BLUE DYE INJECTION RIGHT BREAST;  Surgeon: Manus Rudd, MD;  Location: [facility] OR;  Service: General;  Laterality: Bilateral;   SQUAMOUS CELL CARCINOMA EXCISION     "under my nose"   STRABISMUS  SURGERY Bilateral 05/02/2018   Procedure: BILATERAL REPAIR STRABISMUS;  Surgeon: French Ana, MD;  Location: [facility];  Service: Ophthalmology;  Laterality: Bilateral;   TONSILLECTOMY AND ADENOIDECTOMY     UPPER GI ENDOSCOPY     WISDOM TOOTH EXTRACTION      Allergies  Allergen Reactions   Bactrim [Sulfamethoxazole-Trimethoprim] Anaphylaxis, Hives and Rash    Outpatient Encounter Medications as of 03/06/2023  Medication Sig   cholecalciferol (VITAMIN D3) 25 MCG (1000 UNIT) tablet Take 1 tablet (1,000 Units total) by mouth daily.   desvenlafaxine (PRISTIQ) 50 MG 24 hr tablet Take 1 tablet (50 mg total) by mouth daily.   estradiol (ESTRACE) 0.1 MG/GM vaginal cream Place 1 Applicatorful vaginally 2 (two) times a week.    Hypromellose (ARTIFICIAL TEARS OP) Place 1 drop into both eyes daily in the afternoon.   LORazepam (ATIVAN) 0.5 MG tablet TAKE ONE TABLET BY MOUTH AT BEDTIME MAY TAKE EXTRA 1/2 TABLET AS NEEDED   losartan (COZAAR) 25 MG tablet Take 1 tablet (25 mg total) by mouth every evening.   losartan (COZAAR) 50 MG tablet TAKE ONE TABLET BY MOUTH DAILY   Magnesium 200 MG TABS Take 1 tablet by mouth daily.   meloxicam (MOBIC) 15 MG tablet TAKE ONE TABLET BY MOUTH ONCE DAILY FOR INFLAMMATION   Multiple Vitamins-Minerals (PRESERVISION AREDS) CAPS Take 1 capsule by mouth 2 (two) times daily.    nitrofurantoin (MACRODANTIN) 50 MG capsule TAKE ONE CAPSULE BY MOUTH DAILY AS NEEDED   NON FORMULARY Take 1 capsule by mouth 2 (two) times daily. Bio-Complete 3   NON FORMULARY Symplex F 2 per day strengthen bone   Omega-3 Fatty Acids (OMEGA 3 PO) Take 1 capsule by mouth daily.    tamoxifen (NOLVADEX) 10 MG tablet TAKE ONE TABLET BY MOUTH TWICE DAILY   Turmeric (QC TUMERIC COMPLEX PO) Take 5 mLs by mouth daily.   [DISCONTINUED] cephALEXin (KEFLEX) 500 MG capsule 2 caps po bid x 7 days (Patient not taking: Reported on 03/06/2023)   No facility-administered encounter medications on file as of 03/06/2023.    Review of Systems:  Review of Systems  Constitutional:  Negative for activity change and appetite change.  HENT: Negative.    Respiratory:  Negative for cough and shortness of breath.   Cardiovascular:  Negative for leg swelling.  Gastrointestinal:  Negative for constipation.  Genitourinary: Negative.   Musculoskeletal:  Negative for arthralgias, gait problem and myalgias.  Skin: Negative.   Neurological:  Negative for dizziness and weakness.  Psychiatric/Behavioral:  Negative for confusion, dysphoric mood and sleep disturbance.     Health Maintenance  Topic Date Due   Hepatitis C Screening  Never done   COVID-19 Vaccine (6 - 2023-24 season) 07/17/2022   INFLUENZA VACCINE  02/14/2023   Medicare Annual  Wellness (AWV)  04/06/2023 (Originally 06-Nov-1950)   Colonoscopy  12/22/2029   DTaP/Tdap/Td (4 - Td or Tdap) 02/09/2033   Pneumonia Vaccine 20+ Years old  Completed   DEXA SCAN  Completed   Zoster Vaccines- Shingrix  Completed   HPV VACCINES  Aged Out   MAMMOGRAM  Discontinued    Physical Exam: Vitals:   03/06/23 0842  BP: 126/72  Pulse: 89  Resp: 17  Temp: 97.7 F (36.5 C)  TempSrc: Temporal  SpO2: 96%  Weight: 141 lb 1.6 oz (64 kg)  Height: 5\' 4"  (1.626 m)   Body mass index is 24.22 kg/m. Physical Exam Vitals reviewed.  Constitutional:      Appearance: Normal  appearance.  HENT:     Head: Normocephalic.     Nose: Nose normal.     Mouth/Throat:     Mouth: Mucous membranes are moist.     Pharynx: Oropharynx is clear.  Eyes:     Pupils: Pupils are equal, round, and reactive to light.  Cardiovascular:     Rate and Rhythm: Normal rate and regular rhythm.     Pulses: Normal pulses.     Heart sounds: Normal heart sounds. No murmur heard. Pulmonary:     Effort: Pulmonary effort is normal.     Breath sounds: Normal breath sounds.  Abdominal:     General: Abdomen is flat. Bowel sounds are normal.     Palpations: Abdomen is soft.  Musculoskeletal:        General: No swelling.     Cervical back: Neck supple.     Comments: Surgical incision healing well Still has some stiches which are coming off  Skin:    General: Skin is warm.  Neurological:     General: No focal deficit present.     Mental Status: She is alert and oriented to person, place, and time.  Psychiatric:        Mood and Affect: Mood normal.        Thought Content: Thought content normal.     Labs reviewed: Basic Metabolic Panel: Recent Labs    05/22/22 1419 08/30/22 0811 02/28/23 0800  NA 137 141 141  K 4.1 4.5 4.5  CL 103 108 107  CO2 29 26 23   GLUCOSE 98 86 78  BUN 24* 19 26*  CREATININE 0.99 0.93 0.96  CALCIUM 9.5 9.2 9.4  TSH 3.25  --   --    Liver Function Tests: Recent Labs     08/30/22 0811 02/28/23 0800  AST 17 18  ALT 13 14  BILITOT 0.5 0.4  PROT 6.3 6.4   No results for input(s): "LIPASE", "AMYLASE" in the last 8760 hours. No results for input(s): "AMMONIA" in the last 8760 hours. CBC: Recent Labs    08/30/22 0811 02/28/23 0800  WBC 5.0 6.1  NEUTROABS 2,345 2,922  HGB 12.1 12.1  HCT 35.0 36.5  MCV 86.4 89.5  PLT 194 232   Lipid Panel: Recent Labs    08/30/22 0811 02/28/23 0800  CHOL 169 176  HDL 68 73  LDLCALC 80 80  TRIG 113 134  CHOLHDL 2.5 2.4   Lab Results  Component Value Date   HGBA1C 5.6 09/14/2021    Procedures since last visit: DG Finger Middle Left  Result Date: 02/10/2023 CLINICAL DATA:  Left middle finger laceration EXAM: LEFT MIDDLE FINGER 3V COMPARISON:  None Available. FINDINGS: No fracture or dislocation. Preserved joint spaces and bone mineralization. No radiopaque foreign body. There is a soft tissue defect adjacent to the middle phalanx of the finger anterior and medial. IMPRESSION: Soft tissue injury. No radiopaque foreign body. No acute osseous abnormality Electronically Signed   By: Karen Kays M.D.   On: 02/10/2023 13:30    Assessment/Plan 1. Essential hypertension Doing well on Cozaar  2. Moderate episode of recurrent major depressive disorder (HCC) On Pristiq and Ativan  Previous GDR failed  3. Recurrent UTI Does weill with Macrodantin PRN and Estrace cream  4. SI (sacroiliac) joint dysfunction Chiropracote Had it injected nbefore  5. Osteoporosis without current pathological fracture, unspecified osteoporosis type RFN and LEFn is -3.0 Have suggested Prolia per Endocrine She has refused so far 6. Essential tremor Info given  7. Anxiety Ativan PRn  8. Invasive lobular carcinoma of breast,  s/p Bilateral Masetectomy Tamoxifen till Jan 2025  9. Chronic bilateral low back pain without sciatica Meloxicam Previous GDR failed Try QOD  10. Gastroesophageal reflux disease without  esophagitis Restart Prilosec 20 mg QD 11 S/p Laceration of left middle Finger S/p Surgery for repair    Labs/tests ordered:  * No order type specified * Next appt:  Visit date not found

## 2023-03-06 NOTE — Patient Instructions (Signed)
Take Omeprazole/Prilosec 20 mg OTC every day for GI protection

## 2023-03-14 DIAGNOSIS — S66128D Laceration of flexor muscle, fascia and tendon of other finger at wrist and hand level, subsequent encounter: Secondary | ICD-10-CM | POA: Diagnosis not present

## 2023-03-14 DIAGNOSIS — M25642 Stiffness of left hand, not elsewhere classified: Secondary | ICD-10-CM | POA: Diagnosis not present

## 2023-03-14 DIAGNOSIS — S64493D Injury of digital nerve of left middle finger, subsequent encounter: Secondary | ICD-10-CM | POA: Diagnosis not present

## 2023-03-19 DIAGNOSIS — S64493D Injury of digital nerve of left middle finger, subsequent encounter: Secondary | ICD-10-CM | POA: Diagnosis not present

## 2023-03-19 DIAGNOSIS — M25642 Stiffness of left hand, not elsewhere classified: Secondary | ICD-10-CM | POA: Diagnosis not present

## 2023-03-19 DIAGNOSIS — S66128D Laceration of flexor muscle, fascia and tendon of other finger at wrist and hand level, subsequent encounter: Secondary | ICD-10-CM | POA: Diagnosis not present

## 2023-03-23 ENCOUNTER — Other Ambulatory Visit: Payer: Self-pay | Admitting: Internal Medicine

## 2023-03-25 DIAGNOSIS — M9904 Segmental and somatic dysfunction of sacral region: Secondary | ICD-10-CM | POA: Diagnosis not present

## 2023-03-25 DIAGNOSIS — M9902 Segmental and somatic dysfunction of thoracic region: Secondary | ICD-10-CM | POA: Diagnosis not present

## 2023-03-25 DIAGNOSIS — M9901 Segmental and somatic dysfunction of cervical region: Secondary | ICD-10-CM | POA: Diagnosis not present

## 2023-03-25 DIAGNOSIS — M9903 Segmental and somatic dysfunction of lumbar region: Secondary | ICD-10-CM | POA: Diagnosis not present

## 2023-03-26 DIAGNOSIS — S64493D Injury of digital nerve of left middle finger, subsequent encounter: Secondary | ICD-10-CM | POA: Diagnosis not present

## 2023-03-26 DIAGNOSIS — M25642 Stiffness of left hand, not elsewhere classified: Secondary | ICD-10-CM | POA: Diagnosis not present

## 2023-03-26 DIAGNOSIS — S66128D Laceration of flexor muscle, fascia and tendon of other finger at wrist and hand level, subsequent encounter: Secondary | ICD-10-CM | POA: Diagnosis not present

## 2023-03-26 DIAGNOSIS — M65342 Trigger finger, left ring finger: Secondary | ICD-10-CM | POA: Diagnosis not present

## 2023-04-02 DIAGNOSIS — M25642 Stiffness of left hand, not elsewhere classified: Secondary | ICD-10-CM | POA: Diagnosis not present

## 2023-04-02 DIAGNOSIS — S64493D Injury of digital nerve of left middle finger, subsequent encounter: Secondary | ICD-10-CM | POA: Diagnosis not present

## 2023-04-02 DIAGNOSIS — S66128D Laceration of flexor muscle, fascia and tendon of other finger at wrist and hand level, subsequent encounter: Secondary | ICD-10-CM | POA: Diagnosis not present

## 2023-04-09 DIAGNOSIS — M25642 Stiffness of left hand, not elsewhere classified: Secondary | ICD-10-CM | POA: Diagnosis not present

## 2023-04-09 DIAGNOSIS — S66128D Laceration of flexor muscle, fascia and tendon of other finger at wrist and hand level, subsequent encounter: Secondary | ICD-10-CM | POA: Diagnosis not present

## 2023-04-09 DIAGNOSIS — S64493D Injury of digital nerve of left middle finger, subsequent encounter: Secondary | ICD-10-CM | POA: Diagnosis not present

## 2023-04-15 ENCOUNTER — Encounter: Payer: Self-pay | Admitting: Orthopedic Surgery

## 2023-04-15 ENCOUNTER — Non-Acute Institutional Stay (INDEPENDENT_AMBULATORY_CARE_PROVIDER_SITE_OTHER): Payer: Medicare PPO | Admitting: Orthopedic Surgery

## 2023-04-15 VITALS — BP 124/80 | HR 107 | Temp 97.5°F | Resp 16 | Ht 64.0 in | Wt 140.3 lb

## 2023-04-15 DIAGNOSIS — Z0000 Encounter for general adult medical examination without abnormal findings: Secondary | ICD-10-CM | POA: Diagnosis not present

## 2023-04-15 NOTE — Progress Notes (Signed)
Subjective:   Lisa Yoder is a 72 y.o. female who presents for Medicare Annual (Subsequent) preventive examination.  Visit Complete: In person  Patient Medicare AWV questionnaire was completed by the patient on 04/15/2023; I have confirmed that all information answered by patient is correct and no changes since this date.  Cardiac Risk Factors include: advanced age (>73men, >44 women);hypertension     Objective:    Today's Vitals   04/15/23 1316 04/15/23 1333  BP: 124/80   Pulse: (!) 107   Resp: 16   Temp: (!) 97.5 F (36.4 C)   SpO2: 95%   Weight: 140 lb 4.8 oz (63.6 kg)   Height: 5\' 4"  (1.626 m)   PainSc:  4    Body mass index is 24.08 kg/m.     04/15/2023    1:25 PM 02/10/2023   12:13 PM 09/05/2022   10:17 AM 02/28/2022    1:27 PM 01/09/2022    1:39 PM 11/22/2021    3:02 PM 10/09/2021    8:33 AM  Advanced Directives  Does Patient Have a Medical Advance Directive? Yes No Yes Yes Yes Yes Yes  Type of Estate agent of [place];Living will;Out of facility DNR (pink MOST or yellow form)   Healthcare Power of [place];Living will;Out of facility DNR (pink MOST or yellow form) Healthcare Power of [place];Living will Out of facility DNR (pink MOST or yellow form);Healthcare Power of [employer] of facility DNR (pink MOST or yellow form);Healthcare Power of Attorney  Does patient want to make changes to medical advance directive? No - Patient declined  No - Patient declined No - Patient declined No - Patient declined No - Patient declined No - Patient declined  Copy of Healthcare Power of Attorney in Chart? Yes - validated most recent copy scanned in chart (See row information)   Yes - validated most recent copy scanned in chart (See row information) Yes - validated most recent copy scanned in chart (See row information) Yes - validated most recent copy scanned in chart (See row information) Yes - validated most recent copy scanned in chart (See row  information)  Pre-existing out of facility DNR order (yellow form or pink MOST form)      Yellow form placed in chart (order not valid for inpatient use) Yellow form placed in chart (order not valid for inpatient use)    Current Medications (verified) Outpatient Encounter Medications as of 04/15/2023  Medication Sig   cholecalciferol (VITAMIN D3) 25 MCG (1000 UNIT) tablet Take 1 tablet (1,000 Units total) by mouth daily.   desvenlafaxine (PRISTIQ) 50 MG 24 hr tablet Take 1 tablet (50 mg total) by mouth daily.   estradiol (ESTRACE) 0.1 MG/GM vaginal cream Place 1 Applicatorful vaginally 2 (two) times a week.   Hypromellose (ARTIFICIAL TEARS OP) Place 1 drop into both eyes daily in the afternoon.   LORazepam (ATIVAN) 0.5 MG tablet TAKE ONE TABLET BY MOUTH AT BEDTIME MAY TAKE EXTRA 1/2 TABLET AS NEEDED   losartan (COZAAR) 25 MG tablet Take 1 tablet (25 mg total) by mouth every evening.   losartan (COZAAR) 50 MG tablet TAKE ONE TABLET BY MOUTH DAILY   Magnesium 200 MG TABS Take 1 tablet by mouth daily.   meloxicam (MOBIC) 15 MG tablet TAKE ONE TABLET BY MOUTH ONCE DAILY FOR INFLAMMATION   Multiple Vitamins-Minerals (PRESERVISION AREDS) CAPS Take 1 capsule by mouth 2 (two) times daily.    nitrofurantoin (MACRODANTIN) 50 MG capsule TAKE ONE CAPSULE BY MOUTH DAILY  AS NEEDED   NON FORMULARY Take 1 capsule by mouth 2 (two) times daily. Bio-Complete 3   NON FORMULARY Symplex F 2 per day strengthen bone   Omega-3 Fatty Acids (OMEGA 3 PO) Take 1 capsule by mouth daily.    omeprazole (PRILOSEC OTC) 20 MG tablet Take 20 mg by mouth daily.   tamoxifen (NOLVADEX) 10 MG tablet TAKE ONE TABLET BY MOUTH TWICE DAILY   Turmeric (QC TUMERIC COMPLEX PO) Take 5 mLs by mouth daily.   No facility-administered encounter medications on file as of 04/15/2023.    Allergies (verified) Bactrim [sulfamethoxazole-trimethoprim]   History: Past Medical History:  Diagnosis Date   Anxiety    Arthritis    "back"  (06/05/2018)   Breast cancer, right breast (HCC) 03/2018   Chronic lower back pain    "if I don't do yoga" (06/05/2018)   Depression    Family history of breast cancer    GERD (gastroesophageal reflux disease)    Herpes simplex    on nose   History of hiatal hernia    Macular degeneration    Per [facility] New Patient Packet    Nephritis    "as a child" (06/05/2018)   Osteoporosis    SCC (squamous cell carcinoma)    "under my nose" (06/05/2018)   Strabismus    Per [facility] new patient packet    UTI (urinary tract infection) 8/15   Wears glasses    Past Surgical History:  Procedure Laterality Date   BREAST BIOPSY Right 04/2018   CATARACT EXTRACTION W/ INTRAOCULAR LENS  IMPLANT, BILATERAL Bilateral    CESAREAN SECTION  1977   COLONOSCOPY     CYST REMOVAL NECK Left 03/03/2014   Procedure: LEFT NECK CYST EXCISION;  Surgeon: Valarie Merino, MD;  Location: [facility];  Service: General;  Laterality: Left;   DILATION AND CURETTAGE OF UTERUS     EYE SURGERY     Strabismus   FINGER SURGERY Left 02/2023   MASTECTOMY Left 06/05/2018   MASTECTOMY COMPLETE / SIMPLE W/ SENTINEL NODE BIOPSY Right 06/05/2018   AND BLUE DYE INJECTION RIGHT BREAST   MASTECTOMY W/ SENTINEL NODE BIOPSY Bilateral 06/05/2018   Procedure: BILATERAL MASTECTOMIES WITH RIGHT SENTINEL LYMPH NODE BIOPSY AND BLUE DYE INJECTION RIGHT BREAST;  Surgeon: Manus Rudd, MD;  Location: [facility] OR;  Service: General;  Laterality: Bilateral;   SQUAMOUS CELL CARCINOMA EXCISION     "under my nose"   STRABISMUS SURGERY Bilateral 05/02/2018   Procedure: BILATERAL REPAIR STRABISMUS;  Surgeon: French Ana, MD;  Location: [facility];  Service: Ophthalmology;  Laterality: Bilateral;   TONSILLECTOMY AND ADENOIDECTOMY     UPPER GI ENDOSCOPY     WISDOM TOOTH EXTRACTION     Family History  Problem Relation Age of Onset   Breast cancer Mother 22   Hypertension Father    Mental illness Sister    Osteoporosis  Sister    Seizures Sister    Suicidality Sister    Cancer Maternal Grandfather        blood cancer?   Breast cancer Cousin        dx under 50   Cancer Paternal Aunt        type unk   Cancer Other        type unk   Cancer Son    Social History   Socioeconomic History   Marital status: Widowed    Spouse name: Not on file   Number of children: Not  on file   Years of education: Not on file   Highest education level: Not on file  Occupational History   Not on file  Tobacco Use   Smoking status: Former    Current packs/day: 0.00    Average packs/day: 0.1 packs/day for 5.0 years (0.5 ttl pk-yrs)    Types: Cigarettes    Start date: 12/23/1970    Quit date: 12/23/1975    Years since quitting: 47.3   Smokeless tobacco: Never  Vaping Use   Vaping status: Never Used  Substance and Sexual Activity   Alcohol use: Not Currently    Comment: 06/05/2018 "not drinking at all right now"   Drug use: Never   Sexual activity: Not Currently    Birth control/protection: Post-menopausal  Other Topics Concern   Not on file  Social History Narrative   Per [facility] New Patient Packet entered 03/19/2019      Diet: N/A      Caffeine: 1 cup per day       Married, if yes what year:yes (year left blank)      Do you live in a house, apartment, assisted living, condo, trailer, ect: Independent living- [facility] [address], 3 [address], 2 persons       Pets: No      Current/Past profession: Masters, Museum/gallery curator        Exercise: Yes, swim, walk, and yoga         Living Will:Yes   DNR:No   POA/HPOA: Yes      Functional Status:   Do you have difficulty bathing or dressing yourself? No   Do you have difficulty preparing food or eating? No, except dry food    Do you have difficulty managing your medications? No   Do you have difficulty managing your finances? No   Do you have difficulty affording your medications? No    Social Determinants of Health   Financial Resource Strain: Low Risk   (04/15/2023)   Overall Financial Resource Strain (CARDIA)    Difficulty of Paying Living Expenses: Not hard at all  Food Insecurity: No Food Insecurity (04/15/2023)   Hunger Vital Sign    Worried About Running Out of Food in the Last Year: Never true    Ran Out of Food in the Last Year: Never true  Transportation Needs: No Transportation Needs (04/15/2023)   PRAPARE - Administrator, Civil Service (Medical): No    Lack of Transportation (Non-Medical): No  Physical Activity: Sufficiently Active (04/15/2023)   Exercise Vital Sign    Days of Exercise per Week: 5 days    Minutes of Exercise per Session: 120 min  Stress: No Stress Concern Present (04/15/2023)   [employer] of Occupational Health - Occupational Stress Questionnaire    Feeling of Stress : Not at all  Social Connections: Moderately Isolated (04/15/2023)   Social Connection and Isolation Panel [NHANES]    Frequency of Communication with Friends and Family: More than three times a week    Frequency of Social Gatherings with Friends and Family: More than three times a week    Attends Religious Services: More than 4 times per year    Active Member of [employer] or Organizations: No    Attends Banker Meetings: Never    Marital Status: Widowed    Tobacco Counseling Counseling given: Not Answered   Clinical Intake:  Pre-visit preparation completed: No  Pain : 0-10 Pain Score: 4  Pain Type: Acute pain Pain Location: Finger (  Comment which one) Pain Orientation: Left Pain Radiating Towards: no radiation Pain Onset: Today Pain Frequency: Rarely     BMI - recorded: 24.08 Nutritional Status: BMI of 19-24  Normal Nutritional Risks: None Diabetes: No  How often do you need to have someone help you when you read instructions, pamphlets, or other written materials from your doctor or pharmacy?: 1 - Never What is the last grade level you completed in school?: Masters degree  Interpreter Needed?:  No      Activities of Daily Living    04/15/2023    1:36 PM  In your present state of health, do you have any difficulty performing the following activities:  Hearing? 0  Vision? 0  Difficulty concentrating or making decisions? 0  Walking or climbing stairs? 0  Dressing or bathing? 0  Doing errands, shopping? 0  Preparing Food and eating ? N  Using the Toilet? N  In the past six months, have you accidently leaked urine? N  Do you have problems with loss of bowel control? N  Managing your Medications? N  Managing your Finances? N  Housekeeping or managing your Housekeeping? N    Patient Care Team: Mahlon Gammon, MD as PCP - General (Internal Medicine) Kerin Salen, MD as Consulting Physician (Gastroenterology) Estill Bamberg, MD as Consulting Physician (Orthopedic Surgery) Serena Croissant, MD as Consulting Physician (Hematology and Oncology) Marcelle Overlie, MD as Consulting Physician (Obstetrics and Gynecology) Janet Berlin, MD as Consulting Physician (Ophthalmology) Newman Pies, MD as Consulting Physician (Otolaryngology)  Indicate any recent Medical Services you may have received from other than [facility] providers in the past year (date may be approximate).     Assessment:   This is a routine wellness examination for Lisa Yoder.  Hearing/Vision screen Hearing Screening - Comments:: No hearing concerns,  Vision Screening - Comments:: No vision concerns. Patient last eye exam Feb 2023. Patient wears prescription glasses.     Goals Addressed             This Visit's Progress    Maintain Mobility and Function   On track    Evidence-based guidance:  Acknowledge and validate impact of pain, loss of strength and potential disfigurement (hand osteoarthritis) on mental health and daily life, such as social isolation, anxiety, depression, impaired sexual relationship and   injury from falls.  Anticipate referral to physical or occupational therapy for assessment, therapeutic  exercise and recommendation for adaptive equipment or assistive devices; encourage participation.  Assess impact on ability to perform activities of daily living, as well as engage in sports and leisure events or requirements of work or school.  Provide anticipatory guidance and reassurance about the benefit of exercise to maintain function; acknowledge and normalize fear that exercise may worsen symptoms.  Encourage regular exercise, at least 10 minutes at a time for 45 minutes per week; consider yoga, water exercise and proprioceptive exercises; encourage use of wearable activity tracker to increase motivation and adherence.  Encourage maintenance or resumption of daily activities, including employment, as pain allows and with minimal exposure to trauma.  Assist patient to advocate for adaptations to the work environment.  Consider level of pain and function, gender, age, lifestyle, patient preference, quality of life, readiness and ?ocapacity to benefit? when recommending patients for orthopaedic surgery consultation.  Explore strategies, such as changes to medication regimen or activity that enables patient to anticipate and manage flare-ups that increase deconditioning and disability.  Explore patient preferences; encourage exposure to a broader range of activities that have  been avoided for fear of experiencing pain.  Identify barriers to participation in therapy or exercise, such as pain with activity, anticipated or imagined pain.  Monitor postoperative joint replacement or any preexisting joint replacement for ongoing pain and loss of function; provide social support and encouragement throughout recovery.   Notes:        Depression Screen    04/15/2023    1:23 PM 03/06/2023    8:44 AM 09/05/2022   10:17 AM 01/23/2015   12:33 PM 10/08/2014   10:15 AM  PHQ 2/9 Scores  PHQ - 2 Score 0 0 0 6 0  PHQ- 9 Score    27     Fall Risk    04/15/2023    1:23 PM 03/06/2023    8:44 AM 09/05/2022    10:17 AM 02/28/2022    1:26 PM 02/27/2022    4:34 PM  Fall Risk   Falls in the past year? 0 0 0 1 0  Number falls in past yr: 0 0 0 0 0  Injury with Fall? 0 0 0 0 0  Risk for fall due to : No Fall Risks No Fall Risks No Fall Risks Impaired balance/gait No Fall Risks  Follow up Falls evaluation completed;Education provided;Falls prevention discussed Falls evaluation completed Falls evaluation completed Falls evaluation completed Falls evaluation completed    MEDICARE RISK AT HOME: Medicare Risk at Home If so, are there any without handrails?: No Home free of loose throw rugs in walkways, pet beds, electrical cords, etc?: Yes Adequate lighting in your home to reduce risk of falls?: Yes Life alert?: No Use of a cane, walker or w/c?: No Grab bars in the bathroom?: Yes Shower chair or bench in shower?: Yes Elevated toilet seat or a handicapped toilet?: Yes  TIMED UP AND GO:  Was the test performed?  No    Cognitive Function:    04/15/2023    1:25 PM  MMSE - Mini Mental State Exam  Orientation to time 5  Orientation to Place 5  Registration 3  Attention/ Calculation 5  Recall 2  Language- name 2 objects 2  Language- repeat 1  Language- follow 3 step command 3  Language- read & follow direction 1  Write a sentence 1  Copy design 1  Total score 29        Immunizations Immunization History  Administered Date(s) Administered   DTaP 01/20/2007   Fluad Quad(high Dose 65+) 03/31/2019, 05/02/2022   Influenza Split 04/21/2015, 05/04/2017, 04/17/2018   Influenza, High Dose Seasonal PF 05/04/2017, 04/18/2018, 04/20/2021   Influenza-Unspecified 03/30/2011, 04/05/2020   Moderna Covid-19 Fall Seasonal Vaccine 40yrs & older 05/22/2022   Moderna SARS-COV2 Booster Vaccination 12/05/2020   Moderna Sars-Covid-2 Vaccination 07/20/2019, 08/17/2019, 05/30/2020   PFIZER(Purple Top)SARS-COV-2 Vaccination 03/16/2021   PPD Test 07/05/2016   Pfizer Covid-19 Vaccine Bivalent Booster 50yrs &  up 04/05/2021   Pneumococcal Conjugate-13 11/04/2015   Pneumococcal Polysaccharide-23 11/05/2016   Tdap 11/04/2015, 02/10/2023   Zoster Recombinant(Shingrix) 01/22/2018, 03/28/2018    TDAP status: Up to date  Flu Vaccine status: Due, Education has been provided regarding the importance of this vaccine. Advised may receive this vaccine at local pharmacy or Health Dept. Aware to provide a copy of the vaccination record if obtained from local pharmacy or Health Dept. Verbalized acceptance and understanding.  Pneumococcal vaccine status: Up to date  Covid-19 vaccine status: Completed vaccines  Qualifies for Shingles Vaccine? Yes   Zostavax completed No   Shingrix Completed?: Yes  Screening Tests  Health Maintenance  Topic Date Due   Hepatitis C Screening  Never done   INFLUENZA VACCINE  02/14/2023   COVID-19 Vaccine (6 - 2023-24 season) 03/17/2023   Medicare Annual Wellness (AWV)  04/14/2024   Colonoscopy  12/22/2029   DTaP/Tdap/Td (4 - Td or Tdap) 02/09/2033   Pneumonia Vaccine 78+ Years old  Completed   DEXA SCAN  Completed   Zoster Vaccines- Shingrix  Completed   HPV VACCINES  Aged Out   MAMMOGRAM  Discontinued    Health Maintenance  Health Maintenance Due  Topic Date Due   Hepatitis C Screening  Never done   INFLUENZA VACCINE  02/14/2023   COVID-19 Vaccine (6 - 2023-24 season) 03/17/2023    Colorectal cancer screening: No longer required.   Mammogram status: No longer required due to mastecomy> different screening.  Bone Density status: Completed 12/2021. Results reflect: Bone density results: OSTEOPOROSIS. Repeat every 2-4 years.  Lung Cancer Screening: (Low Dose CT Chest recommended if Age 25-80 years, 20 pack-year currently smoking OR have quit w/in 15years.) does not qualify.   Lung Cancer Screening Referral: No  Additional Screening:  Hepatitis C Screening: does not qualify; Completed   Vision Screening: Recommended annual ophthalmology exams for early  detection of glaucoma and other disorders of the eye. Is the patient up to date with their annual eye exam?  Yes  Who is the provider or what is the name of the office in which the patient attends annual eye exams? Dr.Tanner If pt is not established with a provider, would they like to be referred to a provider to establish care? No .   Dental Screening: Recommended annual dental exams for proper oral hygiene  Diabetic Foot Exam: Diabetic Foot Exam: Completed 04/15/2023  Community Resource Referral / Chronic Care Management: CRR required this visit?  No   CCM required this visit?  No     Plan:     I have personally reviewed and noted the following in the patient's chart:   Medical and social history Use of alcohol, tobacco or illicit drugs  Current medications and supplements including opioid prescriptions. Patient is not currently taking opioid prescriptions. Functional ability and status Nutritional status Physical activity Advanced directives List of other physicians Hospitalizations, surgeries, and ER visits in previous 12 months Vitals Screenings to include cognitive, depression, and falls Referrals and appointments  In addition, I have reviewed and discussed with patient certain preventive protocols, quality metrics, and best practice recommendations. A written personalized care plan for preventive services as well as general preventive health recommendations were provided to patient.     Octavia Heir, NP   04/15/2023   After Visit Summary: (MyChart) Due to this being a telephonic visit, the after visit summary with patients personalized plan was offered to patient via MyChart   Nurse Notes: MMSE 29/30, correct clock

## 2023-04-15 NOTE — Patient Instructions (Signed)
  Ms. Coury , Thank you for taking time to come for your Medicare Wellness Visit. I appreciate your ongoing commitment to your health goals. Please review the following plan we discussed and let me know if I can assist you in the future.   These are the goals we discussed:  Goals      Maintain Mobility and Function     Evidence-based guidance:  Acknowledge and validate impact of pain, loss of strength and potential disfigurement (hand osteoarthritis) on mental health and daily life, such as social isolation, anxiety, depression, impaired sexual relationship and   injury from falls.  Anticipate referral to physical or occupational therapy for assessment, therapeutic exercise and recommendation for adaptive equipment or assistive devices; encourage participation.  Assess impact on ability to perform activities of daily living, as well as engage in sports and leisure events or requirements of work or school.  Provide anticipatory guidance and reassurance about the benefit of exercise to maintain function; acknowledge and normalize fear that exercise may worsen symptoms.  Encourage regular exercise, at least 10 minutes at a time for 45 minutes per week; consider yoga, water exercise and proprioceptive exercises; encourage use of wearable activity tracker to increase motivation and adherence.  Encourage maintenance or resumption of daily activities, including employment, as pain allows and with minimal exposure to trauma.  Assist patient to advocate for adaptations to the work environment.  Consider level of pain and function, gender, age, lifestyle, patient preference, quality of life, readiness and ?ocapacity to benefit? when recommending patients for orthopaedic surgery consultation.  Explore strategies, such as changes to medication regimen or activity that enables patient to anticipate and manage flare-ups that increase deconditioning and disability.  Explore patient preferences; encourage  exposure to a broader range of activities that have been avoided for fear of experiencing pain.  Identify barriers to participation in therapy or exercise, such as pain with activity, anticipated or imagined pain.  Monitor postoperative joint replacement or any preexisting joint replacement for ongoing pain and loss of function; provide social support and encouragement throughout recovery.   Notes:         This is a list of the screening recommended for you and due dates:  Health Maintenance  Topic Date Due   Hepatitis C Screening  Never done   Flu Shot  02/14/2023   COVID-19 Vaccine (6 - 2023-24 season) 03/17/2023   Medicare Annual Wellness Visit  04/14/2024   Colon Cancer Screening  12/22/2029   DTaP/Tdap/Td vaccine (4 - Td or Tdap) 02/09/2033   Pneumonia Vaccine  Completed   DEXA scan (bone density measurement)  Completed   Zoster (Shingles) Vaccine  Completed   HPV Vaccine  Aged Out   Mammogram  Discontinued

## 2023-04-19 DIAGNOSIS — S66128D Laceration of flexor muscle, fascia and tendon of other finger at wrist and hand level, subsequent encounter: Secondary | ICD-10-CM | POA: Diagnosis not present

## 2023-04-19 DIAGNOSIS — S64493D Injury of digital nerve of left middle finger, subsequent encounter: Secondary | ICD-10-CM | POA: Diagnosis not present

## 2023-04-19 DIAGNOSIS — M25642 Stiffness of left hand, not elsewhere classified: Secondary | ICD-10-CM | POA: Diagnosis not present

## 2023-04-22 DIAGNOSIS — M9902 Segmental and somatic dysfunction of thoracic region: Secondary | ICD-10-CM | POA: Diagnosis not present

## 2023-04-22 DIAGNOSIS — M9903 Segmental and somatic dysfunction of lumbar region: Secondary | ICD-10-CM | POA: Diagnosis not present

## 2023-04-22 DIAGNOSIS — M9904 Segmental and somatic dysfunction of sacral region: Secondary | ICD-10-CM | POA: Diagnosis not present

## 2023-04-22 DIAGNOSIS — M9901 Segmental and somatic dysfunction of cervical region: Secondary | ICD-10-CM | POA: Diagnosis not present

## 2023-04-23 DIAGNOSIS — M65342 Trigger finger, left ring finger: Secondary | ICD-10-CM | POA: Diagnosis not present

## 2023-04-26 ENCOUNTER — Other Ambulatory Visit: Payer: Self-pay | Admitting: Hematology and Oncology

## 2023-04-30 DIAGNOSIS — S66128D Laceration of flexor muscle, fascia and tendon of other finger at wrist and hand level, subsequent encounter: Secondary | ICD-10-CM | POA: Diagnosis not present

## 2023-04-30 DIAGNOSIS — M25642 Stiffness of left hand, not elsewhere classified: Secondary | ICD-10-CM | POA: Diagnosis not present

## 2023-04-30 DIAGNOSIS — M65342 Trigger finger, left ring finger: Secondary | ICD-10-CM | POA: Diagnosis not present

## 2023-04-30 DIAGNOSIS — S64493D Injury of digital nerve of left middle finger, subsequent encounter: Secondary | ICD-10-CM | POA: Diagnosis not present

## 2023-05-07 DIAGNOSIS — S66128D Laceration of flexor muscle, fascia and tendon of other finger at wrist and hand level, subsequent encounter: Secondary | ICD-10-CM | POA: Diagnosis not present

## 2023-05-07 DIAGNOSIS — S64493D Injury of digital nerve of left middle finger, subsequent encounter: Secondary | ICD-10-CM | POA: Diagnosis not present

## 2023-05-07 DIAGNOSIS — M25642 Stiffness of left hand, not elsewhere classified: Secondary | ICD-10-CM | POA: Diagnosis not present

## 2023-05-07 DIAGNOSIS — M65342 Trigger finger, left ring finger: Secondary | ICD-10-CM | POA: Diagnosis not present

## 2023-05-13 ENCOUNTER — Other Ambulatory Visit: Payer: Self-pay | Admitting: Orthopedic Surgery

## 2023-05-14 DIAGNOSIS — S64493D Injury of digital nerve of left middle finger, subsequent encounter: Secondary | ICD-10-CM | POA: Diagnosis not present

## 2023-05-14 DIAGNOSIS — S66128D Laceration of flexor muscle, fascia and tendon of other finger at wrist and hand level, subsequent encounter: Secondary | ICD-10-CM | POA: Diagnosis not present

## 2023-05-14 DIAGNOSIS — M65342 Trigger finger, left ring finger: Secondary | ICD-10-CM | POA: Diagnosis not present

## 2023-05-14 DIAGNOSIS — M25642 Stiffness of left hand, not elsewhere classified: Secondary | ICD-10-CM | POA: Diagnosis not present

## 2023-05-20 DIAGNOSIS — M9903 Segmental and somatic dysfunction of lumbar region: Secondary | ICD-10-CM | POA: Diagnosis not present

## 2023-05-20 DIAGNOSIS — M9902 Segmental and somatic dysfunction of thoracic region: Secondary | ICD-10-CM | POA: Diagnosis not present

## 2023-05-20 DIAGNOSIS — M9904 Segmental and somatic dysfunction of sacral region: Secondary | ICD-10-CM | POA: Diagnosis not present

## 2023-05-20 DIAGNOSIS — M9901 Segmental and somatic dysfunction of cervical region: Secondary | ICD-10-CM | POA: Diagnosis not present

## 2023-05-21 ENCOUNTER — Ambulatory Visit: Payer: Medicare PPO | Admitting: Internal Medicine

## 2023-05-21 ENCOUNTER — Encounter: Payer: Self-pay | Admitting: Internal Medicine

## 2023-05-21 VITALS — BP 112/64 | HR 92 | Ht 64.0 in | Wt 140.6 lb

## 2023-05-21 DIAGNOSIS — E063 Autoimmune thyroiditis: Secondary | ICD-10-CM

## 2023-05-21 DIAGNOSIS — M810 Age-related osteoporosis without current pathological fracture: Secondary | ICD-10-CM | POA: Diagnosis not present

## 2023-05-21 DIAGNOSIS — E559 Vitamin D deficiency, unspecified: Secondary | ICD-10-CM | POA: Diagnosis not present

## 2023-05-21 NOTE — Progress Notes (Unsigned)
Patient ID: Lisa Yoder, female   DOB: Jul 02, 1951, 72 y.o.   MRN: [id number]  HPI  Lisa Yoder is a 72 y.o.-year-old female, returning for for new diagnosis of Hashimoto's hypothyroidism and osteoporosis.  Last visit 1 year ago. ObGyn: Dr. Marcelle Overlie.  Interim history: She has R SI pain - has a belt. Tried yoga, massage, PT, chiropractor.  She sees orthopedics - has steroid inj. No injections since last OV.  No falls or fractures.  She denies dizziness, vertigo, orthostasis. She cut her L finger >> had to have surgery 01/2023. Still hurting. No weight since last visit.  Reviewed and addended history: Osteoporosis: - dx'ed in 2006.  Reviewed patient's DXA scan reports:  DXA 03/26/2022 ([place])  Lumbar spine L1-L4(L2) Femoral neck (FN)  T-score   -2.5 RFN: -3.0 LFN: -3.0  Change in BMD from previous DXA test (%) Down 1.4% Down 2.1%  (*) statistically significant   DXA 03/16/2020 ([facility]) Lumbar spine L1-L4(L3) Femoral neck (FN)  T-score   -2.7 RFN: -2.9 LFN: -2.9  Change in BMD from previous DXA test (%) Up 9.3%* Up 1.3%  (*) statistically significant  Previously: Date L1-L4 T score FN T score  05/28/2017 ([place]) (L3): -3.3 (-0.9%) LFN: -2.8 (-2.2%*) RFN: -3.1  05/27/2015 ([facility]) -3.0 LFN: -2.7 RFN: -3.1  05/25/2013 ([facility]) -3.5 (-10.4%* c/w 03/14/2009) LFN: -3.4 RFN: -3.5  03/14/2009 -2.8   03/03/2007 -2.7   04/13/2005 -2.5    She was on the following osteoporosis treatments: - Fosamax in 2006 >> for 1-2 years. - Actonel >> for 2 more years.  She previously refused Prolia but then agreed to eat, however, in 05/2022, it was denied by her insurance.  I suggested Reclast, but she did not start this yet.  + history of vitamin D deficiency.  Reviewed recent vitamin D levels: Lab Results  Component Value Date   VD25OH 47.12 05/22/2022   VD25OH 40.61 11/16/2021   VD25OH 43 10/02/2019   VD25OH 31 05/20/2019   VD25OH 37.68 10/22/2017    VD25OH 31.59 08/28/2016   VD25OH 43.57 09/02/2015   VD25OH 42 10/08/2014  In the past, she was on 5000 units vitamin D daily, but as vitamin D level was close to the lower limit of normal, I advised her to increase to 7000 units daily >> then 2000 units daily and then we increased to 4000 units daily. This is also followed by Dr. Chales Abrahams >> dose decreased to 2000 units.  She does weightbearing exercises. Also yoga.  She is not on high vitamin A doses.  No history of sustained hypo or hypercalcemia.  No hyperparathyroidism.  No kidney stones. Lab Results  Component Value Date   CALCIUM 9.4 02/28/2023   CALCIUM 9.2 08/30/2022   CALCIUM 9.5 05/22/2022   CALCIUM 9.5 09/14/2021   CALCIUM 9.4 01/18/2021   CALCIUM 9.5 06/15/2020   CALCIUM 10.0 01/28/2020   CALCIUM 9.2 10/02/2019   CALCIUM 9.8 05/20/2019   CALCIUM 9.2 06/06/2018   No history of CKD.  Latest creatinine was higher: Lab Results  Component Value Date   BUN 26 (H) 02/28/2023   CREATININE 0.96 02/28/2023   Menopause was at 72 years old.  She does have a family history of osteoporosis in her sister, who is on Forteo.  Hashimoto's thyroiditis:  Lab Results  Component Value Date   TSH 3.25 05/22/2022   TSH 5.27 11/16/2021   TSH 6.06 (H) 10/02/2021   TSH 5.56 (H) 09/14/2021   TSH 5.32 (H) 01/18/2021  Lab Results  Component Value Date   FREET4 0.62 05/22/2022   FREET4 0.63 11/16/2021   T3FREE 2.4 11/16/2021   Total T4 level was normal on 10/02/2021.  Component     Latest Ref Rng 10/02/2021  Thyroglobulin Ab     < or = 1 IU/mL 1   Thyroperoxidase Ab SerPl-aCnc     <9 IU/mL 434 (H)     Pt denies: - feeling nodules in neck - hoarseness - choking She has dysphagia with dry food - chronic. She has a history of a small hiatal hernia.  No FH of thyroid disease or thyroid cancer. No h/o radiation tx to head or neck. No seaweed or kelp. No recent contrast studies. No herbal supplements. No Biotin use. No recent  steroids use.   She will come off Tamoxifen - 07/2023.  [facility] resides in the [facility].   ROS: + see HPI  I reviewed pt's medications, allergies, PMH, social hx, family hx, and changes were documented in the history of present illness. Otherwise, unchanged from my initial visit note.  Past Medical History:  Diagnosis Date   Anxiety    Arthritis    "back" (06/05/2018)   Breast cancer, right breast (HCC) 03/2018   Chronic lower back pain    "if I don't do yoga" (06/05/2018)   Depression    Family history of breast cancer    GERD (gastroesophageal reflux disease)    Herpes simplex    on nose   History of hiatal hernia    Macular degeneration    Per [facility] New Patient Packet    Nephritis    "as a child" (06/05/2018)   Osteoporosis    SCC (squamous cell carcinoma)    "under my nose" (06/05/2018)   Strabismus    Per [facility] new patient packet    UTI (urinary tract infection) 8/15   Wears glasses    Past Surgical History:  Procedure Laterality Date   BREAST BIOPSY Right 04/2018   CATARACT EXTRACTION W/ INTRAOCULAR LENS  IMPLANT, BILATERAL Bilateral    CESAREAN SECTION  1977   COLONOSCOPY     CYST REMOVAL NECK Left 03/03/2014   Procedure: LEFT NECK CYST EXCISION;  Surgeon: Valarie Merino, MD;  Location: [facility];  Service: General;  Laterality: Left;   DILATION AND CURETTAGE OF UTERUS     EYE SURGERY     Strabismus   FINGER SURGERY Left 02/2023   MASTECTOMY Left 06/05/2018   MASTECTOMY COMPLETE / SIMPLE W/ SENTINEL NODE BIOPSY Right 06/05/2018   AND BLUE DYE INJECTION RIGHT BREAST   MASTECTOMY W/ SENTINEL NODE BIOPSY Bilateral 06/05/2018   Procedure: BILATERAL MASTECTOMIES WITH RIGHT SENTINEL LYMPH NODE BIOPSY AND BLUE DYE INJECTION RIGHT BREAST;  Surgeon: Manus Rudd, MD;  Location: [facility] OR;  Service: General;  Laterality: Bilateral;   SQUAMOUS CELL CARCINOMA EXCISION     "under my nose"   STRABISMUS SURGERY Bilateral 05/02/2018    Procedure: BILATERAL REPAIR STRABISMUS;  Surgeon: French Ana, MD;  Location: [facility];  Service: Ophthalmology;  Laterality: Bilateral;   TONSILLECTOMY AND ADENOIDECTOMY     UPPER GI ENDOSCOPY     WISDOM TOOTH EXTRACTION     History   Social History   Marital Status: Married    Spouse Name: N/A   Number of Children: 2   Occupational History    Warehouse manager   Social History Main Topics   Smoking status: Former Smoker  Types: Cigarettes    Quit date:  1986    Smokeless tobacco: Never Used   Alcohol Use: Yes   Drug Use: No   Current Outpatient Medications on File Prior to Visit  Medication Sig Dispense Refill   cholecalciferol (VITAMIN D3) 25 MCG (1000 UNIT) tablet Take 1 tablet (1,000 Units total) by mouth daily.     desvenlafaxine (PRISTIQ) 50 MG 24 hr tablet Take 1 tablet (50 mg total) by mouth daily. 30 tablet 3   estradiol (ESTRACE) 0.1 MG/GM vaginal cream Place 1 Applicatorful vaginally 2 (two) times a week.     Hypromellose (ARTIFICIAL TEARS OP) Place 1 drop into both eyes daily in the afternoon.     LORazepam (ATIVAN) 0.5 MG tablet TAKE ONE TABLET BY MOUTH AT BEDTIME MAY TAKE EXTRA 1/2 TABLET AS NEEDED 45 tablet 5   losartan (COZAAR) 25 MG tablet Take 1 tablet (25 mg total) by mouth every evening. 90 tablet 1   losartan (COZAAR) 50 MG tablet TAKE ONE TABLET BY MOUTH DAILY 90 tablet 1   Magnesium 200 MG TABS Take 1 tablet by mouth daily.     meloxicam (MOBIC) 15 MG tablet TAKE ONE TABLET BY MOUTH ONCE DAILY FOR INFLAMMATION 90 tablet 1   Multiple Vitamins-Minerals (PRESERVISION AREDS) CAPS Take 1 capsule by mouth 2 (two) times daily.      nitrofurantoin (MACRODANTIN) 50 MG capsule TAKE ONE CAPSULE BY MOUTH DAILY AS NEEDED 30 capsule 5   NON FORMULARY Take 1 capsule by mouth 2 (two) times daily. Bio-Complete 3     NON FORMULARY Symplex F 2 per day strengthen bone     Omega-3 Fatty Acids (OMEGA 3 PO) Take 1 capsule by mouth daily.       omeprazole (PRILOSEC OTC) 20 MG tablet Take 20 mg by mouth daily.     tamoxifen (NOLVADEX) 10 MG tablet TAKE ONE TABLET BY MOUTH TWICE DAILY 90 tablet 3   Turmeric (QC TUMERIC COMPLEX PO) Take 5 mLs by mouth daily.     No current facility-administered medications on file prior to visit.   Allergies  Allergen Reactions   Bactrim [Sulfamethoxazole-Trimethoprim] Anaphylaxis, Hives and Rash   Family History  Problem Relation Age of Onset   Breast cancer Mother 10   Hypertension Father    Mental illness Sister    Osteoporosis Sister    Seizures Sister    Suicidality Sister    Cancer Maternal Grandfather        blood cancer?   Breast cancer Cousin        dx under 50   Cancer Paternal Aunt        type unk   Cancer Other        type unk   Cancer Son    PE: BP 112/64   Pulse 92   Ht 5\' 4"  (1.626 m)   Wt 140 lb 9.6 oz (63.8 kg)   SpO2 96%   BMI 24.13 kg/m   Wt Readings from Last 10 Encounters:  05/21/23 140 lb 9.6 oz (63.8 kg)  04/15/23 140 lb 4.8 oz (63.6 kg)  03/06/23 141 lb 1.6 oz (64 kg)  02/10/23 140 lb (63.5 kg)  01/10/23 140 lb 14.4 oz (63.9 kg)  09/05/22 138 lb 12.8 oz (63 kg)  06/27/22 138 lb (62.6 kg)  05/22/22 138 lb 9.6 oz (62.9 kg)  04/09/22 140 lb (63.5 kg)  02/28/22 140 lb 6.4 oz (63.7 kg)   Constitutional: normal weight, in NAD Eyes:  EOMI,  no exophthalmos ENT: no neck masses, no cervical lymphadenopathy Cardiovascular: RRR, No MRG Respiratory: CTA B Musculoskeletal: no deformities Skin:no rashes Neurological: + Head tremor and tremor with outstretched hands (essential tremor)  Assessment: Hashimoto's hypothyroidism  2. Osteoporosis  3.  History of vitamin D deficiency  Plan: 1.  Hashimoto's hypothyroidism -She had elevated TPO antibodies but TFTs were not necessarily high enough to mandate treatment. -We will recheck her TFTs now -Occasions for treatment are Desire for pregnancy TSH higher than 10 TSH lower than 10 with signs or symptoms  consistent with hypothyroidism -We did discuss about how to take levothyroxine in case we need to start: Every day, with water, at least 30 minutes before breakfast, separated by at least 4 hours from acid reflux medications, multivitamins, calcium, iron -I we will see her back in 1 year  2. Osteoporosis -Likely postmenopausal/age-related -Worsened after stopping bisphosphonate.  Of note, she was tolerating this well -No falls or fractures since last visit -Her bone density report from 2021 showed that the spine T score was much improved and the right femoral neck T score was also improved.  In 2021 she was diagnosed with breast cancer and started tamoxifen.  We discussed that this is actually beneficial for the bones as opposed to aromatase inhibitors, which are detrimental.  Since the T-scores were still quite low., I did suggest antiosteoporotic medication.  Since she had hiatal hernia and history of dysphagia, I did not suggest a bisphosphonate (she was on Actonel in the past).  She ended up not starting medication at that time. -At last visit she was doing a good job exercising consistently and she continues: yoga, swimming, bands, strength exercises. -Her most recent bone density scan from 03/2022 showed approximately stable T-scores.  They were still low so we discussed about starting osteoporotic medication.  We discussed about the mechanism of action, benefits, and side effects of the main medication classes. -Latest calcium and kidney functions were normal.  We were planning to start Prolia, but this was declined by her insurance so we decided to start Reclast.  She ended up not starting it yet.  She mentions that she would really wants to wait until next bone density to see if she absolutely needs to start medication.  She will come off tamoxifen in 07/2023 and wants to see the impact of this on the bones.  3. H/o vit D def -She continues on 1000 units vitamin D daily -Latest vitamin D  level was normal: Lab Results  Component Value Date   VD25OH 47.12 05/22/2022  -We will repeat the level now.  Carlus Pavlov, MD PhD [facility] Endocrinology

## 2023-05-21 NOTE — Patient Instructions (Signed)
Please stop at the lab.  Please come back for a follow-up appointment in 1 year.  

## 2023-05-22 ENCOUNTER — Encounter: Payer: Self-pay | Admitting: Internal Medicine

## 2023-05-22 LAB — TSH: TSH: 2.7 u[IU]/mL (ref 0.35–5.50)

## 2023-05-22 LAB — VITAMIN D 25 HYDROXY (VIT D DEFICIENCY, FRACTURES): VITD: 38.26 ng/mL (ref 30.00–100.00)

## 2023-05-23 ENCOUNTER — Ambulatory Visit: Payer: Medicare PPO | Admitting: Internal Medicine

## 2023-05-23 DIAGNOSIS — S66128D Laceration of flexor muscle, fascia and tendon of other finger at wrist and hand level, subsequent encounter: Secondary | ICD-10-CM | POA: Diagnosis not present

## 2023-05-23 DIAGNOSIS — M65342 Trigger finger, left ring finger: Secondary | ICD-10-CM | POA: Diagnosis not present

## 2023-05-23 DIAGNOSIS — S64493D Injury of digital nerve of left middle finger, subsequent encounter: Secondary | ICD-10-CM | POA: Diagnosis not present

## 2023-05-23 DIAGNOSIS — M25642 Stiffness of left hand, not elsewhere classified: Secondary | ICD-10-CM | POA: Diagnosis not present

## 2023-05-23 DIAGNOSIS — S66123A Laceration of flexor muscle, fascia and tendon of left middle finger at wrist and hand level, initial encounter: Secondary | ICD-10-CM | POA: Diagnosis not present

## 2023-05-28 DIAGNOSIS — M25642 Stiffness of left hand, not elsewhere classified: Secondary | ICD-10-CM | POA: Diagnosis not present

## 2023-05-28 DIAGNOSIS — M65342 Trigger finger, left ring finger: Secondary | ICD-10-CM | POA: Diagnosis not present

## 2023-05-28 DIAGNOSIS — S64493D Injury of digital nerve of left middle finger, subsequent encounter: Secondary | ICD-10-CM | POA: Diagnosis not present

## 2023-05-28 DIAGNOSIS — S66128D Laceration of flexor muscle, fascia and tendon of other finger at wrist and hand level, subsequent encounter: Secondary | ICD-10-CM | POA: Diagnosis not present

## 2023-06-04 DIAGNOSIS — S64493D Injury of digital nerve of left middle finger, subsequent encounter: Secondary | ICD-10-CM | POA: Diagnosis not present

## 2023-06-04 DIAGNOSIS — M25642 Stiffness of left hand, not elsewhere classified: Secondary | ICD-10-CM | POA: Diagnosis not present

## 2023-06-04 DIAGNOSIS — M65342 Trigger finger, left ring finger: Secondary | ICD-10-CM | POA: Diagnosis not present

## 2023-06-04 DIAGNOSIS — S66128D Laceration of flexor muscle, fascia and tendon of other finger at wrist and hand level, subsequent encounter: Secondary | ICD-10-CM | POA: Diagnosis not present

## 2023-06-07 ENCOUNTER — Other Ambulatory Visit: Payer: Self-pay | Admitting: Internal Medicine

## 2023-06-07 NOTE — Telephone Encounter (Signed)
Patient has request refill on medication that was just refill. Medication pend and sent to PCP Mahlon Gammon, MD

## 2023-06-11 DIAGNOSIS — S64493D Injury of digital nerve of left middle finger, subsequent encounter: Secondary | ICD-10-CM | POA: Diagnosis not present

## 2023-06-11 DIAGNOSIS — M25642 Stiffness of left hand, not elsewhere classified: Secondary | ICD-10-CM | POA: Diagnosis not present

## 2023-06-11 DIAGNOSIS — S66128D Laceration of flexor muscle, fascia and tendon of other finger at wrist and hand level, subsequent encounter: Secondary | ICD-10-CM | POA: Diagnosis not present

## 2023-06-11 DIAGNOSIS — M65342 Trigger finger, left ring finger: Secondary | ICD-10-CM | POA: Diagnosis not present

## 2023-06-26 DIAGNOSIS — M9902 Segmental and somatic dysfunction of thoracic region: Secondary | ICD-10-CM | POA: Diagnosis not present

## 2023-06-26 DIAGNOSIS — M9901 Segmental and somatic dysfunction of cervical region: Secondary | ICD-10-CM | POA: Diagnosis not present

## 2023-06-26 DIAGNOSIS — M9903 Segmental and somatic dysfunction of lumbar region: Secondary | ICD-10-CM | POA: Diagnosis not present

## 2023-06-26 DIAGNOSIS — M9904 Segmental and somatic dysfunction of sacral region: Secondary | ICD-10-CM | POA: Diagnosis not present

## 2023-06-29 DIAGNOSIS — H5000 Unspecified esotropia: Secondary | ICD-10-CM | POA: Diagnosis not present

## 2023-06-29 DIAGNOSIS — H524 Presbyopia: Secondary | ICD-10-CM | POA: Diagnosis not present

## 2023-06-29 DIAGNOSIS — H04123 Dry eye syndrome of bilateral lacrimal glands: Secondary | ICD-10-CM | POA: Diagnosis not present

## 2023-06-29 DIAGNOSIS — Z961 Presence of intraocular lens: Secondary | ICD-10-CM | POA: Diagnosis not present

## 2023-06-29 DIAGNOSIS — H353132 Nonexudative age-related macular degeneration, bilateral, intermediate dry stage: Secondary | ICD-10-CM | POA: Diagnosis not present

## 2023-07-02 DIAGNOSIS — M25642 Stiffness of left hand, not elsewhere classified: Secondary | ICD-10-CM | POA: Diagnosis not present

## 2023-08-02 DIAGNOSIS — M9902 Segmental and somatic dysfunction of thoracic region: Secondary | ICD-10-CM | POA: Diagnosis not present

## 2023-08-02 DIAGNOSIS — M9904 Segmental and somatic dysfunction of sacral region: Secondary | ICD-10-CM | POA: Diagnosis not present

## 2023-08-02 DIAGNOSIS — M9901 Segmental and somatic dysfunction of cervical region: Secondary | ICD-10-CM | POA: Diagnosis not present

## 2023-08-02 DIAGNOSIS — M9903 Segmental and somatic dysfunction of lumbar region: Secondary | ICD-10-CM | POA: Diagnosis not present

## 2023-08-26 ENCOUNTER — Other Ambulatory Visit: Payer: Self-pay | Admitting: Internal Medicine

## 2023-08-26 DIAGNOSIS — B070 Plantar wart: Secondary | ICD-10-CM | POA: Diagnosis not present

## 2023-08-26 DIAGNOSIS — I10 Essential (primary) hypertension: Secondary | ICD-10-CM

## 2023-08-26 NOTE — Telephone Encounter (Signed)
 High Risk Warning Populated when attempting to refill, I will send to Provider for further review

## 2023-09-02 DIAGNOSIS — Z779 Other contact with and (suspected) exposures hazardous to health: Secondary | ICD-10-CM | POA: Diagnosis not present

## 2023-09-02 DIAGNOSIS — Z113 Encounter for screening for infections with a predominantly sexual mode of transmission: Secondary | ICD-10-CM | POA: Diagnosis not present

## 2023-09-02 DIAGNOSIS — Z6825 Body mass index (BMI) 25.0-25.9, adult: Secondary | ICD-10-CM | POA: Diagnosis not present

## 2023-09-02 DIAGNOSIS — Z853 Personal history of malignant neoplasm of breast: Secondary | ICD-10-CM | POA: Diagnosis not present

## 2023-09-02 DIAGNOSIS — Z205 Contact with and (suspected) exposure to viral hepatitis: Secondary | ICD-10-CM | POA: Diagnosis not present

## 2023-09-02 DIAGNOSIS — Z118 Encounter for screening for other infectious and parasitic diseases: Secondary | ICD-10-CM | POA: Diagnosis not present

## 2023-09-03 ENCOUNTER — Other Ambulatory Visit: Payer: Self-pay | Admitting: Internal Medicine

## 2023-09-05 ENCOUNTER — Other Ambulatory Visit: Payer: Medicare PPO

## 2023-09-11 ENCOUNTER — Non-Acute Institutional Stay: Payer: Medicare PPO | Admitting: Internal Medicine

## 2023-09-11 VITALS — BP 120/80 | HR 94 | Temp 98.2°F | Resp 16 | Ht 64.0 in | Wt 139.1 lb

## 2023-09-11 DIAGNOSIS — G250 Essential tremor: Secondary | ICD-10-CM

## 2023-09-11 DIAGNOSIS — N390 Urinary tract infection, site not specified: Secondary | ICD-10-CM | POA: Diagnosis not present

## 2023-09-11 DIAGNOSIS — F331 Major depressive disorder, recurrent, moderate: Secondary | ICD-10-CM | POA: Diagnosis not present

## 2023-09-11 DIAGNOSIS — M810 Age-related osteoporosis without current pathological fracture: Secondary | ICD-10-CM

## 2023-09-11 DIAGNOSIS — I10 Essential (primary) hypertension: Secondary | ICD-10-CM

## 2023-09-11 DIAGNOSIS — M533 Sacrococcygeal disorders, not elsewhere classified: Secondary | ICD-10-CM

## 2023-09-11 NOTE — Progress Notes (Unsigned)
 Location:   [facility] Service:  Clinic (12)  Provider:   Code Status:  Goals of Care:     09/11/2023    9:24 AM  Advanced Directives  Does Patient Have a Medical Advance Directive? Yes  Type of Estate agent of [place];Living will;Out of facility DNR (pink MOST or yellow form)  Does patient want to make changes to medical advance directive? No - Patient declined     Chief Complaint  Patient presents with   Medical Management of Chronic Issues    Patient is being seen for a 6 month follow up    HPI: Patient is a 73 y.o. female seen today for medical management of chronic diseases.   Lives in [place] in [facility]   Her active issues include Left Finger laceration While she was getting a glass in her apartment Underwent Ligament repair by Dr Janee Morn She is currently working with a ball and a grip master to improve function, but progress has been slow.  Bilateral mastectomy for right breast invasive lobular cancer Stopped tamoxifen in 07/2023 Discharged from Dr Pamelia Hoit Now on Surveliance  Recurent UTI Using Estrogen cream and uses Macrodantin Prn with Sexual activity Also on [place] Follows with Dr Vincente Poli PRN for treatment   Depression and Anxiety Takes Ativan and Pristiq Wants to try taper again   Low back pain  Takes Meloxiam Wears a brace and works with [employer]  Was seen by Lala Lund Diagnosed with Right Sacroiliac Joint dysfunction Underwent CT guided Injection which was successful and doing well  Wants to taper Meloxicam  Hypertension Tremor Both Hands and Head tremor    Past Medical History:  Diagnosis Date   Anxiety    Arthritis    "back" (06/05/2018)   Breast cancer, right breast (HCC) 03/2018   Chronic lower back pain    "if I don't do yoga" (06/05/2018)   Depression    Family history of breast cancer    GERD (gastroesophageal reflux disease)    Herpes simplex    on nose   History of hiatal  hernia    Macular degeneration    Per [facility] New Patient Packet    Nephritis    "as a child" (06/05/2018)   Osteoporosis    SCC (squamous cell carcinoma)    "under my nose" (06/05/2018)   Strabismus    Per [facility] new patient packet    UTI (urinary tract infection) 8/15   Wears glasses     Past Surgical History:  Procedure Laterality Date   BREAST BIOPSY Right 04/2018   CATARACT EXTRACTION W/ INTRAOCULAR LENS  IMPLANT, BILATERAL Bilateral    CESAREAN SECTION  1977   COLONOSCOPY     CYST REMOVAL NECK Left 03/03/2014   Procedure: LEFT NECK CYST EXCISION;  Surgeon: Valarie Merino, MD;  Location: [facility];  Service: General;  Laterality: Left;   DILATION AND CURETTAGE OF UTERUS     EYE SURGERY     Strabismus   FINGER SURGERY Left 02/2023   MASTECTOMY Left 06/05/2018   MASTECTOMY COMPLETE / SIMPLE W/ SENTINEL NODE BIOPSY Right 06/05/2018   AND BLUE DYE INJECTION RIGHT BREAST   MASTECTOMY W/ SENTINEL NODE BIOPSY Bilateral 06/05/2018   Procedure: BILATERAL MASTECTOMIES WITH RIGHT SENTINEL LYMPH NODE BIOPSY AND BLUE DYE INJECTION RIGHT BREAST;  Surgeon: Manus Rudd, MD;  Location: [facility];  Service: General;  Laterality: Bilateral;   SQUAMOUS CELL CARCINOMA EXCISION     "  under my nose"   STRABISMUS SURGERY Bilateral 05/02/2018   Procedure: BILATERAL REPAIR STRABISMUS;  Surgeon: French Ana, MD;  Location: [facility];  Service: Ophthalmology;  Laterality: Bilateral;   TONSILLECTOMY AND ADENOIDECTOMY     UPPER GI ENDOSCOPY     WISDOM TOOTH EXTRACTION      Allergies  Allergen Reactions   Bactrim [Sulfamethoxazole-Trimethoprim] Anaphylaxis, Hives and Rash    Outpatient Encounter Medications as of 09/11/2023  Medication Sig   cholecalciferol (VITAMIN D3) 25 MCG (1000 UNIT) tablet Take 1 tablet (1,000 Units total) by mouth daily.   desvenlafaxine (PRISTIQ) 50 MG 24 hr tablet Take 1 tablet (50 mg total) by mouth daily.   estradiol (ESTRACE) 0.1 MG/GM  vaginal cream Place 1 Applicatorful vaginally 2 (two) times a week.   Hypromellose (ARTIFICIAL TEARS OP) Place 1 drop into both eyes daily in the afternoon.   LORazepam (ATIVAN) 0.5 MG tablet TAKE ONE TABLET BY MOUTH AT BEDTIME MAY TAKE EXTRA 1/2 TABLET AS NEEDED   losartan (COZAAR) 25 MG tablet Take 1 tablet (25 mg total) by mouth every evening.   losartan (COZAAR) 50 MG tablet TAKE ONE TABLET BY MOUTH DAILY   Magnesium 200 MG TABS Take 1 tablet by mouth daily.   meloxicam (MOBIC) 15 MG tablet TAKE ONE TABLET BY MOUTH ONCE DAILY FOR INFLAMMATION   Multiple Vitamins-Minerals (PRESERVISION AREDS) CAPS Take 1 capsule by mouth 2 (two) times daily.    nitrofurantoin (MACRODANTIN) 50 MG capsule TAKE ONE CAPSULE BY MOUTH DAILY AS NEEDED   NON FORMULARY Take 1 capsule by mouth 2 (two) times daily. Bio-Complete 3   NON FORMULARY Symplex F 2 per day strengthen bone   Omega-3 Fatty Acids (OMEGA 3 PO) Take 1 capsule by mouth daily.    Turmeric (QC TUMERIC COMPLEX PO) Take 5 mLs by mouth daily.   [DISCONTINUED] omeprazole (PRILOSEC OTC) 20 MG tablet Take 20 mg by mouth daily.   [DISCONTINUED] tamoxifen (NOLVADEX) 10 MG tablet TAKE ONE TABLET BY MOUTH TWICE DAILY   No facility-administered encounter medications on file as of 09/11/2023.    Review of Systems:  Review of Systems  Constitutional:  Negative for activity change and appetite change.  HENT: Negative.    Respiratory:  Negative for cough and shortness of breath.   Cardiovascular:  Negative for leg swelling.  Gastrointestinal:  Negative for constipation.  Genitourinary: Negative.   Musculoskeletal:  Positive for arthralgias and back pain. Negative for gait problem and myalgias.  Skin: Negative.   Neurological:  Negative for dizziness and weakness.  Psychiatric/Behavioral:  Negative for confusion, dysphoric mood and sleep disturbance. The patient is nervous/anxious.     Health Maintenance  Topic Date Due   Hepatitis C Screening  Never done    INFLUENZA VACCINE  02/14/2023   COVID-19 Vaccine (6 - 2024-25 season) 03/17/2023   Medicare Annual Wellness (AWV)  04/14/2024   Colonoscopy  12/22/2029   DTaP/Tdap/Td (4 - Td or Tdap) 02/09/2033   Pneumonia Vaccine 38+ Years old  Completed   DEXA SCAN  Completed   Zoster Vaccines- Shingrix  Completed   HPV VACCINES  Aged Out   MAMMOGRAM  Discontinued    Physical Exam: Vitals:   09/11/23 0922  BP: 120/80  Pulse: 94  Resp: 16  Temp: 98.2 F (36.8 C)  TempSrc: Temporal  SpO2: 97%  Weight: 139 lb 1.6 oz (63.1 kg)  Height: 5\' 4"  (1.626 m)   Body mass index is 23.88 kg/m. Physical Exam Vitals reviewed.  Constitutional:      Appearance: Normal appearance.  HENT:     Head: Normocephalic.     Right Ear: Tympanic membrane normal.     Left Ear: Tympanic membrane normal.     Nose: Nose normal.     Mouth/Throat:     Mouth: Mucous membranes are moist.     Pharynx: Oropharynx is clear.  Eyes:     Pupils: Pupils are equal, round, and reactive to light.  Cardiovascular:     Rate and Rhythm: Normal rate and regular rhythm.     Pulses: Normal pulses.     Heart sounds: Normal heart sounds. No murmur heard. Pulmonary:     Effort: Pulmonary effort is normal.     Breath sounds: Normal breath sounds.  Abdominal:     General: Abdomen is flat. Bowel sounds are normal.     Palpations: Abdomen is soft.  Musculoskeletal:        General: No swelling.     Cervical back: Neck supple.  Skin:    General: Skin is warm.  Neurological:     General: No focal deficit present.     Mental Status: She is alert and oriented to person, place, and time.  Psychiatric:        Mood and Affect: Mood normal.        Thought Content: Thought content normal.     Labs reviewed: Basic Metabolic Panel: Recent Labs    02/28/23 0800 05/21/23 1520  NA 141  --   K 4.5  --   CL 107  --   CO2 23  --   GLUCOSE 78  --   BUN 26*  --   CREATININE 0.96  --   CALCIUM 9.4  --   TSH  --  2.70   Liver  Function Tests: Recent Labs    02/28/23 0800  AST 18  ALT 14  BILITOT 0.4  PROT 6.4   No results for input(s): "LIPASE", "AMYLASE" in the last 8760 hours. No results for input(s): "AMMONIA" in the last 8760 hours. CBC: Recent Labs    02/28/23 0800  WBC 6.1  NEUTROABS 2,922  HGB 12.1  HCT 36.5  MCV 89.5  PLT 232   Lipid Panel: Recent Labs    02/28/23 0800  CHOL 176  HDL 73  LDLCALC 80  TRIG 134  CHOLHDL 2.4   Lab Results  Component Value Date   HGBA1C 5.6 09/14/2021    Procedures since last visit: No results found.  Assessment/Plan Assessment and Plan   1. Essential hypertension (Primary) Stable on Cozaar  2. Moderate episode of recurrent major depressive disorder (HCC) Stable on Pritiq  3. Recurrent UTI On estrogen and Macrodantin PRN  4. SI (sacroiliac) joint dysfunction Doing well since Injected  5. Osteoporosis without current pathological fracture, unspecified osteoporosis type T Score stable as she was on Tamoxifen Also very active and does exercise Follows with Dr Lafe Garin Repeat DEXA pending now that she is off tamoxifen 6. Essential tremor No Issues  7 Right Hand Ligament Repair   Persistent issues with two fingers following tendon surgery. Engaging in physical therapy exercises at home. -Continue with physical therapy exercises and grip master.  8 Anxiety: On Lorazepam 0.5mg , considering tapering off. -Consider reducing Lorazepam to half a tablet at night and monitor for withdrawal symptoms.  9 Pain Management: On Meloxicam, considering tapering off. -Consider reducing Meloxicam dose and supplement with Tylenol. Doesn't want to take Prilosec for GI  General Health Maintenance: -  Continue regular exercise regimen. -Consider discontinuing Prilosec if reflux symptoms improve.         Labs/tests ordered:  * No order type specified * Next appt:  Visit date not found

## 2023-09-13 NOTE — Addendum Note (Signed)
 Addended by: Cephus Richer on: 09/13/2023 03:46 PM   Modules accepted: Orders

## 2023-09-16 DIAGNOSIS — M9902 Segmental and somatic dysfunction of thoracic region: Secondary | ICD-10-CM | POA: Diagnosis not present

## 2023-09-16 DIAGNOSIS — M9901 Segmental and somatic dysfunction of cervical region: Secondary | ICD-10-CM | POA: Diagnosis not present

## 2023-09-16 DIAGNOSIS — M9904 Segmental and somatic dysfunction of sacral region: Secondary | ICD-10-CM | POA: Diagnosis not present

## 2023-09-16 DIAGNOSIS — M9903 Segmental and somatic dysfunction of lumbar region: Secondary | ICD-10-CM | POA: Diagnosis not present

## 2023-10-03 ENCOUNTER — Telehealth: Payer: Self-pay

## 2023-10-03 NOTE — Telephone Encounter (Signed)
 Copied from CRM [id number]. Topic: Clinical - Home Health Verbal Orders >> Oct 03, 2023  2:46 PM [name] wrote: patient, currently residing at a friend's home in [place], called to relay information after speaking with her physical therapist there. The  physical therapist recommends an order for therapy sessions focusing on two fingers that have not progressed post-surgery. Patient requests an assessment and a month of therapy, with a referral to a hand specialist if no improvement is seen.  Patient callback [phone]. Please Advise  Message sent to Mahlon Gammon, MD

## 2023-10-04 NOTE — Telephone Encounter (Signed)
 I have made her Referal

## 2023-10-21 DIAGNOSIS — M6281 Muscle weakness (generalized): Secondary | ICD-10-CM | POA: Diagnosis not present

## 2023-10-21 DIAGNOSIS — M62442 Contracture of muscle, left hand: Secondary | ICD-10-CM | POA: Diagnosis not present

## 2023-10-28 DIAGNOSIS — M6281 Muscle weakness (generalized): Secondary | ICD-10-CM | POA: Diagnosis not present

## 2023-10-28 DIAGNOSIS — M62442 Contracture of muscle, left hand: Secondary | ICD-10-CM | POA: Diagnosis not present

## 2023-11-01 DIAGNOSIS — M9904 Segmental and somatic dysfunction of sacral region: Secondary | ICD-10-CM | POA: Diagnosis not present

## 2023-11-01 DIAGNOSIS — M9901 Segmental and somatic dysfunction of cervical region: Secondary | ICD-10-CM | POA: Diagnosis not present

## 2023-11-01 DIAGNOSIS — M9903 Segmental and somatic dysfunction of lumbar region: Secondary | ICD-10-CM | POA: Diagnosis not present

## 2023-11-01 DIAGNOSIS — M9902 Segmental and somatic dysfunction of thoracic region: Secondary | ICD-10-CM | POA: Diagnosis not present

## 2023-11-04 DIAGNOSIS — M62442 Contracture of muscle, left hand: Secondary | ICD-10-CM | POA: Diagnosis not present

## 2023-11-04 DIAGNOSIS — M6281 Muscle weakness (generalized): Secondary | ICD-10-CM | POA: Diagnosis not present

## 2023-11-13 DIAGNOSIS — M6281 Muscle weakness (generalized): Secondary | ICD-10-CM | POA: Diagnosis not present

## 2023-11-13 DIAGNOSIS — M62442 Contracture of muscle, left hand: Secondary | ICD-10-CM | POA: Diagnosis not present

## 2023-11-18 DIAGNOSIS — M6281 Muscle weakness (generalized): Secondary | ICD-10-CM | POA: Diagnosis not present

## 2023-11-18 DIAGNOSIS — M62442 Contracture of muscle, left hand: Secondary | ICD-10-CM | POA: Diagnosis not present

## 2023-12-02 ENCOUNTER — Other Ambulatory Visit: Payer: Self-pay | Admitting: Adult Health

## 2023-12-02 NOTE — Telephone Encounter (Signed)
 High risk or very high risk warning populated when attempting to refill medication. RX request sent to PCP for review and approval if warranted.

## 2023-12-11 DIAGNOSIS — M9902 Segmental and somatic dysfunction of thoracic region: Secondary | ICD-10-CM | POA: Diagnosis not present

## 2023-12-11 DIAGNOSIS — M9901 Segmental and somatic dysfunction of cervical region: Secondary | ICD-10-CM | POA: Diagnosis not present

## 2023-12-11 DIAGNOSIS — M9903 Segmental and somatic dysfunction of lumbar region: Secondary | ICD-10-CM | POA: Diagnosis not present

## 2023-12-11 DIAGNOSIS — M9904 Segmental and somatic dysfunction of sacral region: Secondary | ICD-10-CM | POA: Diagnosis not present

## 2023-12-16 DIAGNOSIS — L820 Inflamed seborrheic keratosis: Secondary | ICD-10-CM | POA: Diagnosis not present

## 2024-01-02 DIAGNOSIS — R300 Dysuria: Secondary | ICD-10-CM | POA: Diagnosis not present

## 2024-01-02 DIAGNOSIS — N941 Unspecified dyspareunia: Secondary | ICD-10-CM | POA: Diagnosis not present

## 2024-01-02 DIAGNOSIS — Z113 Encounter for screening for infections with a predominantly sexual mode of transmission: Secondary | ICD-10-CM | POA: Diagnosis not present

## 2024-01-02 DIAGNOSIS — N760 Acute vaginitis: Secondary | ICD-10-CM | POA: Diagnosis not present

## 2024-01-03 DIAGNOSIS — Z9012 Acquired absence of left breast and nipple: Secondary | ICD-10-CM | POA: Diagnosis not present

## 2024-01-03 DIAGNOSIS — C50911 Malignant neoplasm of unspecified site of right female breast: Secondary | ICD-10-CM | POA: Diagnosis not present

## 2024-01-13 ENCOUNTER — Ambulatory Visit: Payer: Medicare PPO | Admitting: Hematology and Oncology

## 2024-01-20 DIAGNOSIS — M9901 Segmental and somatic dysfunction of cervical region: Secondary | ICD-10-CM | POA: Diagnosis not present

## 2024-01-20 DIAGNOSIS — M9902 Segmental and somatic dysfunction of thoracic region: Secondary | ICD-10-CM | POA: Diagnosis not present

## 2024-01-20 DIAGNOSIS — M9903 Segmental and somatic dysfunction of lumbar region: Secondary | ICD-10-CM | POA: Diagnosis not present

## 2024-01-20 DIAGNOSIS — M9904 Segmental and somatic dysfunction of sacral region: Secondary | ICD-10-CM | POA: Diagnosis not present

## 2024-01-23 DIAGNOSIS — R300 Dysuria: Secondary | ICD-10-CM | POA: Diagnosis not present

## 2024-01-23 DIAGNOSIS — N760 Acute vaginitis: Secondary | ICD-10-CM | POA: Diagnosis not present

## 2024-02-03 ENCOUNTER — Inpatient Hospital Stay: Attending: Hematology and Oncology | Admitting: Hematology and Oncology

## 2024-02-03 VITALS — BP 124/70 | HR 92 | Temp 97.6°F | Resp 16 | Ht 64.0 in | Wt 142.3 lb

## 2024-02-03 DIAGNOSIS — Z853 Personal history of malignant neoplasm of breast: Secondary | ICD-10-CM | POA: Insufficient documentation

## 2024-02-03 DIAGNOSIS — Z9013 Acquired absence of bilateral breasts and nipples: Secondary | ICD-10-CM | POA: Diagnosis not present

## 2024-02-03 DIAGNOSIS — Z170 Estrogen receptor positive status [ER+]: Secondary | ICD-10-CM

## 2024-02-03 DIAGNOSIS — C50411 Malignant neoplasm of upper-outer quadrant of right female breast: Secondary | ICD-10-CM

## 2024-02-03 NOTE — Assessment & Plan Note (Signed)
 06/05/2018: Bilateral mastectomies: Right mastectomy: Invasive lobular carcinoma, multifocal, 1.2 cm is the largest tumor, grade 1, margins negative, 0/1 lymph node, ER 100%, PR 70%, HER-2 -1+, Ki-67 5% T1c N0 stage Ia Left mastectomy: Benign Oncotype DX: Score 11, ROR 3%   Current treatment: Tamoxifen  started January 2020 patient is able to tolerate 10 mg only (plan: 5 years) Tamoxifen  toxicities:   1.  Hot flashes have become more manageable 2. Frequent UTIs: Topical Estrace cream twice a day is helping   Breast cancer surveillance: 1.  Chest wall examination 02/03/2024: No palpable lumps or nodules. patient had previous bilateral mastectomies. 2.  No role of imaging studies   Breast cancer index 01/15/23: No benefit for extended endocrine therapy. 1.7% ROR from year 5-10 So she can stop Tamoxifen  by Jan 2025   Return to clinic in 1 year for follow-ups with Long term survivorship clinic.

## 2024-02-03 NOTE — Progress Notes (Signed)
 Patient Care Team: Charlanne Fredia CROME, MD as PCP - General (Internal Medicine) Saintclair Jasper, MD as Consulting Physician (Gastroenterology) Beuford Anes, MD as Consulting Physician (Orthopedic Surgery) Odean Potts, MD as Consulting Physician (Hematology and Oncology) Mat Browning, MD as Consulting Physician (Obstetrics and Gynecology) Patrcia Sharper, MD as Consulting Physician (Ophthalmology) Karis Clunes, MD as Consulting Physician (Otolaryngology)  DIAGNOSIS:  Encounter Diagnosis  Name Primary?   Malignant neoplasm of upper-outer quadrant of right breast in female, estrogen receptor positive (HCC) Yes    SUMMARY OF ONCOLOGIC HISTORY: Oncology History  Malignant neoplasm of upper-outer quadrant of right breast in female, estrogen receptor positive (HCC)  05/05/2018 Initial Diagnosis   Screening detected right breast mass, MRI revealed UIQ 1.5 x 0.8 x 0.8 cm mass.  Additional masses 5 mm size and 7 mm size and several smaller enhancing masses in the right breast, biopsy of the 1.5 cm mass invasive lobular cancer with LCIS ER 20%, PR 20%, Ki-67 less than 1%, HER-2 negative; biopsy of additional masses showed ALH and ILC with LCIS ER 100%, PR 70%, Ki-67 5%, HER-2 1+ negative   05/08/2018 -  Anti-estrogen oral therapy   Tamoxifen , plan 5-10 years   06/05/2018 Surgery   Right mastectomy: Invasive lobular carcinoma, multifocal, 1.2 cm is the largest tumor, grade 1, margins negative, 0/1 lymph node, ER 100%, PR 70%, HER-2 -1+, Ki-67 5% T1c N0 stage Ia Left mastectomy: Benign   06/18/2018 Cancer Staging   Staging form: Breast, AJCC 8th Edition - Pathologic: Stage IA (pT1c(m), pN0, cM0, G1, ER+, PR+, HER2-) - Signed by Crawford Morna Pickle, NP on 06/18/2018   06/19/2018 Oncotype testing   Oncotype: score of 11 with 3% chance of distant recurrence in 9 years with tamoxifen  alone     CHIEF COMPLIANT:*Breast cancer  HISTORY OF PRESENT ILLNESS:  History of Present Illness Lisa Yoder is a 73 year old female who presents for surveillance of breast cancer, complains of recurrent urinary tract infections.  She experiences recurrent urinary tract infections, typically associated with new sexual partners, occurring approximately two to three times a year. She uses estrogen cream twice a week, which initially provided relief, but she is now experiencing another episode. She previously used tamoxifen , which she stopped in January. She completed tamoxifen  therapy by January 2025.     ALLERGIES:  is allergic to bactrim [sulfamethoxazole-trimethoprim].  MEDICATIONS:  Current Outpatient Medications  Medication Sig Dispense Refill   cholecalciferol (VITAMIN D3) 25 MCG (1000 UNIT) tablet Take 1 tablet (1,000 Units total) by mouth daily.     desvenlafaxine  (PRISTIQ ) 50 MG 24 hr tablet Take 1 tablet (50 mg total) by mouth daily. 30 tablet 3   estradiol (ESTRACE) 0.1 MG/GM vaginal cream Place 1 Applicatorful vaginally 2 (two) times a week.     Hypromellose (ARTIFICIAL TEARS OP) Place 1 drop into both eyes daily in the afternoon.     losartan  (COZAAR ) 25 MG tablet Take 1 tablet (25 mg total) by mouth every evening. 90 tablet 3   losartan  (COZAAR ) 50 MG tablet TAKE ONE TABLET BY MOUTH DAILY 90 tablet 1   Magnesium  200 MG TABS Take 1 tablet by mouth daily.     Multiple Vitamins-Minerals (PRESERVISION AREDS) CAPS Take 1 capsule by mouth 2 (two) times daily.      nitrofurantoin  (MACRODANTIN ) 50 MG capsule TAKE ONE CAPSULE BY MOUTH DAILY AS NEEDED 30 capsule 5   NON FORMULARY Take 1 capsule by mouth 2 (two) times daily. Bio-Complete 3  NON FORMULARY Symplex F 2 per day strengthen bone     Omega-3 Fatty Acids (OMEGA 3 PO) Take 1 capsule by mouth daily.      Turmeric (QC TUMERIC COMPLEX PO) Take 5 mLs by mouth daily.     No current facility-administered medications for this visit.    PHYSICAL EXAMINATION: ECOG PERFORMANCE STATUS: 1 - Symptomatic but completely  ambulatory  Vitals:   02/03/24 1516 02/03/24 1520  BP: (!) 144/78 124/70  Pulse: 91 92  Resp: 16   Temp: 97.6 F (36.4 C)   SpO2: 97%    Filed Weights   02/03/24 1516  Weight: 142 lb 4.8 oz (64.5 kg)    Physical Exam EXTREMITIES: No edema. Chest exam: No palpable lumps or nodules of bilateral chest wall or axilla  (exam performed in the presence of a chaperone)  LABORATORY DATA:  I have reviewed the data as listed    Latest Ref Rng & Units 02/28/2023    8:00 AM 08/30/2022    8:11 AM 05/22/2022    2:19 PM  CMP  Glucose 65 - 99 mg/dL 78  86  98   BUN 7 - 25 mg/dL 26  19  24    Creatinine 0.60 - 1.00 mg/dL 9.03  9.06  9.00   Sodium 135 - 146 mmol/L 141  141  137   Potassium 3.5 - 5.3 mmol/L 4.5  4.5  4.1   Chloride 98 - 110 mmol/L 107  108  103   CO2 20 - 32 mmol/L 23  26  29    Calcium 8.6 - 10.4 mg/dL 9.4  9.2  9.5   Total Protein 6.1 - 8.1 g/dL 6.4  6.3    Total Bilirubin 0.2 - 1.2 mg/dL 0.4  0.5    AST 10 - 35 U/L 18  17    ALT 6 - 29 U/L 14  13      Lab Results  Component Value Date   WBC 6.1 02/28/2023   HGB 12.1 02/28/2023   HCT 36.5 02/28/2023   MCV 89.5 02/28/2023   PLT 232 02/28/2023   NEUTROABS 2,922 02/28/2023    ASSESSMENT & PLAN:  Malignant neoplasm of upper-outer quadrant of right breast in female, estrogen receptor positive (HCC) 06/05/2018: Bilateral mastectomies: Right mastectomy: Invasive lobular carcinoma, multifocal, 1.2 cm is the largest tumor, grade 1, margins negative, 0/1 lymph node, ER 100%, PR 70%, HER-2 -1+, Ki-67 5% T1c N0 stage Ia Left mastectomy: Benign Oncotype DX: Score 11, ROR 3%   Current treatment: Tamoxifen  started January 2020 patient is able to tolerate 10 mg only (plan: 5 years) Tamoxifen  toxicities:   1.  Hot flashes have become more manageable 2. Frequent UTIs: Topical Estrace cream twice a day is helping   Breast cancer surveillance: 1.  Chest wall examination 02/03/2024: No palpable lumps or nodules. patient had  previous bilateral mastectomies. 2.  No role of imaging studies   Breast cancer index 01/15/23: No benefit for extended endocrine therapy. 1.7% ROR from year 5-10 So she can stop Tamoxifen  by Jan 2025   Return to clinic on an as-needed basis     No orders of the defined types were placed in this encounter.  The patient has a good understanding of the overall plan. she agrees with it. she will call with any problems that may develop before the next visit here. Total time spent: 30 mins including face to face time and time spent for planning, charting and co-ordination of care  Viinay K Mikyla Schachter, MD 02/03/24

## 2024-02-20 DIAGNOSIS — H353132 Nonexudative age-related macular degeneration, bilateral, intermediate dry stage: Secondary | ICD-10-CM | POA: Diagnosis not present

## 2024-02-20 DIAGNOSIS — H353211 Exudative age-related macular degeneration, right eye, with active choroidal neovascularization: Secondary | ICD-10-CM | POA: Diagnosis not present

## 2024-02-20 DIAGNOSIS — H3561 Retinal hemorrhage, right eye: Secondary | ICD-10-CM | POA: Diagnosis not present

## 2024-02-20 DIAGNOSIS — H04123 Dry eye syndrome of bilateral lacrimal glands: Secondary | ICD-10-CM | POA: Diagnosis not present

## 2024-02-20 DIAGNOSIS — H353122 Nonexudative age-related macular degeneration, left eye, intermediate dry stage: Secondary | ICD-10-CM | POA: Diagnosis not present

## 2024-02-21 ENCOUNTER — Emergency Department (HOSPITAL_BASED_OUTPATIENT_CLINIC_OR_DEPARTMENT_OTHER)

## 2024-02-21 ENCOUNTER — Encounter (HOSPITAL_BASED_OUTPATIENT_CLINIC_OR_DEPARTMENT_OTHER): Payer: Self-pay | Admitting: Emergency Medicine

## 2024-02-21 ENCOUNTER — Other Ambulatory Visit: Payer: Self-pay

## 2024-02-21 ENCOUNTER — Emergency Department (HOSPITAL_BASED_OUTPATIENT_CLINIC_OR_DEPARTMENT_OTHER)
Admission: EM | Admit: 2024-02-21 | Discharge: 2024-02-21 | Disposition: A | Discharge status: Home / Self Care | Attending: Emergency Medicine | Admitting: Emergency Medicine

## 2024-02-21 DIAGNOSIS — H02841 Edema of right upper eyelid: Secondary | ICD-10-CM | POA: Insufficient documentation

## 2024-02-21 DIAGNOSIS — H5711 Ocular pain, right eye: Secondary | ICD-10-CM | POA: Diagnosis not present

## 2024-02-21 DIAGNOSIS — R9082 White matter disease, unspecified: Secondary | ICD-10-CM | POA: Diagnosis not present

## 2024-02-21 DIAGNOSIS — H5710 Ocular pain, unspecified eye: Secondary | ICD-10-CM | POA: Diagnosis not present

## 2024-02-21 DIAGNOSIS — R519 Headache, unspecified: Secondary | ICD-10-CM | POA: Insufficient documentation

## 2024-02-21 DIAGNOSIS — G319 Degenerative disease of nervous system, unspecified: Secondary | ICD-10-CM | POA: Diagnosis not present

## 2024-02-21 DIAGNOSIS — I6782 Cerebral ischemia: Secondary | ICD-10-CM | POA: Diagnosis not present

## 2024-02-21 LAB — CBC WITH DIFFERENTIAL/PLATELET
Abs Immature Granulocytes: 0.01 K/uL (ref 0.00–0.07)
Basophils Absolute: 0 K/uL (ref 0.0–0.1)
Basophils Relative: 1 %
Eosinophils Absolute: 0.2 K/uL (ref 0.0–0.5)
Eosinophils Relative: 4 %
HCT: 37.4 % (ref 36.0–46.0)
Hemoglobin: 12.2 g/dL (ref 12.0–15.0)
Immature Granulocytes: 0 %
Lymphocytes Relative: 30 %
Lymphs Abs: 1.8 K/uL (ref 0.7–4.0)
MCH: 29.9 pg (ref 26.0–34.0)
MCHC: 32.6 g/dL (ref 30.0–36.0)
MCV: 91.7 fL (ref 80.0–100.0)
Monocytes Absolute: 0.7 K/uL (ref 0.1–1.0)
Monocytes Relative: 11 %
Neutro Abs: 3.2 K/uL (ref 1.7–7.7)
Neutrophils Relative %: 54 %
Platelets: 227 K/uL (ref 150–400)
RBC: 4.08 MIL/uL (ref 3.87–5.11)
RDW: 13.8 % (ref 11.5–15.5)
WBC: 6 K/uL (ref 4.0–10.5)
nRBC: 0 % (ref 0.0–0.2)

## 2024-02-21 LAB — BASIC METABOLIC PANEL WITH GFR
Anion gap: 11 (ref 5–15)
BUN: 21 mg/dL (ref 8–23)
CO2: 25 mmol/L (ref 22–32)
Calcium: 10.3 mg/dL (ref 8.9–10.3)
Chloride: 106 mmol/L (ref 98–111)
Creatinine, Ser: 1.16 mg/dL — ABNORMAL HIGH (ref 0.44–1.00)
GFR, Estimated: 49 mL/min — ABNORMAL LOW (ref >60)
Glucose, Bld: 110 mg/dL — ABNORMAL HIGH (ref 70–99)
Potassium: 4.1 mmol/L (ref 3.5–5.1)
Sodium: 142 mmol/L (ref 135–145)

## 2024-02-21 MED ORDER — DIPHENHYDRAMINE HCL 50 MG/ML IJ SOLN
12.5000 mg | Freq: Once | INTRAMUSCULAR | Status: AC
  Administered 2024-02-21: 12.5 mg via INTRAVENOUS
  Filled 2024-02-21: qty 1

## 2024-02-21 MED ORDER — PROCHLORPERAZINE EDISYLATE 10 MG/2ML IJ SOLN
10.0000 mg | Freq: Once | INTRAMUSCULAR | Status: AC
  Administered 2024-02-21: 10 mg via INTRAVENOUS
  Filled 2024-02-21: qty 2

## 2024-02-21 MED ORDER — IOHEXOL 300 MG/ML  SOLN
75.0000 mL | Freq: Once | INTRAMUSCULAR | Status: AC | PRN
  Administered 2024-02-21: 75 mL via INTRAVENOUS

## 2024-02-21 MED ORDER — FLUORESCEIN SODIUM 1 MG OP STRP
1.0000 | ORAL_STRIP | Freq: Once | OPHTHALMIC | Status: AC
  Administered 2024-02-21: 1 via OPHTHALMIC
  Filled 2024-02-21: qty 1

## 2024-02-21 MED ORDER — ERYTHROMYCIN 5 MG/GM OP OINT: TOPICAL_OINTMENT | OPHTHALMIC | 0 refills | Status: DC

## 2024-02-21 MED ORDER — TETRACAINE HCL 0.5 % OP SOLN
2.0000 [drp] | Freq: Once | OPHTHALMIC | Status: AC
  Administered 2024-02-21: 2 [drp] via OPHTHALMIC
  Filled 2024-02-21: qty 4

## 2024-02-21 NOTE — ED Triage Notes (Addendum)
 Pt caox4 ambulatory reporting yesterday she had injection put in R eye at retinal specialist at approx 1630 and that she had pain and swelling throughout the evening, c/o headache since this morning.

## 2024-02-21 NOTE — ED Provider Notes (Signed)
 [place] EMERGENCY DEPARTMENT AT [facility] Provider Note   CSN: [id number] Arrival date & time: 02/21/24  1322    Patient presents with: Headache  Lisa Yoder is a 73 y.o. female   Here for evaluation of headache and eye pain.  Yesterday morning she woke up and had blurriness to her right eye with some redness.  She was seen by her ophthalmologist who subsequently sent her to Dr. Elner, retinal specialist.  She was diagnosed with wet macular degeneration and hemorrhage.  He performed an injection to her eye.  She is post have surgery on Wednesday.  Since then she has had a headache, pain to the eye and some mild edema and erythema to her upper eyelid.  She actually states her vision has improved in her right eye.  No diplopia, numbness, weakness, slurred speech, neck pain, fever, nausea, vomiting.  She has never had anything like this previously.   HPI     Prior to Admission medications   Medication Sig Start Date End Date Taking? Authorizing Provider  erythromycin  ophthalmic ointment Place a 1/2 inch ribbon of ointment into the lower eyelid. 02/21/24  Yes Leeona Mccardle A, PA-C  Boric Acid 600 MG SUPP 600 mg. 01/24/24   [provider]  cholecalciferol (VITAMIN D3) 25 MCG (1000 UNIT) tablet Take 1 tablet (1,000 Units total) by mouth daily. 01/09/22   Gudena, Vinay, MD  desvenlafaxine  (PRISTIQ ) 50 MG 24 hr tablet Take 1 tablet (50 mg total) by mouth daily. 02/18/19   Gupta, Anjali L, MD  estradiol (ESTRACE) 0.1 MG/GM vaginal cream Place 1 Applicatorful vaginally 2 (two) times a week.    [provider]  Hypromellose (ARTIFICIAL TEARS OP) Place 1 drop into both eyes daily in the afternoon.    [provider]  losartan  (COZAAR ) 25 MG tablet Take 1 tablet (25 mg total) by mouth every evening. 12/02/23   Charlanne Fredia CROME, MD  losartan  (COZAAR ) 50 MG tablet TAKE ONE TABLET BY MOUTH DAILY 08/26/23   Charlanne Fredia CROME, MD  Magnesium  200 MG TABS Take 1 tablet  by mouth daily.    [provider]  Multiple Vitamins-Minerals (PRESERVISION AREDS) CAPS Take 1 capsule by mouth 2 (two) times daily.     [provider]  nitrofurantoin  (MACRODANTIN ) 50 MG capsule TAKE ONE CAPSULE BY MOUTH DAILY AS NEEDED 11/27/22   Fargo, Amy E, NP  NON FORMULARY Take 1 capsule by mouth 2 (two) times daily. Bio-Complete 3    [provider]  NON FORMULARY Symplex F 2 per day strengthen bone    [provider]  Omega-3 Fatty Acids (OMEGA 3 PO) Take 1 capsule by mouth daily.     [provider]  Turmeric (QC TUMERIC COMPLEX PO) Take 5 mLs by mouth daily.    [provider]    Allergies: Bactrim [sulfamethoxazole-trimethoprim]    Review of Systems  Constitutional: Negative.   HENT: Negative.    Eyes:  Positive for pain and redness. Negative for photophobia, discharge, itching and visual disturbance.  Respiratory: Negative.    Cardiovascular: Negative.   Gastrointestinal: Negative.   Musculoskeletal: Negative.   Skin: Negative.   Neurological:  Positive for headaches.  All other systems reviewed and are negative.   Updated Vital Signs BP 135/77   Pulse 70   Temp (!) 97.5 F (36.4 C)   Resp 16   SpO2 96%   Physical Exam Eyes:     Physical Exam  Constitutional: Pt is oriented  to person, place, and time. Pt appears well-developed and well-nourished. No distress.  HENT:  Head: Normocephalic and atraumatic.  Nontender temporal region, Mouth/Throat: Oropharynx is clear and moist.  Eyes: Conjunctivae and EOM are normal. Pupils are equal, round, and reactive to light. No scleral icterus. Mild upper lid edema with mild erythema, IOP right 20-22, left 19-21, no fluorescein  uptake, negative Seidel sign No horizontal, vertical or rotational nystagmus  Neck: Normal range of motion. Neck supple.  Full active and passive ROM without pain No midline or paraspinal tenderness No nuchal rigidity or meningeal signs   Cardiovascular: Normal rate, regular rhythm and intact distal pulses.   Pulmonary/Chest: Effort normal and breath sounds normal. No respiratory distress. Pt has no wheezes. No rales.  Abdominal: Soft. Bowel sounds are normal. There is no tenderness. There is no rebound and no guarding.  Musculoskeletal: Normal range of motion.  Neurological: Pt. is alert and oriented to person, place, and time. He has normal reflexes. No cranial nerve deficit.  Exhibits normal muscle tone. Coordination normal.  Mental Status:  Alert, oriented, thought content appropriate. Speech fluent without evidence of aphasia. Able to follow 2 step commands without difficulty.  Cranial Nerves:  2-12 grossly intact Motor:  Equal strength in BL upper and lower extremities Sensory: Intact sensation Deep Tendon Reflexes: Equal Cerebellar: normal F2N Gait: normal gait and balance CV: distal pulses palpable throughout   Skin: Skin is warm and dry. No rash noted. Pt is not diaphoretic.  Psychiatric: Pt has a normal mood and affect. Behavior is normal. Judgment and thought content normal.  Nursing note and vitals reviewed.  (all labs ordered are listed, but only abnormal results are displayed) Labs Reviewed  BASIC METABOLIC PANEL WITH GFR - Abnormal; Notable for the following components:      Result Value   Glucose, Bld 110 (*)    Creatinine, Ser 1.16 (*)    GFR, Estimated 49 (*)    All other components within normal limits  CBC WITH DIFFERENTIAL/PLATELET    EKG: None  Radiology: CT Head Wo Contrast Result Date: 02/21/2024 CLINICAL DATA:  Provided history: Headache, new onset. Additional history provided: The patient reports a right eye injection yesterday with right eye pain today. EXAM: CT HEAD WITHOUT CONTRAST. CT ORBITS WITH CONTRAST. TECHNIQUE: Contiguous axial images were obtained from the base of the skull through the vertex with without contrast. Multidetector CT imaging of the orbits was performed using the  standard protocol with intravenous contrast. RADIATION DOSE REDUCTION: This exam was performed according to the departmental dose-optimization program which includes automated exposure control, adjustment of the mA and/or kV according to patient size and/or use of iterative reconstruction technique. CONTRAST:  75mL OMNIPAQUE  IOHEXOL  300 MG/ML  SOLN COMPARISON:  Brain MRI 04/01/2020.  MRI orbits 08/13/2016. FINDINGS: CT HEAD FINDINGS Brain: Mild generalized parenchymal atrophy. Mild cerebral white matter disease, better appreciated on the prior MRI of 04/01/2020. There is no acute intracranial hemorrhage. No demarcated cortical infarct. No extra-axial fluid collection. No evidence of an intracranial mass. No midline shift. Vascular: No hyperdense vessel.  Atherosclerotic calcifications. Skull: No calvarial fracture or aggressive osseous lesion. CT ORBITS FINDINGS Orbits: Two small foci of gas along the anterior aspect of the right globe (for instance as seen on series 2, image 21). Prior bilateral ocular lens replacement. The globes are normal in size and contour. The extraocular muscles, optic nerve sheath complexes and lacrimal glands are symmetric and unremarkable. No orbital mass or pathologic orbital enhancement. Visualized sinuses: Mild mucosal  thickening within the bilateral sphenoid sinuses. Soft tissues: Unremarkable appearance of the periorbital soft tissues. 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 IMPRESSION: CT head: 1.  No evidence of an acute intracranial abnormality. 2. Parenchymal atrophy and chronic small vessel ischemic disease. CT orbits: 1. Two small foci of gas along the anterior aspect of the right globe, possibly related to the reported recent injection. Alternatively, this may reflect a small amount of air beneath the right eyelid. 2. Prior bilateral ocular lens replacement. 3. Otherwise unremarkable CT appearance of the  orbits. 4. Mild mucosal thickening within the bilateral sphenoid sinuses. 5. 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 Please refer to this prior examination for further description. Electronically Signed   By: Rockey Childs D.O.   On: 02/21/2024 15:43   CT Orbits W Contrast Result Date: 02/21/2024 CLINICAL DATA:  Provided history: Headache, new onset. Additional history provided: The patient reports a right eye injection yesterday with right eye pain today. EXAM: CT HEAD WITHOUT CONTRAST. CT ORBITS WITH CONTRAST. TECHNIQUE: Contiguous axial images were obtained from the base of the skull through the vertex with without contrast. Multidetector CT imaging of the orbits was performed using the standard protocol with intravenous contrast. RADIATION DOSE REDUCTION: This exam was performed according to the departmental dose-optimization program which includes automated exposure control, adjustment of the mA and/or kV according to patient size and/or use of iterative reconstruction technique. CONTRAST:  75mL OMNIPAQUE  IOHEXOL  300 MG/ML  SOLN COMPARISON:  Brain MRI 04/01/2020.  MRI orbits 08/13/2016. FINDINGS: CT HEAD FINDINGS Brain: Mild generalized parenchymal atrophy. Mild cerebral white matter disease, better appreciated on the prior MRI of 04/01/2020. There is no acute intracranial hemorrhage. No demarcated cortical infarct. No extra-axial fluid collection. No evidence of an intracranial mass. No midline shift. Vascular: No hyperdense vessel.  Atherosclerotic calcifications. Skull: No calvarial fracture or aggressive osseous lesion. CT ORBITS FINDINGS Orbits: Two small foci of gas along the anterior aspect of the right globe (for instance as seen on series 2, image 21). Prior bilateral ocular lens replacement. The globes are normal in size and contour. The extraocular muscles, optic nerve sheath complexes and lacrimal glands are symmetric and  unremarkable. No orbital mass or pathologic orbital enhancement. Visualized sinuses: Mild mucosal thickening within the bilateral sphenoid sinuses. Soft tissues: Unremarkable appearance of the periorbital soft tissues. 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 IMPRESSION: CT head: 1.  No evidence of an acute intracranial abnormality. 2. Parenchymal atrophy and chronic small vessel ischemic disease. CT orbits: 1. Two small foci of gas along the anterior aspect of the right globe, possibly related to the reported recent injection. Alternatively, this may reflect a small amount of air beneath the right eyelid. 2. Prior bilateral ocular lens replacement. 3. Otherwise unremarkable CT appearance of the orbits. 4. Mild mucosal thickening within the bilateral sphenoid sinuses. 5. 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 Please refer to this prior examination for further description. Electronically Signed   By: Rockey Childs D.O.   On: 02/21/2024 15:43     Ultrasound ED Ocular  Date/Time: 02/21/2024 2:55 PM  Performed by: Edie Rosebud LABOR, PA-C Authorized by: Edie Rosebud A, PA-C   PROCEDURE DETAILS:    Indications: evaluation for increased intracranial pressure and eye pain  Assessed:  Right eye   Right eye axial view: obtained     Right eye sagittal view: obtained     Images: archived     Limitations:  None RIGHT EYE FINDINGS:     no foreign body noted in right eye    right eye lens not dislodged    no right eye increased optic nerve sheath diameter    no retrobulbar hematoma in right eye    no evidence of retinal detachment of the right eye    no ruptured globe in right eye    Medications Ordered in the ED  prochlorperazine  (COMPAZINE ) injection 10 mg (10 mg Intravenous Given 02/21/24 1431)  diphenhydrAMINE  (BENADRYL ) injection 12.5 mg (12.5 mg Intravenous  Given 02/21/24 1429)  tetracaine  (PONTOCAINE) 0.5 % ophthalmic solution 2 drop (2 drops Right Eye Given 02/21/24 1427)  fluorescein  ophthalmic strip 1 strip (1 strip Right Eye Given 02/21/24 1427)  iohexol  (OMNIPAQUE ) 300 MG/ML solution 75 mL (75 mLs Intravenous Contrast Given 02/21/24 8123)   73 year old here for evaluation of headache and right eye pain.  Yesterday developed blurred vision subsequently seen by the retinal specialist diagnosed with macular degeneration and hemorrhage and had an injection performed to her right eye.  She states since then she has had headache and pain to the eye.  She woke up this morning and her upper eyelid was mildly edematous.  She actually states her vision has improved since yesterday.  She has a nonfocal neuroexam without deficits.  Negative Seidel sign.  IOP's bilateral within normal limits ultrasound neg for detachment.  Patient states she was sent by urgent care for CT imaging.  Will plan on labs, imaging, migraine cocktail  Labs and imaging personally viewed and interpreted:  CBC without leukocytosis Metabolic panel creatinine 1.1 CT head without acute abnormality CT orbit small foci of gas right eye could be due to recent instrumentation  Patient reassessed.  Headache eye pain resolved.  Will discuss with ophthalmology.  No large corneal abrasion, possible punctate area right 7 o'clock position.  No dendritic lesions.  Given recent surgery will treat with erythromycin  eye ointment.  Will have her follow-up with ophthalmology.  She will return for any worsening symptoms.  Low suspicion for retinal hemorrhage, detachment, acute angle glaucoma, central retinal artery occlusion, temporal arteritis, giant cell arteritis, IIH, SAH, globe rupture, iritis, orbital/periorbital cellulitis, retrobulbar hematoma.   The patient has been appropriately medically screened and/or stabilized in the ED. I have low suspicion for any other emergent medical condition which would  require further screening, evaluation or treatment in the ED or require inpatient management.  Patient is hemodynamically stable and in no acute distress.  Patient able to ambulate in department prior to ED.  Evaluation does not show acute pathology that would require ongoing or additional emergent interventions while in the emergency department or further inpatient treatment.  I have discussed the diagnosis with the patient and answered all questions.  Pain is been managed while in the emergency department and patient has no further complaints prior to discharge.  Patient is comfortable with plan discussed in room and is stable for discharge at this time.  I have discussed strict return precautions for returning to the emergency department.  Patient was encouraged to follow-up with PCP/specialist refer to at discharge.   Clinical Course as of 02/21/24 1641  Fri Feb 21, 2024  1557 Discussed with Dr. Medford Gaudy with ophthalmology who recommends artificial tears. Can FU if persistent pain [BH]  Clinical Course User Index [BH] Tyreek Clabo A, PA-C                                 Medical Decision Making Amount and/or Complexity of Data Reviewed External Data Reviewed: labs, radiology and notes. Labs: ordered. Decision-making details documented in ED Course. Radiology: ordered and independent interpretation performed. Decision-making details documented in ED Course.  Risk OTC drugs. Prescription drug management. Decision regarding hospitalization. Diagnosis or treatment significantly limited by social determinants of health.       Final diagnoses:  Pain of right eye    ED Discharge Orders          Ordered    erythromycin  ophthalmic ointment        02/21/24 1630               Liliana Dang A, PA-C 02/21/24 1642    Freddi Hamilton, MD 02/22/24 [phone]

## 2024-02-21 NOTE — Discharge Instructions (Signed)
 It was a taking care of you here today  The ophthalmologist recommends using artificial tears.  If you are still having pain tomorrow call the office  May also use the antibiotic eye ointment  Make sure to follow-up outpatient, return for any worsening symptoms

## 2024-02-25 ENCOUNTER — Telehealth: Payer: Self-pay

## 2024-02-25 NOTE — Telephone Encounter (Signed)
 Message routed to PCP Charlanne Fredia CROME, MD . Please advise is patient recent labs are fine or would you like patient to reschedule labs after surgery?

## 2024-02-25 NOTE — Telephone Encounter (Signed)
 Copied from CRM [id number]. Topic: General - Other >> Feb 25, 2024 12:13 PM Adrianna P wrote: Reason for CRM: patient has to cancel lab appt due to surgery, she wants to know if the lab work from the hospital on the 8th will do. Can be reached at [phone]

## 2024-02-26 DIAGNOSIS — H3561 Retinal hemorrhage, right eye: Secondary | ICD-10-CM | POA: Diagnosis not present

## 2024-02-26 DIAGNOSIS — H35731 Hemorrhagic detachment of retinal pigment epithelium, right eye: Secondary | ICD-10-CM | POA: Diagnosis not present

## 2024-02-26 DIAGNOSIS — H353211 Exudative age-related macular degeneration, right eye, with active choroidal neovascularization: Secondary | ICD-10-CM | POA: Diagnosis not present

## 2024-02-26 NOTE — Telephone Encounter (Signed)
 Patient lab appointment was cancelled yesterday.

## 2024-02-26 NOTE — Telephone Encounter (Signed)
 Lets cancel her Lab appointment for now.

## 2024-02-27 ENCOUNTER — Other Ambulatory Visit: Payer: Medicare PPO

## 2024-03-02 ENCOUNTER — Other Ambulatory Visit: Payer: Self-pay | Admitting: Internal Medicine

## 2024-03-02 DIAGNOSIS — I10 Essential (primary) hypertension: Secondary | ICD-10-CM

## 2024-03-02 NOTE — Telephone Encounter (Signed)
 High risk warning

## 2024-03-03 DIAGNOSIS — H3561 Retinal hemorrhage, right eye: Secondary | ICD-10-CM | POA: Diagnosis not present

## 2024-03-03 DIAGNOSIS — H04123 Dry eye syndrome of bilateral lacrimal glands: Secondary | ICD-10-CM | POA: Diagnosis not present

## 2024-03-03 DIAGNOSIS — H353132 Nonexudative age-related macular degeneration, bilateral, intermediate dry stage: Secondary | ICD-10-CM | POA: Diagnosis not present

## 2024-03-03 DIAGNOSIS — H353211 Exudative age-related macular degeneration, right eye, with active choroidal neovascularization: Secondary | ICD-10-CM | POA: Diagnosis not present

## 2024-03-04 ENCOUNTER — Non-Acute Institutional Stay: Payer: Medicare PPO | Admitting: Internal Medicine

## 2024-03-04 ENCOUNTER — Encounter: Payer: Self-pay | Admitting: Internal Medicine

## 2024-03-04 VITALS — BP 136/80 | HR 78 | Temp 97.9°F | Ht 64.0 in | Wt 142.3 lb

## 2024-03-04 DIAGNOSIS — M9903 Segmental and somatic dysfunction of lumbar region: Secondary | ICD-10-CM | POA: Diagnosis not present

## 2024-03-04 DIAGNOSIS — N390 Urinary tract infection, site not specified: Secondary | ICD-10-CM | POA: Diagnosis not present

## 2024-03-04 DIAGNOSIS — M9901 Segmental and somatic dysfunction of cervical region: Secondary | ICD-10-CM | POA: Diagnosis not present

## 2024-03-04 DIAGNOSIS — M9902 Segmental and somatic dysfunction of thoracic region: Secondary | ICD-10-CM | POA: Diagnosis not present

## 2024-03-04 DIAGNOSIS — F331 Major depressive disorder, recurrent, moderate: Secondary | ICD-10-CM | POA: Diagnosis not present

## 2024-03-04 DIAGNOSIS — I10 Essential (primary) hypertension: Secondary | ICD-10-CM | POA: Diagnosis not present

## 2024-03-04 DIAGNOSIS — M9904 Segmental and somatic dysfunction of sacral region: Secondary | ICD-10-CM | POA: Diagnosis not present

## 2024-03-04 DIAGNOSIS — G250 Essential tremor: Secondary | ICD-10-CM | POA: Diagnosis not present

## 2024-03-04 NOTE — Patient Instructions (Addendum)
 Please visit your local pharmacy to receive you Covid and Flu vaccines.  Please have your labs done here at [facility] on 2//9/26 @ 7:45-8:15

## 2024-03-04 NOTE — Progress Notes (Signed)
 Location:  [facility] of Service:  Clinic (12)  Provider:   Code Status:  Goals of Care:     02/03/2024    3:20 PM  Advanced Directives  Does Patient Have a Medical Advance Directive? Yes  Type of Advance Directive Living will;Healthcare Power of Attorney  Copy of Healthcare Power of Attorney in Chart? No - copy requested     Chief Complaint  Patient presents with   Medical Management of Chronic Issues    Follow up. Patient was unable to have labs done due to being in hospital. Retinal eye surgery on last Wednesday.    HPI: Patient is a 73 y.o. female seen today for medical management of chronic diseases.   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 A retinal specialist performed a procedure to clear blood from the retina. She had a reaction to an injection, resulting in an emergency room visit. Her vision remains impaired with a persistent black bubble in the center, affecting her ability to see the eye chart. She is concerned about macular degeneration progressing to the wet form and is considering a new light treatment for her left eye.  Recurent UTI with Vaginitis She manages urinary tract infections with boric acid suppositories, estrogen cream, and cranberry supplements. She takes nitrofurantoin  as needed.  Low back pain  She has discontinued meloxicam  and lorazepam , ' Depression continues Pristiq  without issues, and manages back pain well. Intense leg cramps are relieved with a magnesium  and melatonin supplement taken at night.  She is concerned about weight gain due to her inability to exercise because of her eye condition. She maintains a mindful diet and is not considering medication for weight management. Her kidney function is stable despite a history of nephritis. She is up to date with vaccinations and plans to receive the COVID-19 vaccine.      .  Past  Medical History:  Diagnosis Date   Anxiety    Arthritis    back (06/05/2018)   Breast cancer, right breast (HCC) 03/2018   Chronic lower back pain    if I don't do yoga (06/05/2018)   Depression    Family history of breast cancer    GERD (gastroesophageal reflux disease)    Herpes simplex    on nose   History of hiatal hernia    Macular degeneration    Per [facility] New Patient Packet    Nephritis    as a child (06/05/2018)   Osteoporosis    SCC (squamous cell carcinoma)    under my nose (06/05/2018)   Strabismus    Per [facility] new patient packet    UTI (urinary tract infection) 8/15   Wears glasses     Past Surgical History:  Procedure Laterality Date   BREAST BIOPSY Right 04/2018   CATARACT EXTRACTION W/ INTRAOCULAR LENS  IMPLANT, BILATERAL Bilateral    CESAREAN SECTION  1977   COLONOSCOPY     CYST REMOVAL NECK Left 03/03/2014   Procedure: LEFT NECK CYST EXCISION;  Surgeon: Donnice KATHEE Lunger, MD;  Location: [facility];  Service: General;  Laterality: Left;   DILATION AND CURETTAGE OF UTERUS     EYE SURGERY     Strabismus   FINGER SURGERY Left 02/2023   MASTECTOMY Left 06/05/2018   MASTECTOMY COMPLETE / SIMPLE W/ SENTINEL NODE BIOPSY Right 06/05/2018   AND BLUE  DYE INJECTION RIGHT BREAST   MASTECTOMY W/ SENTINEL NODE BIOPSY Bilateral 06/05/2018   Procedure: BILATERAL MASTECTOMIES WITH RIGHT SENTINEL LYMPH NODE BIOPSY AND BLUE DYE INJECTION RIGHT BREAST;  Surgeon: Belinda Cough, MD;  Location: [facility] OR;  Service: General;  Laterality: Bilateral;   SQUAMOUS CELL CARCINOMA EXCISION     under my nose   STRABISMUS SURGERY Bilateral 05/02/2018   Procedure: BILATERAL REPAIR STRABISMUS;  Surgeon: Tobie Factor, MD;  Location: [facility];  Service: Ophthalmology;  Laterality: Bilateral;   TONSILLECTOMY AND ADENOIDECTOMY     UPPER GI ENDOSCOPY     WISDOM TOOTH EXTRACTION      Allergies  Allergen Reactions   Bactrim  [Sulfamethoxazole-Trimethoprim] Anaphylaxis, Hives and Rash   Betadine [Povidone Iodine] Swelling    Burning and irritation   Benzocaine Rash    Outpatient Encounter Medications as of 03/04/2024  Medication Sig   Boric Acid 600 MG SUPP 600 mg.   cholecalciferol (VITAMIN D3) 25 MCG (1000 UNIT) tablet Take 1 tablet (1,000 Units total) by mouth daily.   Cranberry (ELLURA PO) Take 1 tablet by mouth daily.   desvenlafaxine  (PRISTIQ ) 50 MG 24 hr tablet Take 1 tablet (50 mg total) by mouth daily.   estradiol (ESTRACE) 0.1 MG/GM vaginal cream Place 1 Applicatorful vaginally 2 (two) times a week.   fluticasone  (VERAMYST) 27.5 MCG/SPRAY nasal spray Place 1 spray into the nose as needed for rhinitis.   Hypromellose (ARTIFICIAL TEARS OP) Place 1 drop into both eyes daily in the afternoon.   losartan  (COZAAR ) 25 MG tablet Take 1 tablet (25 mg total) by mouth every evening.   losartan  (COZAAR ) 50 MG tablet TAKE ONE TABLET BY MOUTH DAILY   Multiple Vitamins-Minerals (PRESERVISION AREDS) CAPS Take 1 capsule by mouth 2 (two) times daily.    nitrofurantoin  (MACRODANTIN ) 50 MG capsule TAKE ONE CAPSULE BY MOUTH DAILY AS NEEDED   NON FORMULARY Take 1 capsule by mouth 2 (two) times daily. Bio-Complete 3   NON FORMULARY Symplex F 2 per day strengthen bone   Omega-3 Fatty Acids (OMEGA 3 PO) Take 1 capsule by mouth daily.    Turmeric (QC TUMERIC COMPLEX PO) Take 5 mLs by mouth daily.   UNABLE TO FIND Take 2 capsules by mouth daily. Med Name: MAG R&R   UNABLE TO FIND Take 2 tablets by mouth daily. Med Name: LEPTICELL   UNABLE TO FIND Take 1 tablet by mouth daily. Med Name: SAFRON   [DISCONTINUED] erythromycin  ophthalmic ointment Place a 1/2 inch ribbon of ointment into the lower eyelid. (Patient not taking: Reported on 03/04/2024)   [DISCONTINUED] Magnesium  200 MG TABS Take 1 tablet by mouth daily. (Patient not taking: Reported on 03/04/2024)   No facility-administered encounter medications on file as of 03/04/2024.     Review of Systems:  Review of Systems  Constitutional:  Negative for activity change and appetite change.  HENT: Negative.    Eyes:  Positive for visual disturbance.  Respiratory:  Negative for cough and shortness of breath.   Cardiovascular:  Negative for leg swelling.  Gastrointestinal:  Negative for constipation.  Genitourinary: Negative.   Musculoskeletal:  Negative for arthralgias, gait problem and myalgias.  Skin: Negative.   Neurological:  Negative for dizziness and weakness.  Psychiatric/Behavioral:  Negative for confusion, dysphoric mood and sleep disturbance.     Health Maintenance  Topic Date Due   Hepatitis C Screening  Never done   Medicare Annual Wellness (AWV)  04/14/2024   COVID-19 Vaccine (7 - Moderna risk  2024-25 season) 07/14/2024 (Originally 10/20/2023)   INFLUENZA VACCINE  10/13/2024 (Originally 02/14/2024)   Colonoscopy  12/22/2029   DTaP/Tdap/Td (4 - Td or Tdap) 02/09/2033   Pneumococcal Vaccine: 50+ Years  Completed   DEXA SCAN  Completed   Zoster Vaccines- Shingrix  Completed   HPV VACCINES  Aged Out   Meningococcal B Vaccine  Aged Out    Physical Exam: Vitals:   03/04/24 1102 03/04/24 1107  BP: (!) 140/80 136/80  Pulse: 78   Temp: 97.9 F (36.6 C)   SpO2: 94%   Weight: 142 lb 4.8 oz (64.5 kg)   Height: 5' 4 (1.626 m)    Body mass index is 24.43 kg/m. Physical Exam Vitals reviewed.  Constitutional:      Appearance: Normal appearance.  HENT:     Head: Normocephalic.     Nose: Nose normal.     Mouth/Throat:     Mouth: Mucous membranes are moist.     Pharynx: Oropharynx is clear.  Eyes:     Pupils: Pupils are equal, round, and reactive to light.  Cardiovascular:     Rate and Rhythm: Normal rate and regular rhythm.     Pulses: Normal pulses.     Heart sounds: Normal heart sounds. No murmur heard. Pulmonary:     Effort: Pulmonary effort is normal.     Breath sounds: Normal breath sounds.  Abdominal:     General: Abdomen is flat.  Bowel sounds are normal.     Palpations: Abdomen is soft.  Musculoskeletal:        General: No swelling.     Cervical back: Neck supple.  Skin:    General: Skin is warm.  Neurological:     General: No focal deficit present.     Mental Status: She is alert and oriented to person, place, and time.  Psychiatric:        Mood and Affect: Mood normal.        Thought Content: Thought content normal.     Labs reviewed: Basic Metabolic Panel: Recent Labs    05/21/23 1520 02/21/24 1422  NA  --  142  K  --  4.1  CL  --  106  CO2  --  25  GLUCOSE  --  110*  BUN  --  21  CREATININE  --  1.16*  CALCIUM  --  10.3  TSH 2.70  --    Liver Function Tests: No results for input(s): AST, ALT, ALKPHOS, BILITOT, PROT, ALBUMIN  in the last 8760 hours. No results for input(s): LIPASE, AMYLASE in the last 8760 hours. No results for input(s): AMMONIA in the last 8760 hours. CBC: Recent Labs    02/21/24 1422  WBC 6.0  NEUTROABS 3.2  HGB 12.2  HCT 37.4  MCV 91.7  PLT 227   Lipid Panel: No results for input(s): CHOL, HDL, LDLCALC, TRIG, CHOLHDL, LDLDIRECT in the last 8760 hours. Lab Results  Component Value Date   HGBA1C 5.6 09/14/2021    Procedures since last visit: CT Head Wo Contrast Result Date: 02/21/2024 CLINICAL DATA:  Provided history: Headache, new onset. Additional history provided: The patient reports a right eye injection yesterday with right eye pain today. EXAM: CT HEAD WITHOUT CONTRAST. CT ORBITS WITH CONTRAST. TECHNIQUE: Contiguous axial images were obtained from the base of the skull through the vertex with without contrast. Multidetector CT imaging of the orbits was performed using the standard protocol with intravenous contrast. RADIATION DOSE REDUCTION: This exam was performed according to  the departmental dose-optimization program which includes automated exposure control, adjustment of the mA and/or kV according to patient size and/or use of  iterative reconstruction technique. CONTRAST:  75mL OMNIPAQUE  IOHEXOL  300 MG/ML  SOLN COMPARISON:  Brain MRI 04/01/2020.  MRI orbits 08/13/2016. FINDINGS: CT HEAD FINDINGS Brain: Mild generalized parenchymal atrophy. Mild cerebral white matter disease, better appreciated on the prior MRI of 04/01/2020. There is no acute intracranial hemorrhage. No demarcated cortical infarct. No extra-axial fluid collection. No evidence of an intracranial mass. No midline shift. Vascular: No hyperdense vessel.  Atherosclerotic calcifications. Skull: No calvarial fracture or aggressive osseous lesion. CT ORBITS FINDINGS Orbits: Two small foci of gas along the anterior aspect of the right globe (for instance as seen on series 2, image 21). Prior bilateral ocular lens replacement. The globes are normal in size and contour. The extraocular muscles, optic nerve sheath complexes and lacrimal glands are symmetric and unremarkable. No orbital mass or pathologic orbital enhancement. Visualized sinuses: Mild mucosal thickening within the bilateral sphenoid sinuses. Soft tissues: Unremarkable appearance of the periorbital soft tissues. 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 IMPRESSION: CT head: 1.  No evidence of an acute intracranial abnormality. 2. Parenchymal atrophy and chronic small vessel ischemic disease. CT orbits: 1. Two small foci of gas along the anterior aspect of the right globe, possibly related to the reported recent injection. Alternatively, this may reflect a small amount of air beneath the right eyelid. 2. Prior bilateral ocular lens replacement. 3. Otherwise unremarkable CT appearance of the orbits. 4. Mild mucosal thickening within the bilateral sphenoid sinuses. 5. 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 Please refer to this prior examination for further description. Electronically  Signed   By: Rockey Childs D.O.   On: 02/21/2024 15:43   CT Orbits W Contrast Result Date: 02/21/2024 CLINICAL DATA:  Provided history: Headache, new onset. Additional history provided: The patient reports a right eye injection yesterday with right eye pain today. EXAM: CT HEAD WITHOUT CONTRAST. CT ORBITS WITH CONTRAST. TECHNIQUE: Contiguous axial images were obtained from the base of the skull through the vertex with without contrast. Multidetector CT imaging of the orbits was performed using the standard protocol with intravenous contrast. RADIATION DOSE REDUCTION: This exam was performed according to the departmental dose-optimization program which includes automated exposure control, adjustment of the mA and/or kV according to patient size and/or use of iterative reconstruction technique. CONTRAST:  75mL OMNIPAQUE  IOHEXOL  300 MG/ML  SOLN COMPARISON:  Brain MRI 04/01/2020.  MRI orbits 08/13/2016. FINDINGS: CT HEAD FINDINGS Brain: Mild generalized parenchymal atrophy. Mild cerebral white matter disease, better appreciated on the prior MRI of 04/01/2020. There is no acute intracranial hemorrhage. No demarcated cortical infarct. No extra-axial fluid collection. No evidence of an intracranial mass. No midline shift. Vascular: No hyperdense vessel.  Atherosclerotic calcifications. Skull: No calvarial fracture or aggressive osseous lesion. CT ORBITS FINDINGS Orbits: Two small foci of gas along the anterior aspect of the right globe (for instance as seen on series 2, image 21). Prior bilateral ocular lens replacement. The globes are normal in size and contour. The extraocular muscles, optic nerve sheath complexes and lacrimal glands are symmetric and unremarkable. No orbital mass or pathologic orbital enhancement. Visualized sinuses: Mild mucosal thickening within the bilateral sphenoid sinuses. Soft tissues: Unremarkable appearance of the periorbital soft tissues. 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 IMPRESSION: CT head: 1.  No evidence of an acute intracranial abnormality. 2. Parenchymal atrophy and chronic small vessel ischemic disease. CT orbits: 1. Two small foci of gas along the anterior aspect of the right globe, possibly related to the reported recent injection. Alternatively, this may reflect a small amount of air beneath the right eyelid. 2. Prior bilateral ocular lens replacement. 3. Otherwise unremarkable CT appearance of the orbits. 4. Mild mucosal thickening within the bilateral sphenoid sinuses. 5. 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 Please refer to this prior examination for further description. Electronically Signed   By: Rockey Childs D.O.   On: 02/21/2024 15:43    Assessment/Plan 1. Essential hypertension (Primary) BP some elevated but she has been more Stress recently due to her Vision She will monitor her BP and will let me know if it is consitent more then 140 - Lipid panel - COMPLETE METABOLIC PANEL WITHOUT GFR - CBC with Differential/Platelet  2. Moderate episode of recurrent major depressive disorder (HCC) Off Ativan   Continue Pritiq  3. Recurrent UTI Sees Dr Mat Irvine Boric acid Suppo, Estradiol, Macrodantin  PRN, Cranberry  4. Essential tremor   5 Low back pain Off Meloxicam  6Osteoporosis without current pathological fracture, unspecified osteoporosis type T Score stable as she was on Tamoxifen  Also very active and does exercise Follows with Dr Vianne Repeat DEXA pending now that she is off tamoxifen     Labs/tests ordered:  * No order type specified * Next appt:  Visit date not found

## 2024-03-13 DIAGNOSIS — M9902 Segmental and somatic dysfunction of thoracic region: Secondary | ICD-10-CM | POA: Diagnosis not present

## 2024-03-13 DIAGNOSIS — M9904 Segmental and somatic dysfunction of sacral region: Secondary | ICD-10-CM | POA: Diagnosis not present

## 2024-03-13 DIAGNOSIS — M9901 Segmental and somatic dysfunction of cervical region: Secondary | ICD-10-CM | POA: Diagnosis not present

## 2024-03-13 DIAGNOSIS — M9903 Segmental and somatic dysfunction of lumbar region: Secondary | ICD-10-CM | POA: Diagnosis not present

## 2024-03-30 DIAGNOSIS — H04123 Dry eye syndrome of bilateral lacrimal glands: Secondary | ICD-10-CM | POA: Diagnosis not present

## 2024-03-30 DIAGNOSIS — H353122 Nonexudative age-related macular degeneration, left eye, intermediate dry stage: Secondary | ICD-10-CM | POA: Diagnosis not present

## 2024-03-30 DIAGNOSIS — H353132 Nonexudative age-related macular degeneration, bilateral, intermediate dry stage: Secondary | ICD-10-CM | POA: Diagnosis not present

## 2024-03-30 DIAGNOSIS — H353211 Exudative age-related macular degeneration, right eye, with active choroidal neovascularization: Secondary | ICD-10-CM | POA: Diagnosis not present

## 2024-04-07 DIAGNOSIS — H3561 Retinal hemorrhage, right eye: Secondary | ICD-10-CM | POA: Diagnosis not present

## 2024-04-07 DIAGNOSIS — H353211 Exudative age-related macular degeneration, right eye, with active choroidal neovascularization: Secondary | ICD-10-CM | POA: Diagnosis not present

## 2024-04-07 DIAGNOSIS — H353122 Nonexudative age-related macular degeneration, left eye, intermediate dry stage: Secondary | ICD-10-CM | POA: Diagnosis not present

## 2024-04-07 DIAGNOSIS — H04123 Dry eye syndrome of bilateral lacrimal glands: Secondary | ICD-10-CM | POA: Diagnosis not present

## 2024-04-24 DIAGNOSIS — M9901 Segmental and somatic dysfunction of cervical region: Secondary | ICD-10-CM | POA: Diagnosis not present

## 2024-04-24 DIAGNOSIS — M9902 Segmental and somatic dysfunction of thoracic region: Secondary | ICD-10-CM | POA: Diagnosis not present

## 2024-04-24 DIAGNOSIS — M9903 Segmental and somatic dysfunction of lumbar region: Secondary | ICD-10-CM | POA: Diagnosis not present

## 2024-04-24 DIAGNOSIS — M9904 Segmental and somatic dysfunction of sacral region: Secondary | ICD-10-CM | POA: Diagnosis not present

## 2024-04-30 DIAGNOSIS — H04123 Dry eye syndrome of bilateral lacrimal glands: Secondary | ICD-10-CM | POA: Diagnosis not present

## 2024-04-30 DIAGNOSIS — H353122 Nonexudative age-related macular degeneration, left eye, intermediate dry stage: Secondary | ICD-10-CM | POA: Diagnosis not present

## 2024-04-30 DIAGNOSIS — H353132 Nonexudative age-related macular degeneration, bilateral, intermediate dry stage: Secondary | ICD-10-CM | POA: Diagnosis not present

## 2024-04-30 DIAGNOSIS — H353211 Exudative age-related macular degeneration, right eye, with active choroidal neovascularization: Secondary | ICD-10-CM | POA: Diagnosis not present

## 2024-05-20 NOTE — Progress Notes (Signed)
 [facility]                                          MRN: [id number]   05/20/2024   The [facility] Quality Team Specialist reviewed this patient medical record for the purposes of chart review for care gap closure. The following were reviewed: abstraction for care gap closure-controlling blood pressure.    [facility] Quality Team

## 2024-05-21 ENCOUNTER — Ambulatory Visit: Payer: Medicare PPO | Admitting: Internal Medicine

## 2024-05-29 DIAGNOSIS — M9902 Segmental and somatic dysfunction of thoracic region: Secondary | ICD-10-CM | POA: Diagnosis not present

## 2024-05-29 DIAGNOSIS — M9903 Segmental and somatic dysfunction of lumbar region: Secondary | ICD-10-CM | POA: Diagnosis not present

## 2024-05-29 DIAGNOSIS — M9901 Segmental and somatic dysfunction of cervical region: Secondary | ICD-10-CM | POA: Diagnosis not present

## 2024-05-29 DIAGNOSIS — M9904 Segmental and somatic dysfunction of sacral region: Secondary | ICD-10-CM | POA: Diagnosis not present

## 2024-06-01 DIAGNOSIS — H353122 Nonexudative age-related macular degeneration, left eye, intermediate dry stage: Secondary | ICD-10-CM | POA: Diagnosis not present

## 2024-06-01 DIAGNOSIS — H353132 Nonexudative age-related macular degeneration, bilateral, intermediate dry stage: Secondary | ICD-10-CM | POA: Diagnosis not present

## 2024-06-01 DIAGNOSIS — H3561 Retinal hemorrhage, right eye: Secondary | ICD-10-CM | POA: Diagnosis not present

## 2024-06-01 DIAGNOSIS — H04123 Dry eye syndrome of bilateral lacrimal glands: Secondary | ICD-10-CM | POA: Diagnosis not present

## 2024-06-01 DIAGNOSIS — H353211 Exudative age-related macular degeneration, right eye, with active choroidal neovascularization: Secondary | ICD-10-CM | POA: Diagnosis not present

## 2024-06-01 DIAGNOSIS — H35721 Serous detachment of retinal pigment epithelium, right eye: Secondary | ICD-10-CM | POA: Diagnosis not present

## 2024-06-05 ENCOUNTER — Ambulatory Visit: Admitting: Internal Medicine

## 2024-06-08 DIAGNOSIS — Z1283 Encounter for screening for malignant neoplasm of skin: Secondary | ICD-10-CM | POA: Diagnosis not present

## 2024-06-08 DIAGNOSIS — D225 Melanocytic nevi of trunk: Secondary | ICD-10-CM | POA: Diagnosis not present

## 2024-06-16 ENCOUNTER — Telehealth: Payer: Self-pay

## 2024-06-16 ENCOUNTER — Other Ambulatory Visit: Payer: Self-pay | Admitting: Internal Medicine

## 2024-06-16 DIAGNOSIS — M810 Age-related osteoporosis without current pathological fracture: Secondary | ICD-10-CM

## 2024-06-16 NOTE — Telephone Encounter (Signed)
 Patient requesting bone density ordered. Please place new order. Thanks.  We can reach the facility at [phone]

## 2024-06-17 NOTE — Telephone Encounter (Signed)
 Patient was called and left a detailed message to call the [facility] to get bone density scan done. Was advise to contact our office to schedule a follow up appt.

## 2024-06-22 DIAGNOSIS — H04123 Dry eye syndrome of bilateral lacrimal glands: Secondary | ICD-10-CM | POA: Diagnosis not present

## 2024-06-22 DIAGNOSIS — H353122 Nonexudative age-related macular degeneration, left eye, intermediate dry stage: Secondary | ICD-10-CM | POA: Diagnosis not present

## 2024-06-22 DIAGNOSIS — H524 Presbyopia: Secondary | ICD-10-CM | POA: Diagnosis not present

## 2024-06-22 DIAGNOSIS — H353211 Exudative age-related macular degeneration, right eye, with active choroidal neovascularization: Secondary | ICD-10-CM | POA: Diagnosis not present

## 2024-06-24 ENCOUNTER — Inpatient Hospital Stay: Admission: RE | Admit: 2024-06-24 | Discharge: 2024-06-24 | Attending: Internal Medicine | Admitting: Internal Medicine

## 2024-06-24 DIAGNOSIS — M810 Age-related osteoporosis without current pathological fracture: Secondary | ICD-10-CM | POA: Diagnosis not present

## 2024-06-25 ENCOUNTER — Ambulatory Visit: Payer: Self-pay | Admitting: Internal Medicine

## 2024-07-14 ENCOUNTER — Other Ambulatory Visit: Payer: Self-pay | Admitting: Orthopedic Surgery

## 2024-07-14 NOTE — Telephone Encounter (Signed)
 Please advise if this medication is long term, if so can we provide a year supply

## 2024-07-21 ENCOUNTER — Encounter: Payer: Self-pay | Admitting: Internal Medicine

## 2024-07-21 ENCOUNTER — Other Ambulatory Visit

## 2024-07-21 ENCOUNTER — Ambulatory Visit: Admitting: Internal Medicine

## 2024-07-21 VITALS — BP 120/70 | HR 96 | Ht 64.0 in | Wt 147.0 lb

## 2024-07-21 DIAGNOSIS — E559 Vitamin D deficiency, unspecified: Secondary | ICD-10-CM | POA: Diagnosis not present

## 2024-07-21 DIAGNOSIS — E063 Autoimmune thyroiditis: Secondary | ICD-10-CM

## 2024-07-21 DIAGNOSIS — M810 Age-related osteoporosis without current pathological fracture: Secondary | ICD-10-CM | POA: Diagnosis not present

## 2024-07-21 NOTE — Progress Notes (Signed)
 Patient ID: Lisa Yoder, female   DOB: Mar 28, 1951, 74 y.o.   MRN: [id number]  HPI  Lisa Yoder is a 74 y.o.-year-old female, returning for for new diagnosis of Hashimoto's hypothyroidism and osteoporosis.  Last visit 1 year and 2 months ago. ObGyn: Dr. Rosaline Cobble.  Interim history: She has R SI pain - has a belt. Tried yoga, massage, PT, chiropractor.  She continues seeing the chiropractor feels that her back pain is better. She sees orthopedics - had steroid inj in the past. No injections recently No falls or fractures.  She denies dizziness, vertigo, orthostasis. She sees Dr Elner for macular degeneration - getting inj in R eye (wet mac deg.)and red light in L eye (dry mac deg.). She continues to reside in [facility].  Reviewed and addended history: Osteoporosis: - dx'ed in 2006.  Reviewed patient's DXA scan reports:  Results:  DXA 06/24/2024 ([place]) Lumbar spine L1-L4( L2) Femoral neck (FN)  T-score   -2.2 RFN: -2.7 LFN: -3.1  Change in BMD from previous DXA test (%) Up 4.6%* Up 2.1%  (*) statistically significant   DXA 03/26/2022 ([place])  Lumbar spine L1-L4(L2) Femoral neck (FN)  T-score   -2.5 RFN: -3.0 LFN: -3.0  Change in BMD from previous DXA test (%) Down 1.4% Down 2.1%  (*) statistically significant   DXA 03/16/2020 ([place]) Lumbar spine L1-L4(L3) Femoral neck (FN)  T-score   -2.7 RFN: -2.9 LFN: -2.9  Change in BMD from previous DXA test (%) Up 9.3%* Up 1.3%  (*) statistically significant  Previously: Date L1-L4 T score FN T score  05/28/2017 ([place]) (L3): -3.3 (-0.9%) LFN: -2.8 (-2.2%*) RFN: -3.1  05/27/2015 ([place]) -3.0 LFN: -2.7 RFN: -3.1  05/25/2013 ([facility]) -3.5 (-10.4%* c/w 03/14/2009) LFN: -3.4 RFN: -3.5  03/14/2009 -2.8   03/03/2007 -2.7   04/13/2005 -2.5    She was on the following osteoporosis treatments: - Fosamax in 2006 >> for 1-2 years. - Actonel >> for 2 more years.  She previously refused Prolia but  then agreed to eat, however, in 05/2022, it was denied by her insurance.  I suggested Reclast, but she did not start this. She came off tamoxifen  07/2023.  + history of vitamin D  deficiency.  Reviewed recent vitamin D  levels: Lab Results  Component Value Date   VD25OH 38.26 05/21/2023   VD25OH 47.12 05/22/2022   VD25OH 40.61 11/16/2021   VD25OH 43 10/02/2019   VD25OH 31 05/20/2019   VD25OH 37.68 10/22/2017   VD25OH 31.59 08/28/2016   VD25OH 43.57 09/02/2015   VD25OH 42 10/08/2014  In the past, she was on 5000 units vitamin D  daily, but as vitamin D  level was close to the lower limit of normal, I advised her to increase to 7000 units daily >> then 2000 units daily and then we increased to 4000 units daily. This is also followed by Dr. Charlanne >> dose decreased to 2000 units.  She does weightbearing exercises - bands and weights. Also yoga 3x a week and walking  She is not on high vitamin A doses.  No history of sustained hypo or hypercalcemia.  No hyperparathyroidism.  No kidney stones. Lab Results  Component Value Date   CALCIUM 10.3 02/21/2024   CALCIUM 9.4 02/28/2023   CALCIUM 9.2 08/30/2022   CALCIUM 9.5 05/22/2022   CALCIUM 9.5 09/14/2021   CALCIUM 9.4 01/18/2021   CALCIUM 9.5 06/15/2020   CALCIUM 10.0 01/28/2020   CALCIUM 9.2 10/02/2019   CALCIUM 9.8 05/20/2019   No history  of CKD.  Latest creatinine was slightly high: Lab Results  Component Value Date   BUN 21 02/21/2024   CREATININE 1.16 (H) 02/21/2024   Menopause was at 74 years old.  She does have a family history of osteoporosis in her sister, who is on Forteo.  Hashimoto's thyroiditis:  Lab Results  Component Value Date   TSH 2.70 05/21/2023   TSH 3.25 05/22/2022   TSH 5.27 11/16/2021   TSH 6.06 (H) 10/02/2021   TSH 5.56 (H) 09/14/2021   Lab Results  Component Value Date   FREET4 0.62 05/22/2022   FREET4 0.63 11/16/2021   T3FREE 2.4 11/16/2021   Total T4 level was normal on  10/02/2021.  Component     Latest Ref Rng 10/02/2021  Thyroglobulin Ab     < or = 1 IU/mL 1   Thyroperoxidase Ab SerPl-aCnc     <9 IU/mL 434 (H)     Pt denies: - feeling nodules in neck - hoarseness - choking She has dysphagia with dry food - chronic. She has a history of a small hiatal hernia.  No FH of thyroid  disease or thyroid  cancer. No h/o radiation tx to head or neck. No seaweed or kelp. No recent contrast studies. No herbal supplements. No Biotin use. No recent steroids use.   She will come off Tamoxifen  - 07/2023.  [facility] resides in the [facility].   ROS: + see HPI  I reviewed pt's medications, allergies, PMH, social hx, family hx, and changes were documented in the history of present illness. Otherwise, unchanged from my initial visit note.  Past Medical History:  Diagnosis Date   Anxiety    Arthritis    back (06/05/2018)   Breast cancer, right breast (HCC) 03/2018   Chronic lower back pain    if I don't do yoga (06/05/2018)   Depression    Family history of breast cancer    GERD (gastroesophageal reflux disease)    Herpes simplex    on nose   History of hiatal hernia    Macular degeneration    Per [facility] New Patient Packet    Nephritis    as a child (06/05/2018)   Osteoporosis    SCC (squamous cell carcinoma)    under my nose (06/05/2018)   Strabismus    Per [facility] new patient packet    UTI (urinary tract infection) 8/15   Wears glasses    Past Surgical History:  Procedure Laterality Date   BREAST BIOPSY Right 04/2018   CATARACT EXTRACTION W/ INTRAOCULAR LENS  IMPLANT, BILATERAL Bilateral    CESAREAN SECTION  1977   COLONOSCOPY     CYST REMOVAL NECK Left 03/03/2014   Procedure: LEFT NECK CYST EXCISION;  Surgeon: Donnice KATHEE Lunger, MD;  Location: [facility];  Service: General;  Laterality: Left;   DILATION AND CURETTAGE OF UTERUS     EYE SURGERY     Strabismus   FINGER SURGERY Left 02/2023   MASTECTOMY Left  06/05/2018   MASTECTOMY COMPLETE / SIMPLE W/ SENTINEL NODE BIOPSY Right 06/05/2018   AND BLUE DYE INJECTION RIGHT BREAST   MASTECTOMY W/ SENTINEL NODE BIOPSY Bilateral 06/05/2018   Procedure: BILATERAL MASTECTOMIES WITH RIGHT SENTINEL LYMPH NODE BIOPSY AND BLUE DYE INJECTION RIGHT BREAST;  Surgeon: Belinda Donnice, MD;  Location: [facility];  Service: General;  Laterality: Bilateral;   SQUAMOUS CELL CARCINOMA EXCISION     under my nose   STRABISMUS SURGERY Bilateral 05/02/2018   Procedure: BILATERAL REPAIR  STRABISMUS;  Surgeon: Tobie Factor, MD;  Location: [facility];  Service: Ophthalmology;  Laterality: Bilateral;   TONSILLECTOMY AND ADENOIDECTOMY     UPPER GI ENDOSCOPY     WISDOM TOOTH EXTRACTION     History   Social History   Marital Status: Married    Spouse Name: N/A   Number of Children: 2   Occupational History    warehouse manager   Social History Main Topics   Smoking status: Former Smoker    Types: Cigarettes    Quit date:  1986    Smokeless tobacco: Never Used   Alcohol Use: Yes   Drug Use: No   Current Outpatient Medications on File Prior to Visit  Medication Sig Dispense Refill   Boric Acid 600 MG SUPP 600 mg.     cholecalciferol (VITAMIN D3) 25 MCG (1000 UNIT) tablet Take 1 tablet (1,000 Units total) by mouth daily.     Cranberry (ELLURA PO) Take 1 tablet by mouth daily.     desvenlafaxine  (PRISTIQ ) 50 MG 24 hr tablet Take 1 tablet (50 mg total) by mouth daily. 30 tablet 3   estradiol (ESTRACE) 0.1 MG/GM vaginal cream Place 1 Applicatorful vaginally 2 (two) times a week.     fluticasone  (VERAMYST) 27.5 MCG/SPRAY nasal spray Place 1 spray into the nose as needed for rhinitis.     Hypromellose (ARTIFICIAL TEARS OP) Place 1 drop into both eyes daily in the afternoon.     losartan  (COZAAR ) 25 MG tablet Take 1 tablet (25 mg total) by mouth every evening. 90 tablet 3   losartan  (COZAAR ) 50 MG tablet TAKE ONE TABLET BY MOUTH DAILY 90 tablet 1    Multiple Vitamins-Minerals (PRESERVISION AREDS) CAPS Take 1 capsule by mouth 2 (two) times daily.      nitrofurantoin  (MACRODANTIN ) 50 MG capsule TAKE ONE CAPSULE BY MOUTH DAILY AS NEEDED 30 capsule 5   NON FORMULARY Take 1 capsule by mouth 2 (two) times daily. Bio-Complete 3     NON FORMULARY Symplex F 2 per day strengthen bone     Omega-3 Fatty Acids (OMEGA 3 PO) Take 1 capsule by mouth daily.      Turmeric (QC TUMERIC COMPLEX PO) Take 5 mLs by mouth daily.     UNABLE TO FIND Take 2 capsules by mouth daily. Med Name: MAG R&R     UNABLE TO FIND Take 2 tablets by mouth daily. Med Name: LEPTICELL     UNABLE TO FIND Take 1 tablet by mouth daily. Med Name: FREEMAN     No current facility-administered medications on file prior to visit.   Allergies  Allergen Reactions   Bactrim [Sulfamethoxazole-Trimethoprim] Anaphylaxis, Hives and Rash   Betadine [Povidone Iodine] Swelling    Burning and irritation   Benzocaine Rash   Family History  Problem Relation Age of Onset   Breast cancer Mother 58   Hypertension Father    Mental illness Sister    Osteoporosis Sister    Seizures Sister    Suicidality Sister    Cancer Maternal Grandfather        blood cancer?   Breast cancer Cousin        dx under 50   Cancer Paternal Aunt        type unk   Cancer Other        type unk   Cancer Son    PE: BP 120/70   Pulse 96   Ht 5' 4 (1.626 m)   Wt  147 lb (66.7 kg)   SpO2 93%   BMI 25.23 kg/m   Wt Readings from Last 10 Encounters:  07/21/24 147 lb (66.7 kg)  03/04/24 142 lb 4.8 oz (64.5 kg)  02/03/24 142 lb 4.8 oz (64.5 kg)  09/11/23 139 lb 1.6 oz (63.1 kg)  05/21/23 140 lb 9.6 oz (63.8 kg)  04/15/23 140 lb 4.8 oz (63.6 kg)  03/06/23 141 lb 1.6 oz (64 kg)  02/10/23 140 lb (63.5 kg)  01/10/23 140 lb 14.4 oz (63.9 kg)  09/05/22 138 lb 12.8 oz (63 kg)   Constitutional: normal weight, in NAD Eyes:  EOMI, no exophthalmos ENT: no neck masses, no cervical lymphadenopathy Cardiovascular:  tachycardia, RR, No MRG Respiratory: CTA B Musculoskeletal: no deformities Skin:no rashes Neurological: + Head tremor and tremor with outstretched hands (essential tremor)  Assessment: Hashimoto's hypothyroidism  2. Osteoporosis  3.  History of vitamin D  deficiency  Plan: 1.  Hashimoto's hypothyroidism - She has a history of elevated TPO antibodies but her thyroid  function tests were not abnormal enough to mandate treatment. - Latest TSH was normal: Lab Results  Component Value Date   TSH 2.70 05/21/2023  - We discussed about indications for treatment of subclinical hypothyroidism, in case her TSH returns elevated today while free thyroid  hormones are normal, as she previously had:  Desire for pregnancy TSH higher than 10 TSH lower than 10 with signs or symptoms consistent with hypothyroidism  -We previously discussed about how to take levothyroxine correctly in case we need to start: Every day, with water, at least 30 minutes before breakfast, separated by at least 4 hours from acid reflux medications, multivitamins, calcium, iron - we we will recheck her TSH today -I we will see her back in 1 year  2. Osteoporosis - Likely postmenopausal/age-related - No falls or fractures since last visit  - Her bone density worsened after stopping bisphosphonate (Actonel) in the past.  She was tolerating this well.   - Reviewing her bone density report from 2021: Her spine T-score was much improved and the right femoral T-score was also improved.  In 2021, she was diagnosed with breast cancer and started tamoxifen .  We discussed that this was actually beneficial for the bones as opposed to aromatase inhibitors, which were detrimental.  Since the T-scores were still quite low, I suggested on the osteoporotic medications.  I did not suggest a bisphosphonate (she was on Actonel in the past) due to hiatal hernia and history of dysphagia.  I recommended either Prolia or Reclast.  Prolia was declined by  her insurance so we decided to try Reclast but she ended up not starting it.  She came off tamoxifen  in 07/2023 and wanted to see the next bone density before agreeing to start treatment.  She just had another bone density scan in 06/2024 and that showed improvement at the spine and hips, but the scores are still low, in the osteoporotic range, qualifying for the need for treatment. - Plan to repeat another bone density scan in 2 years from the previous - Encouraged consistent weightbearing exercise.  She is doing a good job with [facility] at [facility].  We discussed about skeletal loading at [facility] but for now she is not interested in this as she feels she gets enough exercise for now. - Discussed about getting 1200 mg of calcium daily preferentially from the diet and keeping a normal vitamin D  level (see below)  3. H/o vit D def -She continues on 2000 units vitamin  D daily - Latest vitamin D  level was normal: Lab Results  Component Value Date   VD25OH 38.26 05/21/2023  - We will repeat the vit D level now  Orders Placed This Encounter  Procedures   TSH   VITAMIN D  25 Hydroxy (Vit-D Deficiency, Fractures)   Lela Fendt, MD PhD [facility] Endocr[facility]

## 2024-07-21 NOTE — Patient Instructions (Addendum)
 Please stop at the lab.  Please come back for a follow-up appointment in 1 year.

## 2024-07-22 ENCOUNTER — Ambulatory Visit: Payer: Self-pay | Admitting: Internal Medicine

## 2024-07-22 LAB — TSH: TSH: 3.07 m[IU]/L (ref 0.40–4.50)

## 2024-07-22 LAB — VITAMIN D 25 HYDROXY (VIT D DEFICIENCY, FRACTURES): Vit D, 25-Hydroxy: 37 ng/mL (ref 30–100)

## 2024-08-14 ENCOUNTER — Telehealth: Payer: Self-pay

## 2024-08-14 NOTE — Telephone Encounter (Signed)
 Spoke with patient and she will do her labs at [facility] on Monday August 18, 2024 at 7:45 am before her appointment on March 11,2026   Message sent to Charlanne Fredia CROME, MD

## 2024-08-14 NOTE — Telephone Encounter (Signed)
 Copied from CRM [id number]. Topic: Clinical - Request for Lab/Test Order >> Aug 14, 2024  9:39 AM Marda MATSU wrote: Reason for CRM:  Please call patient Every regarding lab work. She rescheduled her appointment and is inquiring about lab work  (order in system say 02/2024)   Please advise

## 2024-08-18 LAB — CBC WITH DIFFERENTIAL/PLATELET
Absolute Lymphocytes: 1871 {cells}/uL (ref 850–3900)
Absolute Monocytes: 663 {cells}/uL (ref 200–950)
Basophils Absolute: 42 {cells}/uL (ref 0–200)
Basophils Relative: 0.8 %
Eosinophils Absolute: 191 {cells}/uL (ref 15–500)
Eosinophils Relative: 3.6 %
HCT: 39.9 % (ref 35.9–46.0)
Hemoglobin: 12.8 g/dL (ref 11.7–15.5)
MCH: 28.8 pg (ref 27.0–33.0)
MCHC: 32.1 g/dL (ref 31.6–35.4)
MCV: 89.9 fL (ref 81.4–101.7)
MPV: 11.5 fL (ref 7.5–12.5)
Monocytes Relative: 12.5 %
Neutro Abs: 2533 {cells}/uL (ref 1500–7800)
Neutrophils Relative %: 47.8 %
Platelets: 240 10*3/uL (ref 140–400)
RBC: 4.44 Million/uL (ref 3.80–5.10)
RDW: 12.6 % (ref 11.0–15.0)
Total Lymphocyte: 35.3 %
WBC: 5.3 10*3/uL (ref 3.8–10.8)

## 2024-08-18 LAB — COMPLETE METABOLIC PANEL WITHOUT GFR
AG Ratio: 1.6 (calc) (ref 1.0–2.5)
ALT: 14 U/L (ref 6–29)
AST: 15 U/L (ref 10–35)
Albumin: 4.1 g/dL (ref 3.6–5.1)
Alkaline phosphatase (APISO): 89 U/L (ref 37–153)
BUN: 20 mg/dL (ref 7–25)
CO2: 28 mmol/L (ref 20–32)
Calcium: 9.7 mg/dL (ref 8.6–10.4)
Chloride: 105 mmol/L (ref 98–110)
Creat: 0.96 mg/dL (ref 0.60–1.00)
Globulin: 2.6 g/dL (ref 1.9–3.7)
Glucose, Bld: 85 mg/dL (ref 65–99)
Potassium: 4.6 mmol/L (ref 3.5–5.3)
Sodium: 141 mmol/L (ref 135–146)
Total Bilirubin: 0.5 mg/dL (ref 0.2–1.2)
Total Protein: 6.7 g/dL (ref 6.1–8.1)

## 2024-08-18 LAB — LIPID PANEL
Cholesterol: 205 mg/dL — ABNORMAL HIGH
HDL: 74 mg/dL
LDL Cholesterol (Calc): 110 mg/dL — ABNORMAL HIGH
Non-HDL Cholesterol (Calc): 131 mg/dL — ABNORMAL HIGH
Total CHOL/HDL Ratio: 2.8 (calc)
Triglycerides: 105 mg/dL

## 2024-09-02 ENCOUNTER — Encounter: Payer: Self-pay | Admitting: Internal Medicine

## 2024-09-23 ENCOUNTER — Encounter: Admitting: Internal Medicine

## 2024-09-24 ENCOUNTER — Encounter: Admitting: Nurse Practitioner

## 2025-07-20 ENCOUNTER — Ambulatory Visit: Admitting: Internal Medicine
